# Patient Record
Sex: Female | Born: 1935 | Race: Black or African American | Hispanic: No | Marital: Single | Smoking: Never smoker
Health system: Southern US, Community
[De-identification: ages and names within clinical notes are randomized; demographics above are authoritative.]

## PROBLEM LIST (undated history)

## (undated) DIAGNOSIS — M109 Gout, unspecified: Secondary | ICD-10-CM

## (undated) DIAGNOSIS — M1990 Unspecified osteoarthritis, unspecified site: Secondary | ICD-10-CM

## (undated) DIAGNOSIS — K219 Gastro-esophageal reflux disease without esophagitis: Secondary | ICD-10-CM

## (undated) DIAGNOSIS — N289 Disorder of kidney and ureter, unspecified: Secondary | ICD-10-CM

## (undated) DIAGNOSIS — I10 Essential (primary) hypertension: Secondary | ICD-10-CM

## (undated) DIAGNOSIS — N189 Chronic kidney disease, unspecified: Secondary | ICD-10-CM

## (undated) HISTORY — PX: NO PAST SURGERIES: SHX2092

---

## 2004-05-02 ENCOUNTER — Ambulatory Visit: Payer: Self-pay | Admitting: Internal Medicine

## 2004-05-06 ENCOUNTER — Ambulatory Visit: Payer: Self-pay | Admitting: Internal Medicine

## 2006-04-10 ENCOUNTER — Ambulatory Visit: Payer: Self-pay | Admitting: Ophthalmology

## 2006-04-16 ENCOUNTER — Ambulatory Visit: Payer: Self-pay | Admitting: Ophthalmology

## 2009-01-05 ENCOUNTER — Emergency Department: Payer: Self-pay | Admitting: Emergency Medicine

## 2010-03-10 ENCOUNTER — Emergency Department: Payer: Self-pay | Admitting: Emergency Medicine

## 2015-11-04 ENCOUNTER — Emergency Department
Admission: EM | Admit: 2015-11-04 | Discharge: 2015-11-04 | Disposition: A | Discharge status: Home / Self Care | Payer: Medicare Other | Attending: Emergency Medicine | Admitting: Emergency Medicine

## 2015-11-04 DIAGNOSIS — M1990 Unspecified osteoarthritis, unspecified site: Secondary | ICD-10-CM | POA: Diagnosis not present

## 2015-11-04 DIAGNOSIS — T783XXA Angioneurotic edema, initial encounter: Secondary | ICD-10-CM | POA: Diagnosis not present

## 2015-11-04 DIAGNOSIS — Z79899 Other long term (current) drug therapy: Secondary | ICD-10-CM | POA: Insufficient documentation

## 2015-11-04 DIAGNOSIS — Z7982 Long term (current) use of aspirin: Secondary | ICD-10-CM | POA: Diagnosis not present

## 2015-11-04 DIAGNOSIS — I10 Essential (primary) hypertension: Secondary | ICD-10-CM | POA: Diagnosis not present

## 2015-11-04 DIAGNOSIS — R220 Localized swelling, mass and lump, head: Secondary | ICD-10-CM | POA: Diagnosis present

## 2015-11-04 HISTORY — DX: Essential (primary) hypertension: I10

## 2015-11-04 HISTORY — DX: Unspecified osteoarthritis, unspecified site: M19.90

## 2015-11-04 HISTORY — DX: Disorder of kidney and ureter, unspecified: N28.9

## 2015-11-04 HISTORY — DX: Gastro-esophageal reflux disease without esophagitis: K21.9

## 2015-11-04 HISTORY — DX: Gout, unspecified: M10.9

## 2015-11-04 LAB — CBC WITH DIFFERENTIAL/PLATELET
Basophils Absolute: 0 10*3/uL (ref 0–0.1)
Basophils Relative: 0 %
EOS ABS: 0.1 10*3/uL (ref 0–0.7)
Eosinophils Relative: 1 %
HEMATOCRIT: 44.9 % (ref 35.0–47.0)
HEMOGLOBIN: 14.4 g/dL (ref 12.0–16.0)
LYMPHS ABS: 2 10*3/uL (ref 1.0–3.6)
LYMPHS PCT: 16 %
MCH: 25.3 pg — AB (ref 26.0–34.0)
MCHC: 32.1 g/dL (ref 32.0–36.0)
MCV: 78.8 fL — AB (ref 80.0–100.0)
MONOS PCT: 6 %
Monocytes Absolute: 0.8 10*3/uL (ref 0.2–0.9)
NEUTROS ABS: 9.6 10*3/uL — AB (ref 1.4–6.5)
NEUTROS PCT: 77 %
Platelets: 272 10*3/uL (ref 150–440)
RBC: 5.7 MIL/uL — AB (ref 3.80–5.20)
RDW: 16.1 % — ABNORMAL HIGH (ref 11.5–14.5)
WBC: 12.6 10*3/uL — AB (ref 3.6–11.0)

## 2015-11-04 LAB — BASIC METABOLIC PANEL
Anion gap: 7 (ref 5–15)
BUN: 22 mg/dL — ABNORMAL HIGH (ref 6–20)
CHLORIDE: 106 mmol/L (ref 101–111)
CO2: 28 mmol/L (ref 22–32)
CREATININE: 1.29 mg/dL — AB (ref 0.44–1.00)
Calcium: 9.7 mg/dL (ref 8.9–10.3)
GFR calc non Af Amer: 35 mL/min — ABNORMAL LOW (ref >60)
GFR, EST AFRICAN AMERICAN: 40 mL/min — AB (ref >60)
Glucose, Bld: 106 mg/dL — ABNORMAL HIGH (ref 65–99)
POTASSIUM: 4.7 mmol/L (ref 3.5–5.1)
Sodium: 141 mmol/L (ref 135–145)

## 2015-11-04 MED ORDER — FAMOTIDINE IN NACL 20-0.9 MG/50ML-% IV SOLN
20.0000 mg | Freq: Once | INTRAVENOUS | Status: AC
  Administered 2015-11-04: 20 mg via INTRAVENOUS
  Filled 2015-11-04: qty 50

## 2015-11-04 MED ORDER — CLONIDINE HCL 0.1 MG PO TABS
0.2000 mg | ORAL_TABLET | Freq: Once | ORAL | Status: AC
  Administered 2015-11-04: 0.2 mg via ORAL
  Filled 2015-11-04: qty 2

## 2015-11-04 MED ORDER — DIPHENHYDRAMINE HCL 50 MG/ML IJ SOLN
25.0000 mg | Freq: Once | INTRAMUSCULAR | Status: AC
  Administered 2015-11-04: 25 mg via INTRAVENOUS
  Filled 2015-11-04: qty 1

## 2015-11-04 MED ORDER — AMLODIPINE BESYLATE 2.5 MG PO TABS: 2.5000 mg | ORAL_TABLET | Freq: Every day | ORAL | Status: DC

## 2015-11-04 MED ORDER — METHYLPREDNISOLONE SODIUM SUCC 125 MG IJ SOLR
60.0000 mg | Freq: Once | INTRAMUSCULAR | Status: AC
  Administered 2015-11-04: 60 mg via INTRAVENOUS
  Filled 2015-11-04: qty 2

## 2015-11-04 MED ORDER — AMLODIPINE BESYLATE 5 MG PO TABS
5.0000 mg | ORAL_TABLET | Freq: Once | ORAL | Status: AC
  Administered 2015-11-04: 5 mg via ORAL
  Filled 2015-11-04: qty 1

## 2015-11-04 NOTE — ED Notes (Signed)
After PEPCID infusion the swelling the the chin and cheeks has decreased slightly.  Patient still denies any pain. No airway changes noted

## 2015-11-04 NOTE — ED Notes (Signed)
Pt c/o swelling from below the eyes down into the chin since this morning.. Denies any tongue and throat swelling. Pt is on lisinopril..Marland Kitchen

## 2015-11-04 NOTE — Discharge Instructions (Signed)
You were seen in the ER today for swelling of the lower jaw. This improved with observation in the emergency room. This may be related to your lisinopril medication. Stop taking lisinopril. Start taking amlodipine 2.5 mg once a day to help control your blood pressure. Follow up closely with your primary care doctor for continued management of your blood pressure during this transition and further evaluation of your angioedema swelling.  Angioedema Angioedema is a sudden swelling of tissues, often of the skin. It can occur on the face or genitals or in the abdomen or other body parts. The swelling usually develops over a short period and gets better in 24 to 48 hours. It often begins during the night and is found when the person wakes up. The person may also get red, itchy patches of skin (hives). Angioedema can be dangerous if it involves swelling of the air passages.  Depending on the cause, episodes of angioedema may only happen once, come back in unpredictable patterns, or repeat for several years and then gradually fade away.  CAUSES  Angioedema can be caused by an allergic reaction to various triggers. It can also result from nonallergic causes, including reactions to drugs, immune system disorders, viral infections, or an abnormal gene that is passed to you from your parents (hereditary). For some people with angioedema, the cause is unknown.  Some things that can trigger angioedema include:   Foods.   Medicines, such as ACE inhibitors, ARBs, nonsteroidal anti-inflammatory agents, or estrogen.   Latex.   Animal saliva.   Insect stings.   Dyes used in X-rays.   Mild injury.   Dental work.  Surgery.  Stress.   Sudden changes in temperature.   Exercise. SIGNS AND SYMPTOMS   Swelling of the skin.  Hives. If these are present, there is also intense itching.  Redness in the affected area.   Pain in the affected area.  Swollen lips or tongue.  Breathing problems.  This may happen if the air passages swell.  Wheezing. If internal organs are involved, there may be:   Nausea.   Abdominal pain.   Vomiting.   Difficulty swallowing.   Difficulty passing urine. DIAGNOSIS   Your health care provider will examine the affected area and take a medical and family history.  Various tests may be done to help determine the cause. Tests may include:  Allergy skin tests to see if the problem is an allergic reaction.   Blood tests to check for hereditary angioedema.   Tests to check for underlying diseases that could cause the condition.   A review of your medicines, including over-the-counter medicines, may be done. TREATMENT  Treatment will depend on the cause of the angioedema. Possible treatments include:   Removal of anything that triggered the condition (such as stopping certain medicines).   Medicines to treat symptoms or prevent attacks. Medicines given may include:   Antihistamines.   Epinephrine injection.   Steroids.   Hospitalization may be required for severe attacks. If the air passages are affected, it can be an emergency. Tubes may need to be placed to keep the airway open. HOME CARE INSTRUCTIONS   Take all medicines as directed by your health care provider.  If you were given medicines for emergency allergy treatment, always carry them with you.  Wear a medical bracelet as directed by your health care provider.   Avoid known triggers. SEEK MEDICAL CARE IF:   You have repeat attacks of angioedema.   Your attacks are  more frequent or more severe despite preventive measures.   You have hereditary angioedema and are considering having children. It is important to discuss with your health care provider the risks of passing the condition on to your children. SEEK IMMEDIATE MEDICAL CARE IF:   You have severe swelling of the mouth, tongue, or lips.  You have difficulty breathing.   You have difficulty  swallowing.   You faint. MAKE SURE YOU:  Understand these instructions.  Will watch your condition.  Will get help right away if you are not doing well or get worse.   This information is not intended to replace advice given to you by your health care provider. Make sure you discuss any questions you have with your health care provider.   Document Released: 09/11/2001 Document Revised: 07/24/2014 Document Reviewed: 02/24/2013 Elsevier Interactive Patient Education [employer]

## 2015-11-04 NOTE — ED Provider Notes (Signed)
[facility] Emergency Department Provider Note  ____________________________________________  Time seen: 1:30 PM  I have reviewed the triage vital signs and the nursing notes.   HISTORY  Chief Complaint Facial Swelling    HPI [name] is a 82 y.o. female who comes to the emergency department due to swelling of her cheeks and jaw bilaterally. Patient reports that the swelling was present when she woke up this morning but not there when she went to sleep last night. He is had multiple episodes like this before of swelling in the bilateral jaw. Denies any swelling anywhere else in the body. She does light take lisinopril for her blood pressure. Denies knowing of any similar episodes in family members. Unable to pinpoint any precipitating event that may have caused this. Denies trouble swallowing, denies shortness of breath. No tongue swelling.     Past Medical History  Diagnosis Date  . Hypertension   . Gout   . GERD (gastroesophageal reflux disease)   . Osteoarthritis   . Renal disorder      There are no active problems to display for this patient.    History reviewed. No pertinent past surgical history.   Current Outpatient Rx  Name  Route  Sig  Dispense  Refill  . allopurinol (ZYLOPRIM) 100 MG tablet   Oral   Take 100 mg by mouth daily.         Marland Kitchen. aspirin 81 MG tablet   Oral   Take 81 mg by mouth daily.         . cloNIDine (CATAPRES) 0.2 MG tablet   Oral   Take 0.2 mg by mouth 3 (three) times daily.         . felodipine (PLENDIL) 10 MG 24 hr tablet   Oral   Take 10 mg by mouth daily.         . metoprolol (LOPRESSOR) 100 MG tablet   Oral   Take 100 mg by mouth 2 (two) times daily.         . QUEtiapine (SEROQUEL) 25 MG tablet   Oral   Take 25 mg by mouth 2 (two) times daily.         Marland Kitchen. amLODipine (NORVASC) 2.5 MG tablet   Oral   Take 1 tablet (2.5 mg total) by mouth daily.   30 tablet   0       Allergies Review of patient's allergies indicates no known allergies.   No family history on file.  Social History Social History  Substance Use Topics  . Smoking status: Never Smoker   . Smokeless tobacco: None  . Alcohol Use: No    Review of Systems  Constitutional:   No fever or chills.  Eyes:   No vision changes.  ENT:   No sore throat. No rhinorrhea.Positive facial swelling as above Cardiovascular:   No chest pain. Respiratory:   No dyspnea or cough. Gastrointestinal:   Negative for abdominal pain, vomiting and diarrhea.  No bloody stool. Genitourinary:   Negative for dysuria or difficulty urinating. Musculoskeletal:   Negative for focal pain or swelling Neurological:   Negative for headaches 10-point ROS otherwise negative.  ____________________________________________   PHYSICAL EXAM:  VITAL SIGNS: ED Triage Vitals  Enc Vitals Group     BP 11/04/15 1258 101/74 mmHg     Pulse Rate 11/04/15 1258 58     Resp 11/04/15 1258 14     Temp 11/04/15 1258 98.1 F (36.7 C)  Temp Source 11/04/15 1258 Oral     SpO2 11/04/15 1258 97 %     Weight 11/04/15 1258 104 lb (47.174 kg)     Height 11/04/15 1258  (1.499 m)     Head Cir --      Peak Flow --      Pain Score --      Pain Loc --      Pain Edu? --      Excl. in GC? --     Vital signs reviewed, nursing assessments reviewed.   Constitutional:   Alert and oriented. Well appearing and in no distress. Eyes:   No scleral icterus. No conjunctival pallor. PERRL. EOMI ENT   Head:   Normocephalic and atraumatic.There is firm edema of the skin overlying the mandible bilaterally extending to the chin. It does not extend inferior to the chin. Does not involve the maxillary or superiorly bilaterally.   Nose:   No congestion/rhinnorhea. No septal hematoma   Mouth/Throat:   MMM, no pharyngeal erythema. No peritonsillar mass. No tongue swelling. Oropharynx normal. No uvula swelling. Floor of mouth is  soft and not elevated or edematous. No palatal edema.   Neck:   No stridor. No SubQ emphysema. No meningismus. Hematological/Lymphatic/Immunilogical:   No cervical lymphadenopathy. Cardiovascular:   RRR. Symmetric bilateral radial and DP pulses.  No murmurs.  Respiratory:   Normal respiratory effort without tachypnea nor retractions. Breath sounds are clear and equal bilaterally. No wheezes/rales/rhonchi. Gastrointestinal:   Soft and nontender. Non distended. There is no CVA tenderness.  No rebound, rigidity, or guarding. Genitourinary:   deferred Musculoskeletal:   Nontender with normal range of motion in all extremities. No joint effusions.  No lower extremity tenderness.  No edema. Neurologic:   Normal speech and language.  CN 2-10 normal. Motor grossly intact. No gross focal neurologic deficits are appreciated.  Skin:    Skin is warm, dry and intact. No rash noted.  No petechiae, purpura, or bullae.  ____________________________________________    LABS (pertinent positives/negatives) (all labs ordered are listed, but only abnormal results are displayed) Labs Reviewed  BASIC METABOLIC PANEL - Abnormal; Notable for the following:    Glucose, Bld 106 (*)    BUN 22 (*)    Creatinine, Ser 1.29 (*)    GFR calc non Af Amer 35 (*)    GFR calc Af Amer 40 (*)    All other components within normal limits  CBC WITH DIFFERENTIAL/PLATELET - Abnormal; Notable for the following:    WBC 12.6 (*)    RBC 5.70 (*)    MCV 78.8 (*)    MCH 25.3 (*)    RDW 16.1 (*)    Neutro Abs 9.6 (*)    All other components within normal limits   ____________________________________________   EKG    ____________________________________________    RADIOLOGY    ____________________________________________   PROCEDURES   ____________________________________________   INITIAL IMPRESSION / ASSESSMENT AND PLAN / ED COURSE  Pertinent labs & imaging results that were available during my care  of the patient were reviewed by me and considered in my medical decision making (see chart for details).  Patient presents with angioedema so lower jaw. No involvement of the oral cavity or throat by symptoms or exam. Patient is overall well appearing and calm and comfortable. We'll observe in the emergency department, give steroids and antihistamines. Possibly related to lisinopril versus C1 esterase deficiency or other problem and deficiency.  On reassessment at 4:30 PM, the  swelling has diminished significantly and is now soft. Patient feels much better as well. Counseled her to discontinue her lisinopril. I will replace this with a low-dose of amlodipine and have her follow up closely with primary care for continued management of blood pressure and further evaluation of her angioedema. She was markedly hypertensive with systolic blood pressure of about 210 in the emergency department. However, she takes clonidine 3 times a day at 8 AM 5 PM and 8 PM. With this schedule, it is not surprising that she would become very hypertensive in the middle of the day. We gave her amlodipine and clonidine in the ER. No evidence of acute adverse sequelae of the blood pressure.   ____________________________________________   FINAL CLINICAL IMPRESSION(S) / ED DIAGNOSES  Final diagnoses:  Angioedema, initial encounter       Portions of this note were generated with dragon dictation software. Dictation errors may occur despite best attempts at proofreading.   Sharman Cheek, MD 11/04/15 [phone]

## 2015-11-04 NOTE — ED Notes (Signed)
MD at bedside. 

## 2017-04-06 ENCOUNTER — Ambulatory Visit
Admission: RE | Admit: 2017-04-06 | Discharge: 2017-04-06 | Disposition: A | Discharge status: Home / Self Care | Payer: Medicare Other | Source: Ambulatory Visit | Attending: Surgery | Admitting: Surgery

## 2017-04-06 ENCOUNTER — Other Ambulatory Visit: Payer: Self-pay | Admitting: Surgery

## 2017-04-06 ENCOUNTER — Encounter: Payer: Medicare Other | Attending: Surgery | Admitting: Surgery

## 2017-04-06 DIAGNOSIS — X58XXXA Exposure to other specified factors, initial encounter: Secondary | ICD-10-CM | POA: Insufficient documentation

## 2017-04-06 DIAGNOSIS — B999 Unspecified infectious disease: Secondary | ICD-10-CM

## 2017-04-06 DIAGNOSIS — I739 Peripheral vascular disease, unspecified: Secondary | ICD-10-CM | POA: Diagnosis not present

## 2017-04-06 DIAGNOSIS — S91301A Unspecified open wound, right foot, initial encounter: Secondary | ICD-10-CM | POA: Diagnosis not present

## 2017-04-06 DIAGNOSIS — I10 Essential (primary) hypertension: Secondary | ICD-10-CM | POA: Diagnosis not present

## 2017-04-06 DIAGNOSIS — L97512 Non-pressure chronic ulcer of other part of right foot with fat layer exposed: Secondary | ICD-10-CM | POA: Diagnosis not present

## 2017-04-08 NOTE — Progress Notes (Signed)
[name], [name] ([id number]) Visit Report for 04/06/2017 Chief Complaint Document Details Patient Name: [name], [name] Date of Service: 04/06/2017 8:00 AM Medical Record Number: [id number] Patient Account Number: [id number] Date of Birth/Sex: [birth year] (83 y.o. Female) Treating RN: Curtis Sites Primary Care Provider: Dewaine Oats Other Clinician: Referring Provider: CLINE, TODD Treating Provider/Extender: Rudene Re in Treatment: 0 Information Obtained from: Patient Chief Complaint Patient seen for complaints of Non-Healing Wound to the right second toe which she's had for about 3 months Electronic Signature(s) Signed: 04/06/2017 4:09:30 PM By: Evlyn Kanner MD, FACS Entered By: Evlyn Kanner on 04/06/2017 09:10:43 Loren, Erlean L. ([id number]) -------------------------------------------------------------------------------- HPI Details Patient Name: Moraes, Elonda L. Date of Service: 04/06/2017 8:00 AM Medical Record Number: [id number] Patient Account Number: [id number] Date of Birth/Sex: [birth year] (83 y.o. Female) Treating RN: Curtis Sites Primary Care Provider: Dewaine Oats Other Clinician: Referring Provider: CLINE, TODD Treating Provider/Extender: Rudene Re in Treatment: 0 History of Present Illness Location: right second toe Quality: Patient reports No Pain. Severity: Patient states wound are getting worse. Duration: Patient has had the wound for > 3 months prior to seeking treatment at the wound center Context: The wound would happen gradually Modifying Factors: Other treatment(s) tried include:asked her to wash the foot, soak it in Epsom salt and apply some Bactroban and offload it. HPI Description: very pleasant 83 year old patient, no history of diabetes or smoking has had a ulceration on the right second toe dorsum for about 3 months. Besides essential hypertension and gout she does not have any other significant problems and has never been a  smoker. Electronic Signature(s) Signed: 04/06/2017 4:09:30 PM By: Evlyn Kanner MD, FACS Entered By: Evlyn Kanner on 04/06/2017 09:12:32 Eagleson, [name] ([id number]) -------------------------------------------------------------------------------- Physical Exam Details Patient Name: Futrell, Ketsia L. Date of Service: 04/06/2017 8:00 AM Medical Record Number: [id number] Patient Account Number: [id number] Date of Birth/Sex: Jan 27, 1921 (83 y.o. Female) Treating RN: Curtis Sites Primary Care Provider: Dewaine Oats Other Clinician: Referring Provider: CLINE, TODD Treating Provider/Extender: Rudene Re in Treatment: 0 Constitutional . Pulse regular. Respirations normal and unlabored. Afebrile. . Eyes Nonicteric. Reactive to light. Ears, Nose, Mouth, and Throat Lips, teeth, and gums WNL.Marland Kitchen Moist mucosa without lesions. Neck supple and nontender. No palpable supraclavicular or cervical adenopathy. Normal sized without goiter. Respiratory WNL. No retractions.. Cardiovascular Pedal Pulses week and no ABIs were measurable.. No clubbing, cyanosis or edema. Gastrointestinal (GI) Abdomen without masses or tenderness.. No liver or spleen enlargement or tenderness.. Lymphatic No adneopathy. No adenopathy. No adenopathy. Musculoskeletal Adexa without tenderness or enlargement.. Digits and nails w/o clubbing, cyanosis, infection, petechiae, ischemia, or inflammatory conditions.. Integumentary (Hair, Skin) No suspicious lesions. No crepitus or fluctuance. No peri-wound warmth or erythema. No masses.Marland Kitchen Psychiatric Judgement and insight Intact.. No evidence of depression, anxiety, or agitation.. Notes the patient has no evidence of cellulitis of her foot or toes and has a small open wound in the region of the dorsum of the second toe which is a bit disfigured. No sharp debridement was required and it does not probe down to bone Electronic Signature(s) Signed: 04/06/2017 4:09:30 PM By: Evlyn Kanner MD, FACS Entered By: Evlyn Kanner on 04/06/2017 09:13:36 Posey, [name] ([id number]) -------------------------------------------------------------------------------- Physician Orders Details Patient Name: Ayer, Layne L. Date of Service: 04/06/2017 8:00 AM Medical Record Number: [id number] Patient Account Number: [id number] Date of Birth/Sex: 07-Apr-1921 (83 y.o. Female) Treating RN: Curtis Sites Primary Care Provider: Dewaine Oats Other Clinician: Referring Provider: CLINE, TODD Treating Provider/Extender: Rudene Re in Treatment: 0  Verbal / Phone Orders: No Diagnosis Coding ICD-10 Coding Code Description L97.512 Non-pressure chronic ulcer of other part of right foot with fat layer exposed I73.9 Peripheral vascular disease, unspecified I10 Essential (primary) hypertension Wound Cleansing Wound #1 Right Toe Second o Clean wound with Normal Saline. Anesthetic Wound #1 Right Toe Second o Topical Lidocaine 4% cream applied to wound bed prior to debridement Primary Wound Dressing Wound #1 Right Toe Second o Prisma Ag Secondary Dressing Wound #1 Right Toe Second o Gauze and Kerlix/Conform - secure lightly with tape or coban Dressing Change Frequency Wound #1 Right Toe Second o Other: - change the bandage on shower days or if soiled Follow-up Appointments Wound #1 Right Toe Second o Return Appointment in 1 week. Off-Loading Wound #1 Right Toe Second o Other: - continue wearing shoes that do not rub the top of the toe Yeater, Veronika L. ([id number]) Additional Orders / Instructions Wound #1 Right Toe Second o Increase protein intake. o Other: - Please add vitamin A, vitamin C and zinc supplements to your diet Radiology o X-ray, foot - right foot Electronic Signature(s) Signed: 04/06/2017 4:09:30 PM By: Evlyn Kanner MD, FACS Signed: 04/06/2017 5:20:28 PM By: Curtis Sites Entered By: Curtis Sites on 04/06/2017 09:12:45 Aoun, Florice L.  ([id number]) -------------------------------------------------------------------------------- Problem List Details Patient Name: Bebo, Raivyn L. Date of Service: 04/06/2017 8:00 AM Medical Record Number: [id number] Patient Account Number: [id number] Date of Birth/Sex: 1921/01/07 (83 y.o. Female) Treating RN: Curtis Sites Primary Care Provider: Dewaine Oats Other Clinician: Referring Provider: CLINE, TODD Treating Provider/Extender: Rudene Re in Treatment: 0 Active Problems ICD-10 Encounter Code Description Active Date Diagnosis L97.512 Non-pressure chronic ulcer of other part of right foot with 04/06/2017 Yes fat layer exposed I73.9 Peripheral vascular disease, unspecified 04/06/2017 Yes I10 Essential (primary) hypertension 04/06/2017 Yes Inactive Problems Resolved Problems Electronic Signature(s) Signed: 04/06/2017 4:09:30 PM By: Evlyn Kanner MD, FACS Entered By: Evlyn Kanner on 04/06/2017 09:10:06 Duke, Missie L. ([id number]) -------------------------------------------------------------------------------- Progress Note Details Patient Name: Bommarito, Elianys L. Date of Service: 04/06/2017 8:00 AM Medical Record Number: [id number] Patient Account Number: [id number] Date of Birth/Sex: [birth year] (83 y.o. Female) Treating RN: Curtis Sites Primary Care Provider: Dewaine Oats Other Clinician: Referring Provider: CLINE, TODD Treating Provider/Extender: Rudene Re in Treatment: 0 Subjective Chief Complaint Information obtained from Patient Patient seen for complaints of Non-Healing Wound to the right second toe which she's had for about 3 months History of Present Illness (HPI) The following HPI elements were documented for the patient's wound: Location: right second toe Quality: Patient reports No Pain. Severity: Patient states wound are getting worse. Duration: Patient has had the wound for > 3 months prior to seeking treatment at the wound center Context: The  wound would happen gradually Modifying Factors: Other treatment(s) tried include:asked her to wash the foot, soak it in Epsom salt and apply some Bactroban and offload it. very pleasant 83 year old patient, no history of diabetes or smoking has had a ulceration on the right second toe dorsum for about 3 months. Besides essential hypertension and gout she does not have any other significant problems and has never been a smoker. Wound History Patient presents with 1 open wound that has been present for approximately 3 months. Patient has been treating wound in the following manner: neosporin. Laboratory tests have not been performed in the last month. Patient reportedly has not tested positive for an antibiotic resistant organism. Patient reportedly has not tested positive for osteomyelitis. Patient reportedly has not had testing performed to evaluate circulation  in the legs. Patient History Information obtained from Caregiver. Allergies No Known Allergies Social History Never smoker, Marital Status - Widowed, Alcohol Use - Never, Drug Use - No History, Caffeine Use - Daily. LAKITA, SAHLIN ([id number]) Medical History Cardiovascular Patient has history of Hypertension Musculoskeletal Patient has history of Gout, Osteoarthritis Review of Systems (ROS) Constitutional Symptoms (General Health) The patient has no complaints or symptoms. Eyes Complains or has symptoms of Glasses / Contacts - glasses. Ear/Nose/Mouth/Throat The patient has no complaints or symptoms. Hematologic/Lymphatic The patient has no complaints or symptoms. Respiratory The patient has no complaints or symptoms. Gastrointestinal The patient has no complaints or symptoms. Endocrine The patient has no complaints or symptoms. Genitourinary Complains or has symptoms of Kidney failure/ Dialysis - renal disorder. Immunological The patient has no complaints or symptoms. Integumentary (Skin) The patient has no  complaints or symptoms. Neurologic The patient has no complaints or symptoms. Oncologic The patient has no complaints or symptoms. Psychiatric The patient has no complaints or symptoms. General Notes: no family history r/t patient not being able to answer questions - no diagnosis of dementia or psych diagnoses but patient takes seroquel Medications:patient takes felodipine ER 10 mg, allopurinol 100 mg, aspirin 81 mg, metoprolol 100 mg, clonidine 0.2 mg daily. Objective Constitutional Egerton, Zayanna L. ([id number]) Pulse regular. Respirations normal and unlabored. Afebrile. Vitals Time Taken: 8:28 AM, Temperature: 98.4 F, Pulse: 59 bpm, Respiratory Rate: 18 breaths/min, Blood Pressure: 191/49 mmHg. Eyes Nonicteric. Reactive to light. Ears, Nose, Mouth, and Throat Lips, teeth, and gums WNL.Marland Kitchen Moist mucosa without lesions. Neck supple and nontender. No palpable supraclavicular or cervical adenopathy. Normal sized without goiter. Respiratory WNL. No retractions.. Cardiovascular Pedal Pulses week and no ABIs were measurable.. No clubbing, cyanosis or edema. Gastrointestinal (GI) Abdomen without masses or tenderness.. No liver or spleen enlargement or tenderness.. Lymphatic No adneopathy. No adenopathy. No adenopathy. Musculoskeletal Adexa without tenderness or enlargement.. Digits and nails w/o clubbing, cyanosis, infection, petechiae, ischemia, or inflammatory conditions.Marland Kitchen Psychiatric Judgement and insight Intact.. No evidence of depression, anxiety, or agitation.. General Notes: the patient has no evidence of cellulitis of her foot or toes and has a small open wound in the region of the dorsum of the second toe which is a bit disfigured. No sharp debridement was required and it does not probe down to bone Integumentary (Hair, Skin) No suspicious lesions. No crepitus or fluctuance. No peri-wound warmth or erythema. No masses.. Wound #1 status is Open. Original cause of wound was  Gradually Appeared. The wound is located on the Right Toe Second. The wound measures 0.4cm length x 0.3cm width x 0.1cm depth; 0.094cm^2 area and 0.009cm^3 volume. There is Fat Layer (Subcutaneous Tissue) Exposed exposed. There is no tunneling or undermining noted. There is a medium amount of purulent drainage noted. The wound margin is flat and intact. There is small (1-33%) pink granulation within the wound bed. There is a large (67-100%) amount of necrotic tissue within the wound bed including Adherent Slough. The periwound skin appearance did not exhibit: Callus, Crepitus, Excoriation, Induration, Rash, Scarring, Dry/Scaly, Maceration, Atrophie Blanche, Cyanosis, Ecchymosis, Hemosiderin Staining, Mottled, Pallor, Rubor, Erythema. Periwound temperature Entsminger, Georgetta L. ([id number]) was noted as No Abnormality. The periwound has tenderness on palpation. Assessment Active Problems ICD-10 L97.512 - Non-pressure chronic ulcer of other part of right foot with fat layer exposed I73.9 - Peripheral vascular disease, unspecified I10 - Essential (primary) hypertension pleasant 83 year old with no medical problems except hypertension and gout has had a nonhealing wound on her  right second toe dorsum for about 3 months. After review today I have recommended: 1. Prisma AG with a bordered foam to be applied every other day 2. Offloading is been discussed in great detail 3. X-ray of the right foot with emphasis on the right toe 4. Adequate protein, vitamin A, vitamin C and zinc 5. We have discussed with her caregiver that at this stage I'm not asking for vascular studies but if the wound persisted in spite of good wound care remain need to do this. Plan Wound Cleansing: Wound #1 Right Toe Second: Clean wound with Normal Saline. Anesthetic: Wound #1 Right Toe Second: Topical Lidocaine 4% cream applied to wound bed prior to debridement Primary Wound Dressing: Wound #1 Right Toe Second: Prisma  Ag Secondary Dressing: Wound #1 Right Toe Second: Gauze and Kerlix/Conform - secure lightly with tape or coban Dressing Change Frequency: Wound #1 Right Toe Second: Other: - change the bandage on shower days or if soiled Follow-up Appointments: Wound #1 Right Toe Second: Return Appointment in 1 week. Off-Loading: Dejonge, Bethan L. ([id number]) Wound #1 Right Toe Second: Other: - continue wearing shoes that do not rub the top of the toe Additional Orders / Instructions: Wound #1 Right Toe Second: Increase protein intake. Other: - Please add vitamin A, vitamin C and zinc supplements to your diet Radiology ordered were: X-ray, foot - right foot pleasant 83 year old with no medical problems except hypertension and gout has had a nonhealing wound on her right second toe dorsum for about 3 months. After review today I have recommended: 1. Prisma AG with a bordered foam to be applied every other day 2. Offloading is been discussed in great detail 3. X-ray of the right foot with emphasis on the right toe 4. Adequate protein, vitamin A, vitamin C and zinc 5. We have discussed with her caregiver that at this stage I'm not asking for vascular studies but if the wound persisted in spite of good wound care remain need to do this. Electronic Signature(s) Signed: 04/06/2017 4:09:30 PM By: Evlyn Kanner MD, FACS Entered By: Evlyn Kanner on 04/06/2017 09:16:14 Arns, [name] ([id number]) -------------------------------------------------------------------------------- ROS/PFSH Details Patient Name: Clingan, Errika L. Date of Service: 04/06/2017 8:00 AM Medical Record Number: [id number] Patient Account Number: [id number] Date of Birth/Sex: 1921/07/15 (83 y.o. Female) Treating RN: Curtis Sites Primary Care Provider: TATE, Katherina Right Other Clinician: Referring Provider: CLINE, TODD Treating Provider/Extender: Rudene Re in Treatment: 0 Information Obtained From Caregiver Wound History Do you  currently have one or more open woundso Yes How many open wounds do you currently haveo 1 Approximately how long have you had your woundso 3 months How have you been treating your wound(s) until nowo neosporin Has your wound(s) ever healed and then re-openedo No Have you had any lab work done in the past montho No Have you tested positive for an antibiotic resistant organism (MRSA, VRE)o No Have you tested positive for osteomyelitis (bone infection)o No Have you had any tests for circulation on your legso No Eyes Complaints and Symptoms: Positive for: Glasses / Contacts - glasses Genitourinary Complaints and Symptoms: Positive for: Kidney failure/ Dialysis - renal disorder Constitutional Symptoms (General Health) Complaints and Symptoms: No Complaints or Symptoms Ear/Nose/Mouth/Throat Complaints and Symptoms: No Complaints or Symptoms Hematologic/Lymphatic Complaints and Symptoms: No Complaints or Symptoms Respiratory Complaints and Symptoms: No Complaints or Symptoms Luck, Kriss L. ([id number]) Cardiovascular Medical History: Positive for: Hypertension Gastrointestinal Complaints and Symptoms: No Complaints or Symptoms Endocrine Complaints and Symptoms: No Complaints or Symptoms Immunological  Complaints and Symptoms: No Complaints or Symptoms Integumentary (Skin) Complaints and Symptoms: No Complaints or Symptoms Musculoskeletal Medical History: Positive for: Gout; Osteoarthritis Neurologic Complaints and Symptoms: No Complaints or Symptoms Oncologic Complaints and Symptoms: No Complaints or Symptoms Psychiatric Complaints and Symptoms: No Complaints or Symptoms Immunizations Pneumococcal Vaccine: Received Pneumococcal Vaccination: Yes Immunization Notes: up to date Family and Social History CAMBELL, RICKENBACH ([id number]) Never smoker; Marital Status - Widowed; Alcohol Use: Never; Drug Use: No History; Caffeine Use: Daily; Financial Concerns: No; Food,  Clothing or Shelter Needs: No; Support System Lacking: No; Transportation Concerns: No; Advanced Directives: Yes (Not Provided); Patient does not want information on Advanced Directives; Medical Power of Attorney: Yes - Ardeen Jourdain - niece (Not Provided) Physician Affirmation I have reviewed and agree with the above information. Notes no family history r/t patient not being able to answer questions - no diagnosis of dementia or psych diagnoses but patient takes seroquel Electronic Signature(s) Signed: 04/06/2017 4:09:30 PM By: Evlyn Kanner MD, FACS Signed: 04/06/2017 5:20:28 PM By: Curtis Sites Entered By: Evlyn Kanner on 04/06/2017 08:50:17 Revolorio, Verdie L. ([id number]) -------------------------------------------------------------------------------- SuperBill Details Patient Name: Koopmann, Mesa L. Date of Service: 04/06/2017 Medical Record Number: [id number] Patient Account Number: [id number] Date of Birth/Sex: 1921/05/15 (83 y.o. Female) Treating RN: Curtis Sites Primary Care Provider: Dewaine Oats Other Clinician: Referring Provider: CLINE, TODD Treating Provider/Extender: Rudene Re in Treatment: 0 Diagnosis Coding ICD-10 Codes Code Description [phone] Non-pressure chronic ulcer of other part of right foot with fat layer exposed I73.9 Peripheral vascular disease, unspecified I10 Essential (primary) hypertension Facility Procedures CPT4 Code: [id number] Description: 99214 - WOUND CARE VISIT-LEV 4 EST PT Modifier: Quantity: 1 Physician Procedures CPT4 Code Description: [id number] - WC PHYS LEVEL 4 - NEW PT ICD-10 Description Diagnosis L97.512 Non-pressure chronic ulcer of other part of right fo I10 Essential (primary) hypertension I73.9 Peripheral vascular disease, unspecified Modifier: ot with fat lay Quantity: 1 er exposed Electronic Signature(s) Signed: 04/06/2017 5:06:49 PM By: Evlyn Kanner MD, FACS Signed: 04/06/2017 5:20:28 PM By: Curtis Sites Previous  Signature: 04/06/2017 4:09:30 PM Version By: Evlyn Kanner MD, FACS Entered By: Curtis Sites on 04/06/2017 16:37:25

## 2017-04-08 NOTE — Progress Notes (Signed)
[name], [name] ([id number]) Visit Report for 04/06/2017 Abuse/Suicide Risk Screen Details Patient Name: [name], [name] Date of Service: 04/06/2017 8:00 AM Medical Record Number: [id number] Patient Account Number: [id number] Date of Birth/Sex: 01/21/1921 (83 y.o. Female) Treating RN: Curtis Sites Primary Care Audre Cenci: Dewaine Oats Other Clinician: Referring Aidden Markovic: CLINE, TODD Treating Barnett Elzey/Extender: Rudene Re in Treatment: 0 Abuse/Suicide Risk Screen Items Answer ABUSE/SUICIDE RISK SCREEN: Has anyone close to you tried to hurt or harm you recentlyo No Do you feel uncomfortable with anyone in your familyo No Has anyone forced you do things that you didnot want to doo No Do you have any thoughts of harming yourselfo No Patient displays signs or symptoms of abuse and/or neglect. No Electronic Signature(s) Signed: 04/06/2017 5:20:28 PM By: Curtis Sites Entered By: Curtis Sites on 04/06/2017 08:24:47 Lepp, Kjirsten Elbert Ewings ([id number]) -------------------------------------------------------------------------------- Activities of Daily Living Details Patient Name: [name], [name] Date of Service: 04/06/2017 8:00 AM Medical Record Number: [id number] Patient Account Number: [id number] Date of Birth/Sex: [birth year] (83 y.o. Female) Treating RN: Curtis Sites Primary Care Demiana Crumbley: Dewaine Oats Other Clinician: Referring Scarlette Hogston: CLINE, TODD Treating Telsa Dillavou/Extender: Rudene Re in Treatment: 0 Activities of Daily Living Items Answer Activities of Daily Living (Please select one for each item) Drive Automobile Not Able Take Medications Need Assistance Use Telephone Need Assistance Care for Appearance Need Assistance Use Toilet Need Assistance Bath / Shower Need Assistance Dress Self Need Assistance Feed Self Need Assistance Walk Need Assistance Get In / Out Bed Need Assistance Housework Need Assistance Prepare Meals Need Assistance Handle Money Need  Assistance Shop for Self Need Assistance Electronic Signature(s) Signed: 04/06/2017 5:20:28 PM By: Curtis Sites Entered By: Curtis Sites on 04/06/2017 08:25:16 Vaccaro, [name] ([id number]) -------------------------------------------------------------------------------- Education Assessment Details Patient Name: [name] Fleet. Date of Service: 04/06/2017 8:00 AM Medical Record Number: [id number] Patient Account Number: [id number] Date of Birth/Sex: [birth year] (83 y.o. Female) Treating RN: Curtis Sites Primary Care Katrinna Travieso: Dewaine Oats Other Clinician: Referring Rudolph Daoust: CLINE, TODD Treating Olena Willy/Extender: Rudene Re in Treatment: 0 Primary Learner Assessed: Caregiver Reason Patient is not Primary Learner: wound location Learning Preferences/Education Level/Primary Language Learning Preference: Explanation, Demonstration Highest Education Level: College or Above Preferred Language: English Cognitive Barrier Assessment/Beliefs Language Barrier: No Translator Needed: No Memory Deficit: No Emotional Barrier: No Cultural/Religious Beliefs Affecting Medical No Care: Physical Barrier Assessment Impaired Vision: No Impaired Hearing: No Decreased Hand dexterity: No Knowledge/Comprehension Assessment Knowledge Level: Medium Comprehension Level: Medium Ability to understand written Medium instructions: Ability to understand verbal Medium instructions: Motivation Assessment Anxiety Level: Calm Cooperation: Cooperative Education Importance: Acknowledges Need Interest in Health Problems: Asks Questions Perception: Coherent Willingness to Engage in Self- Medium Management Activities: Readiness to Engage in Self- Medium Management Activities: TAMBRA, MULLER ([id number]) Electronic Signature(s) Signed: 04/06/2017 5:20:28 PM By: Curtis Sites Entered By: Curtis Sites on 04/06/2017 08:25:38 Trabucco, [name]  ([id number]) -------------------------------------------------------------------------------- Fall Risk Assessment Details Patient Name: [name], [name] Date of Service: 04/06/2017 8:00 AM Medical Record Number: [id number] Patient Account Number: [id number] Date of Birth/Sex: [birth year] (83 y.o. Female) Treating RN: Curtis Sites Primary Care Auriah Hollings: Dewaine Oats Other Clinician: Referring Uchechi Denison: CLINE, TODD Treating Niaomi Cartaya/Extender: Rudene Re in Treatment: 0 Fall Risk Assessment Items Have you had 2 or more falls in the last 12 monthso 0 No Have you had any fall that resulted in injury in the last 12 monthso 0 No FALL RISK ASSESSMENT: History of falling - immediate or within 3 months 0 No Secondary diagnosis 0 No Ambulatory aid  None/bed rest/wheelchair/nurse 0 No Crutches/cane/walker 15 Yes Furniture 0 No IV Access/Saline Lock 0 No Gait/Training Normal/bed rest/immobile 0 No Weak 10 Yes Impaired 0 No Mental Status Oriented to own ability 0 Yes Electronic Signature(s) Signed: 04/06/2017 5:20:28 PM By: Curtis Sites Entered By: Curtis Sites on 04/06/2017 08:25:55 Wieting, Lailani Elbert Ewings ([id number]) -------------------------------------------------------------------------------- Nutrition Risk Assessment Details Patient Name: [name], [name] Date of Service: 04/06/2017 8:00 AM Medical Record Number: [id number] Patient Account Number: [id number] Date of Birth/Sex: [birth year] (83 y.o. Female) Treating RN: Curtis Sites Primary Care Wylan Gentzler: Dewaine Oats Other Clinician: Referring Rufus Cypert: CLINE, TODD Treating Chevon Laufer/Extender: Rudene Re in Treatment: 0 Height (in): Weight (lbs): Body Mass Index (BMI): Nutrition Risk Assessment Items NUTRITION RISK SCREEN: I have an illness or condition that made me change the kind and/or 0 No amount of food I eat I eat fewer than two meals per day 0 No I eat few fruits and vegetables, or milk products 0 No I have  three or more drinks of beer, liquor or wine almost every day 0 No I have tooth or mouth problems that make it hard for me to eat 0 No I don't always have enough money to buy the food I need 0 No I eat alone most of the time 0 No I take three or more different prescribed or over-the-counter drugs a 1 Yes day Without wanting to, I have lost or gained 10 pounds in the last six 0 No months I am not always physically able to shop, cook and/or feed myself 0 No Nutrition Protocols Good Risk Protocol 0 No interventions needed Moderate Risk Protocol Electronic Signature(s) Signed: 04/06/2017 5:20:28 PM By: Curtis Sites Entered By: Curtis Sites on 04/06/2017 08:26:00

## 2017-04-09 NOTE — Progress Notes (Signed)
CJ, BEECHER ([id number]) Visit Report for 04/06/2017 Allergy List Details Patient Name: Geers, BREIONA COUVILLON. Date of Service: 04/06/2017 8:00 AM Medical Record Number: [id number] Patient Account Number: [id number] Date of Birth/Sex: March 30, 1921 (83 y.o. Female) Treating RN: Curtis Sites Primary Care Wrigley Plasencia: Dewaine Oats Other Clinician: Referring Matthew Cina: CLINE, TODD Treating Jef Futch/Extender: Rudene Re in Treatment: 0 Allergies Active Allergies No Known Allergies Allergy Notes Electronic Signature(s) Signed: 04/06/2017 5:20:28 PM By: Curtis Sites Entered By: Curtis Sites on 04/06/2017 08:24:39 No, Nathania Elbert Ewings ([id number]) -------------------------------------------------------------------------------- Arrival Information Details Patient Name: Yam, Salinda L. Date of Service: 04/06/2017 8:00 AM Medical Record Number: [id number] Patient Account Number: [id number] Date of Birth/Sex: 1921/05/27 (83 y.o. Female) Treating RN: Curtis Sites Primary Care Rafiel Mecca: Dewaine Oats Other Clinician: Referring Sinai Mahany: CLINE, TODD Treating Jaime Grizzell/Extender: Rudene Re in Treatment: 0 Visit Information Patient Arrived: Ambulatory Arrival Time: 08:22 Accompanied By: staff Transfer Assistance: None Patient Identification Verified: Yes Secondary Verification Process Yes Completed: Patient Has Alerts: Yes Patient Alerts: unable to obtain ABI R/T inaudible pulses Electronic Signature(s) Signed: 04/06/2017 5:20:28 PM By: Curtis Sites Entered By: Curtis Sites on 04/06/2017 08:49:26 Apodaca, Lonette LMarland Kitchen ([id number]) -------------------------------------------------------------------------------- Clinic Level of Care Assessment Details Patient Name: [name]. Date of Service: 04/06/2017 8:00 AM Medical Record Number: [id number] Patient Account Number: [id number] Date of Birth/Sex: Sep 21, 1920 (83 y.o. Female) Treating RN: Curtis Sites Primary Care Kalynne Womac: Dewaine Oats Other Clinician: Referring Aleksander Edmiston: CLINE, TODD Treating Scheryl Sanborn/Extender: Rudene Re in Treatment: 0 Clinic Level of Care Assessment Items TOOL 2 Quantity Score  - Use when only an EandM is performed on the INITIAL visit 0 ASSESSMENTS - Nursing Assessment / Reassessment X - General Physical Exam (combine w/ comprehensive assessment (listed just 1 20 below) when performed on new pt. evals) X - Comprehensive Assessment (HX, ROS, Risk Assessments, Wounds Hx, etc.) 1 25 ASSESSMENTS - Wound and Skin Assessment / Reassessment X - Simple Wound Assessment / Reassessment - one wound 1 5  - Complex Wound Assessment / Reassessment - multiple wounds 0  - Dermatologic / Skin Assessment (not related to wound area) 0 ASSESSMENTS - Ostomy and/or Continence Assessment and Care  - Incontinence Assessment and Management 0  - Ostomy Care Assessment and Management (repouching, etc.) 0 PROCESS - Coordination of Care X - Simple Patient / Family Education for ongoing care 1 15  - Complex (extensive) Patient / Family Education for ongoing care 0 X - Staff obtains Chiropractor, Records, Test Results / Process Orders 1 10  - Staff telephones HHA, Nursing Homes / Clarify orders / etc 0  - Routine Transfer to another Facility (non-emergent condition) 0  - Routine Hospital Admission (non-emergent condition) 0 X - New Admissions / Manufacturing engineer / Ordering NPWT, Apligraf, etc. 1 15  - Emergency Hospital Admission (emergent condition) 0 X - Simple Discharge Coordination 1 10 Loewe, Shevon L. ([id number])  - Complex (extensive) Discharge Coordination 0 PROCESS - Special Needs  - Pediatric / Minor Patient Management 0  - Isolation Patient Management 0  - Hearing / Language / Visual special needs 0  - Assessment of Community assistance (transportation, D/C planning, etc.) 0  - Additional assistance / Altered mentation 0  - Support Surface(s) Assessment (bed,  cushion, seat, etc.) 0 INTERVENTIONS - Wound Cleansing / Measurement X - Wound Imaging (photographs - any number of wounds) 1 5  - Wound Tracing (instead of photographs) 0 X - Simple Wound Measurement - one wound 1 5  - Complex Wound Measurement - multiple wounds  0 X - Simple Wound Cleansing - one wound 1 5  - Complex Wound Cleansing - multiple wounds 0 INTERVENTIONS - Wound Dressings X - Small Wound Dressing one or multiple wounds 1 10  - Medium Wound Dressing one or multiple wounds 0  - Large Wound Dressing one or multiple wounds 0  - Application of Medications - injection 0 INTERVENTIONS - Miscellaneous  - External ear exam 0  - Specimen Collection (cultures, biopsies, blood, body fluids, etc.) 0  - Specimen(s) / Culture(s) sent or taken to Lab for analysis 0  - Patient Transfer (multiple staff / Michiel Sites Lift / Similar devices) 0  - Simple Staple / Suture removal (25 or less) 0  - Complex Staple / Suture removal (26 or more) 0 Quinto, Elenor L. ([id number])  - Hypo / Hyperglycemic Management (close monitor of Blood Glucose) 0 X - Ankle / Brachial Index (ABI) - do not check if billed separately 1 15 Has the patient been seen at the hospital within the last three years: Yes Total Score: 140 Level Of Care: New/Established - Level 4 Electronic Signature(s) Signed: 04/06/2017 5:20:28 PM By: Curtis Sites Entered By: Curtis Sites on 04/06/2017 16:37:17 Sopher, Sharen Elbert Ewings ([id number]) -------------------------------------------------------------------------------- Encounter Discharge Information Details Patient Name: Steinhoff, Mickayla L. Date of Service: 04/06/2017 8:00 AM Medical Record Number: [id number] Patient Account Number: [id number] Date of Birth/Sex: 1921/04/10 (83 y.o. Female) Treating RN: Curtis Sites Primary Care Adelma Bowdoin: Dewaine Oats Other Clinician: Referring Chellsie Gomer: CLINE, TODD Treating Korri Ask/Extender: Rudene Re in Treatment:  0 Encounter Discharge Information Items Discharge Pain Level: 0 Discharge Condition: Stable Ambulatory Status: Ambulatory Discharge Destination: Home Transportation: Private Auto Accompanied By: staff Schedule Follow-up Appointment: Yes Medication Reconciliation completed and provided to Patient/Care No Ziaire Bieser: Provided on Clinical Summary of Care: 04/06/2017 Form Type Recipient Paper Patient [facility] Electronic Signature(s) Signed: 04/09/2017 9:26:52 AM By: Gwenlyn Perking Entered By: Gwenlyn Perking on 04/06/2017 09:23:41 Pehl, Shandell L. ([id number]) -------------------------------------------------------------------------------- Lower Extremity Assessment Details Patient Name: Depner, Sawyer L. Date of Service: 04/06/2017 8:00 AM Medical Record Number: [id number] Patient Account Number: [id number] Date of Birth/Sex: 09-27-1920 (83 y.o. Female) Treating RN: Curtis Sites Primary Care Deidra Spease: TATE, Katherina Right Other Clinician: Referring Alnita Aybar: CLINE, TODD Treating Renzo Vincelette/Extender: Rudene Re in Treatment: 0 Edema Assessment Assessed: [Left: No] [Right: No] Edema: [Left: No] [Right: No] Vascular Assessment Pulses: Dorsalis Pedis Palpable: [Left:No] [Right:No] Doppler Audible: [Left:Inaudible] [Right:Inaudible] Posterior Tibial Palpable: [Left:No] [Right:No] Doppler Audible: [Left:Inaudible] [Right:Inaudible] Extremity colors, hair growth, and conditions: Extremity Color: [Left:Hyperpigmented] [Right:Hyperpigmented] Hair Growth on Extremity: [Left:No] [Right:No] Temperature of Extremity: [Left:Cool] [Right:Cool] Capillary Refill: [Left:> 3 seconds] [Right:> 3 seconds] Toe Nail Assessment Left: Right: Thick: Yes Yes Discolored: Yes Yes Deformed: Yes Yes Improper Length and Hygiene: No No Notes unable to obtain ABI R/T inaudible pulses Electronic Signature(s) Signed: 04/06/2017 5:20:28 PM By: Curtis Sites Entered By: Curtis Sites on 04/06/2017 08:48:50 Jeanbaptiste,  Nikala L. ([id number]) -------------------------------------------------------------------------------- Multi Wound Chart Details Patient Name: Reiber, Jayln L. Date of Service: 04/06/2017 8:00 AM Medical Record Number: [id number] Patient Account Number: [id number] Date of Birth/Sex: 29-Jul-1920 (83 y.o. Female) Treating RN: Curtis Sites Primary Care Leanndra Pember: Dewaine Oats Other Clinician: Referring Meziah Blasingame: CLINE, TODD Treating Valor Quaintance/Extender: Rudene Re in Treatment: 0 Vital Signs Height(in): Pulse(bpm): 59 Weight(lbs): Blood Pressure 191/49 (mmHg): Body Mass Index(BMI): Temperature(F): 98.4 Respiratory Rate 18 (breaths/min): Photos: [1:No Photos] [N/A:N/A] Wound Location: [1:Right Toe Second] [N/A:N/A] Wounding Event: [1:Gradually Appeared] [N/A:N/A] Primary Etiology: [1:Arterial Insufficiency Ulcer N/A] Comorbid History: [1:Hypertension, Gout, Osteoarthritis] [N/A:N/A] Date Acquired: [1:12/26/2016] [N/A:N/A]  Weeks of Treatment: [1:0] [N/A:N/A] Wound Status: [1:Open] [N/A:N/A] Pending Amputation on Yes [N/A:N/A] Presentation: Measurements L x W x D 0.4x0.3x0.1 [N/A:N/A] (cm) Area (cm) : [1:0.094] [N/A:N/A] Volume (cm) : [1:0.009] [N/A:N/A] Classification: [1:Full Thickness With Exposed Support Structures] [N/A:N/A] Exudate Amount: [1:Medium] [N/A:N/A] Exudate Type: [1:Purulent] [N/A:N/A] Exudate Color: [1:yellow, brown, green] [N/A:N/A] Wound Margin: [1:Flat and Intact] [N/A:N/A] Granulation Amount: [1:Small (1-33%)] [N/A:N/A] Granulation Quality: [1:Pink] [N/A:N/A] Necrotic Amount: [1:Large (67-100%)] [N/A:N/A] Exposed Structures: [1:Fat Layer (Subcutaneous N/A Tissue) Exposed: Yes Fascia: No Tendon: No Muscle: No] Joint: No Bone: No Epithelialization: None N/A N/A Periwound Skin Texture: Excoriation: No N/A N/A Induration: No Callus: No Crepitus: No Rash: No Scarring: No Periwound Skin Maceration: No N/A N/A Moisture: Dry/Scaly:  No Periwound Skin Color: Atrophie Blanche: No N/A N/A Cyanosis: No Ecchymosis: No Erythema: No Hemosiderin Staining: No Mottled: No Pallor: No Rubor: No Temperature: No Abnormality N/A N/A Tenderness on Yes N/A N/A Palpation: Wound Preparation: Ulcer Cleansing: N/A N/A Rinsed/Irrigated with Saline Topical Anesthetic Applied: Other: lidocaine 4% Treatment Notes Electronic Signature(s) Signed: 04/06/2017 4:09:30 PM By: Evlyn Kanner MD, FACS Entered By: Evlyn Kanner on 04/06/2017 09:10:13 Fano, [name] ([id number]) -------------------------------------------------------------------------------- Multi-Disciplinary Care Plan Details Patient Name: Heikes, Timya L. Date of Service: 04/06/2017 8:00 AM Medical Record Number: [id number] Patient Account Number: [id number] Date of Birth/Sex: September 06, 1920 (83 y.o. Female) Treating RN: Curtis Sites Primary Care Farooq Petrovich: Dewaine Oats Other Clinician: Referring Shyanna Klingel: CLINE, TODD Treating Sherlyn Ebbert/Extender: Rudene Re in Treatment: 0 Active Inactive ` Abuse / Safety / Falls / Self Care Management Nursing Diagnoses: Impaired physical mobility Goals: Patient will remain injury free related to falls Date Initiated: 04/06/2017 Target Resolution Date: 06/22/2017 Goal Status: Active Interventions: Assess fall risk on admission and as needed Notes: ` Orientation to the Wound Care Program Nursing Diagnoses: Knowledge deficit related to the wound healing center program Goals: Patient/caregiver will verbalize understanding of the Wound Healing Center Program Date Initiated: 04/06/2017 Target Resolution Date: 06/22/2017 Goal Status: Active Interventions: Provide education on orientation to the wound center Notes: ` Wound/Skin Impairment Nursing Diagnoses: Impaired tissue integrity Barnhard, Duru L. ([id number]) Goals: Ulcer/skin breakdown will heal within 14 weeks Date Initiated: 04/06/2017 Target Resolution Date:  06/22/2017 Goal Status: Active Interventions: Assess patient/caregiver ability to obtain necessary supplies Assess patient/caregiver ability to perform ulcer/skin care regimen upon admission and as needed Assess ulceration(s) every visit Notes: Electronic Signature(s) Signed: 04/06/2017 5:20:28 PM By: Curtis Sites Entered By: Curtis Sites on 04/06/2017 09:00:35 Maland, [name] ([id number]) -------------------------------------------------------------------------------- Pain Assessment Details Patient Name: Eastham, Tyna L. Date of Service: 04/06/2017 8:00 AM Medical Record Number: [id number] Patient Account Number: [id number] Date of Birth/Sex: July 06, 1921 (83 y.o. Female) Treating RN: Curtis Sites Primary Care Fabrizzio Marcella: Dewaine Oats Other Clinician: Referring Lavayah [name]: CLINE, TODD Treating Haskel Dewalt/Extender: Rudene Re in Treatment: 0 Active Problems Location of Pain Severity and Description of Pain Patient Has Paino No Site Locations Pain Management and Medication Current Pain Management: Notes Topical or injectable lidocaine is offered to patient for acute pain when surgical debridement is performed. If needed, Patient is instructed to use over the counter pain medication for the following 24-48 hours after debridement. Wound care MDs do not prescribed pain medications. Patient has chronic pain or uncontrolled pain. Patient has been instructed to make an appointment with their Primary Care Physician for pain management. Electronic Signature(s) Signed: 04/06/2017 5:20:28 PM By: Curtis Sites Entered By: Curtis Sites on 04/06/2017 08:23:50 Zeiner, [name] ([id number]) -------------------------------------------------------------------------------- Patient/Caregiver Education Details Patient Name: [name]. Date of Service:  04/06/2017 8:00 AM Medical Record Number: [id number] Patient Account Number: [id number] Date of Birth/Gender: August 10, 1920 (83 y.o.  Female) Treating RN: Curtis Sites Primary Care Physician: Dewaine Oats Other Clinician: Referring Physician: CLINE, TODD Treating Physician/Extender: Rudene Re in Treatment: 0 Education Assessment Education Provided To: Patient and Caregiver Education Topics Provided Wound/Skin Impairment: Handouts: Other: wound care as ordered Methods: Demonstration, Explain/Verbal Responses: State content correctly Electronic Signature(s) Signed: 04/06/2017 5:20:28 PM By: Curtis Sites Entered By: Curtis Sites on 04/06/2017 09:02:54 Santa, Munirah L. ([id number]) -------------------------------------------------------------------------------- Wound Assessment Details Patient Name: Valliant, Rin L. Date of Service: 04/06/2017 8:00 AM Medical Record Number: [id number] Patient Account Number: [id number] Date of Birth/Sex: [birth year] (83 y.o. Female) Treating RN: Curtis Sites Primary Care Tona Qualley: Dewaine Oats Other Clinician: Referring Shante Maysonet: CLINE, TODD Treating Waleed Dettman/Extender: Rudene Re in Treatment: 0 Wound Status Wound Number: 1 Primary Etiology: Arterial Insufficiency Ulcer Wound Location: Right Toe Second Wound Status: Open Wounding Event: Gradually Appeared Comorbid History: Hypertension, Gout, Osteoarthritis Date Acquired: 12/26/2016 Weeks Of Treatment: 0 Clustered Wound: No Pending Amputation On Presentation Photos Photo Uploaded By: Curtis Sites on 04/09/2017 08:09:43 Wound Measurements Length: (cm) 0.4 Width: (cm) 0.3 Depth: (cm) 0.1 Area: (cm) 0.094 Volume: (cm) 0.009 % Reduction in Area: % Reduction in Volume: Epithelialization: None Tunneling: No Undermining: No Wound Description Full Thickness With Exposed Classification: Support Structures Wound Margin: Flat and Intact Exudate Medium Amount: Exudate Type: Purulent Exudate Color: yellow, brown, green Foul Odor After Cleansing: No Slough/Fibrino Yes Wound Bed Granulation  Amount: Small (1-33%) Exposed Structure Granulation Quality: Pink Fascia Exposed: No Ringel, Alika L. ([id number]) Necrotic Amount: Large (67-100%) Fat Layer (Subcutaneous Tissue) Exposed: Yes Necrotic Quality: Adherent Slough Tendon Exposed: No Muscle Exposed: No Joint Exposed: No Bone Exposed: No Periwound Skin Texture Texture Color No Abnormalities Noted: No No Abnormalities Noted: No Callus: No Atrophie Blanche: No Crepitus: No Cyanosis: No Excoriation: No Ecchymosis: No Induration: No Erythema: No Rash: No Hemosiderin Staining: No Scarring: No Mottled: No Pallor: No Moisture Rubor: No No Abnormalities Noted: No Dry / Scaly: No Temperature / Pain Maceration: No Temperature: No Abnormality Tenderness on Palpation: Yes Wound Preparation Ulcer Cleansing: Rinsed/Irrigated with Saline Topical Anesthetic Applied: Other: lidocaine 4%, Treatment Notes Wound #1 (Right Toe Second) 1. Cleansed with: Clean wound with Normal Saline 2. Anesthetic Topical Lidocaine 4% cream to wound bed prior to debridement 4. Dressing Applied: Prisma Ag 5. Secondary Dressing Applied Dry Gauze Kerlix/Conform Electronic Signature(s) Signed: 04/06/2017 5:20:28 PM By: Curtis Sites Entered By: Curtis Sites on 04/06/2017 08:42:24 Krabill, [name] ([id number]) -------------------------------------------------------------------------------- Vitals Details Patient Name: Swearengin, Wiletta L. Date of Service: 04/06/2017 8:00 AM Medical Record Number: [id number] Patient Account Number: [id number] Date of Birth/Sex: [birth year] (83 y.o. Female) Treating RN: Curtis Sites Primary Care Kallon Caylor: Dewaine Oats Other Clinician: Referring Edmon Magid: CLINE, TODD Treating Johanan Skorupski/Extender: Rudene Re in Treatment: 0 Vital Signs Time Taken: 08:28 Temperature (F): 98.4 Pulse (bpm): 59 Respiratory Rate (breaths/min): 18 Blood Pressure (mmHg): 191/49 Reference Range: 80 - 120 mg / dl Electronic  Signature(s) Signed: 04/06/2017 5:20:28 PM By: Curtis Sites Entered By: Curtis Sites on 04/06/2017 08:28:20

## 2017-04-13 ENCOUNTER — Encounter: Payer: Medicare Other | Admitting: Physician Assistant

## 2017-04-13 DIAGNOSIS — L97512 Non-pressure chronic ulcer of other part of right foot with fat layer exposed: Secondary | ICD-10-CM | POA: Diagnosis not present

## 2017-04-15 NOTE — Progress Notes (Signed)
[name] ([id number]) Visit Report for 04/13/2017 Chief Complaint Document Details Patient Name: [name], [name] Date of Service: 04/13/2017 11:00 AM Medical Record Number: [id number] Patient Account Number: [id number] Date of Birth/Sex: 1921-06-05 (83 y.o. Female) Treating RN: Curtis Sites Primary Care Provider: Dewaine Oats Other Clinician: Referring Provider: Dewaine Oats Treating Provider/Extender: Linwood Dibbles, HOYT Weeks in Treatment: 1 Information Obtained from: Patient Chief Complaint Patient seen for complaints of Non-Healing Wound to the right second toe Electronic Signature(s) Signed: 04/13/2017 5:13:37 PM By: Lenda Kelp PA-C Entered By: Lenda Kelp on 04/13/2017 11:24:38 [place], Renda L. ([id number]) -------------------------------------------------------------------------------- HPI Details Patient Name: [name] Bayley L. Date of Service: 04/13/2017 11:00 AM Medical Record Number: [id number] Patient Account Number: [id number] Date of Birth/Sex: 1920-08-19 (83 y.o. Female) Treating RN: Curtis Sites Primary Care Provider: Dewaine Oats Other Clinician: Referring Provider: Dewaine Oats Treating Provider/Extender: STONE III, HOYT Weeks in Treatment: 1 History of Present Illness Location: right second toe Quality: Patient reports No Pain. Severity: Patient states wound are getting worse. Duration: Patient has had the wound for > 3 months prior to seeking treatment at the wound center Context: The wound would happen gradually Modifying Factors: Other treatment(s) tried include:asked her to wash the foot, soak it in Epsom salt and apply some Bactroban and offload it. HPI Description: very pleasant 83 year old patient, no history of diabetes or smoking has had a ulceration on the right second toe dorsum for about 3 months. Besides essential hypertension and gout she does not have any other significant problems and has never been a smoker. 04/13/17 on evaluation today  patient appears to be doing about the same in regard to her right second toe ulcer. We did get the results for x-ray which showed osteopenia without any specific bone abnormality osteomyelitis. She does continue to have discomfort with palpation of the wound although secondary to mental status is unable to rate or describe this pain. No fevers, chills, nausea, or vomiting noted at this time. Electronic Signature(s) Signed: 04/13/2017 5:13:37 PM By: Lenda Kelp PA-C Entered By: Lenda Kelp on 04/13/2017 12:01:35 Keegan, [name] ([id number]) -------------------------------------------------------------------------------- Physical Exam Details Patient Name: Wheat, [name] Date of Service: 04/13/2017 11:00 AM Medical Record Number: [id number] Patient Account Number: [id number] Date of Birth/Sex: 12-29-1920 (83 y.o. Female) Treating RN: Curtis Sites Primary Care Provider: Dewaine Oats Other Clinician: Referring Provider: TATE, Katherina Right Treating Provider/Extender: STONE III, HOYT Weeks in Treatment: 1 Constitutional Thin and well-hydrated in no acute distress. Respiratory normal breathing without difficulty. clear to auscultation bilaterally. Cardiovascular regular rate and rhythm with normal S1, S2. Psychiatric Patient is not able to cooperate in decision making regarding care. Patient has dementia. pleasant and cooperative. Notes No debridement was performed today due to this patient's ABIs. I did however order arterial studies and on evaluation of her wound it was noted that there is bone exposure. This is right at the distal joint line. Electronic Signature(s) Signed: 04/13/2017 5:13:37 PM By: Lenda Kelp PA-C Entered By: Lenda Kelp on 04/13/2017 12:02:30 [place], [name] ([id number]) -------------------------------------------------------------------------------- Physician Orders Details Patient Name: [name] Date of Service: 04/13/2017 11:00 AM Medical Record  Number: [id number] Patient Account Number: [id number] Date of Birth/Sex: 09-Aug-1920 (83 y.o. Female) Treating RN: Curtis Sites Primary Care Provider: Dewaine Oats Other Clinician: Referring Provider: Dewaine Oats Treating Provider/Extender: Linwood Dibbles, HOYT Weeks in Treatment: 1 Verbal / Phone Orders: No Diagnosis Coding ICD-10 Coding Code Description L97.512 Non-pressure chronic ulcer of other part of right foot with fat  layer exposed I73.9 Peripheral vascular disease, unspecified I10 Essential (primary) hypertension Wound Cleansing Wound #1 Right Toe Second o Clean wound with Normal Saline. Anesthetic Wound #1 Right Toe Second o Topical Lidocaine 4% cream applied to wound bed prior to debridement Primary Wound Dressing Wound #1 Right Toe Second o Prisma Ag Secondary Dressing Wound #1 Right Toe Second o Gauze and Kerlix/Conform - secure lightly with tape or coban Dressing Change Frequency Wound #1 Right Toe Second o Other: - change the bandage on shower days or if soiled Follow-up Appointments Wound #1 Right Toe Second o Return Appointment in 1 week. Off-Loading Wound #1 Right Toe Second o Other: - continue wearing shoes that do not rub the top of the toe Cornfield, Erykah L. ([id number]) Additional Orders / Instructions Wound #1 Right Toe Second o Increase protein intake. o Other: - Please add vitamin A, vitamin C and zinc supplements to your diet Services and Therapies o Arterial Studies- Bilateral Electronic Signature(s) Signed: 04/13/2017 4:34:39 PM By: Curtis Sites Signed: 04/13/2017 5:13:37 PM By: Lenda Kelp PA-C Entered By: Curtis Sites on 04/13/2017 11:32:11 Oglesby, Monike L. ([id number]) -------------------------------------------------------------------------------- Problem List Details Patient Name: [name], [name] Date of Service: 04/13/2017 11:00 AM Medical Record Number: [id number] Patient Account Number: [id number] Date of Birth/Sex:  [birth year] (83 y.o. Female) Treating RN: Curtis Sites Primary Care Provider: Dewaine Oats Other Clinician: Referring Provider: Dewaine Oats Treating Provider/Extender: Linwood Dibbles, HOYT Weeks in Treatment: 1 Active Problems ICD-10 Encounter Code Description Active Date Diagnosis L97.512 Non-pressure chronic ulcer of other part of right foot with 04/06/2017 Yes fat layer exposed I73.9 Peripheral vascular disease, unspecified 04/06/2017 Yes I10 Essential (primary) hypertension 04/06/2017 Yes Inactive Problems Resolved Problems Electronic Signature(s) Signed: 04/13/2017 5:13:37 PM By: Lenda Kelp PA-C Entered By: Lenda Kelp on 04/13/2017 11:24:16 Denz, Eurydice L. ([id number]) -------------------------------------------------------------------------------- Progress Note Details Patient Name: Soulier, Meshawn L. Date of Service: 04/13/2017 11:00 AM Medical Record Number: [id number] Patient Account Number: [id number] Date of Birth/Sex: 10/26/1920 (83 y.o. Female) Treating RN: Curtis Sites Primary Care Provider: Dewaine Oats Other Clinician: Referring Provider: TATE, Katherina Right Treating Provider/Extender: Linwood Dibbles, HOYT Weeks in Treatment: 1 Subjective Chief Complaint Information obtained from Patient Patient seen for complaints of Non-Healing Wound to the right second toe History of Present Illness (HPI) The following HPI elements were documented for the patient's wound: Location: right second toe Quality: Patient reports No Pain. Severity: Patient states wound are getting worse. Duration: Patient has had the wound for > 3 months prior to seeking treatment at the wound center Context: The wound would happen gradually Modifying Factors: Other treatment(s) tried include:asked her to wash the foot, soak it in Epsom salt and apply some Bactroban and offload it. very pleasant 83 year old patient, no history of diabetes or smoking has had a ulceration on the right second toe dorsum for  about 3 months. Besides essential hypertension and gout she does not have any other significant problems and has never been a smoker. 04/13/17 on evaluation today patient appears to be doing about the same in regard to her right second toe ulcer. We did get the results for x-ray which showed osteopenia without any specific bone abnormality osteomyelitis. She does continue to have discomfort with palpation of the wound although secondary to mental status is unable to rate or describe this pain. No fevers, chills, nausea, or vomiting noted at this time. Objective Constitutional Thin and well-hydrated in no acute distress. Vitals Time Taken: 11:07 AM, Temperature: 97.8 F, Pulse: 51 bpm, Respiratory Rate:  18 breaths/min, [place], Darcy L. ([id number]) Blood Pressure: 137/52 mmHg. Respiratory normal breathing without difficulty. clear to auscultation bilaterally. Cardiovascular regular rate and rhythm with normal S1, S2. Psychiatric Patient is not able to cooperate in decision making regarding care. Patient has dementia. pleasant and cooperative. General Notes: No debridement was performed today due to this patient's ABIs. I did however order arterial studies and on evaluation of her wound it was noted that there is bone exposure. This is right at the distal joint line. Integumentary (Hair, Skin) Wound #1 status is Open. Original cause of wound was Gradually Appeared. The wound is located on the Right Toe Second. The wound measures 0.3cm length x 0.5cm width x 0.1cm depth; 0.118cm^2 area and 0.012cm^3 volume. There is bone and Fat Layer (Subcutaneous Tissue) Exposed exposed. There is no tunneling or undermining noted. There is a medium amount of purulent drainage noted. The wound margin is flat and intact. There is large (67-100%) pink granulation within the wound bed. There is a small (1-33%) amount of necrotic tissue within the wound bed including Adherent Slough. The periwound skin  appearance did not exhibit: Callus, Crepitus, Excoriation, Induration, Rash, Scarring, Dry/Scaly, Maceration, Atrophie Blanche, Cyanosis, Ecchymosis, Hemosiderin Staining, Mottled, Pallor, Rubor, Erythema. Periwound temperature was noted as No Abnormality. The periwound has tenderness on palpation. Assessment Active Problems ICD-10 L97.512 - Non-pressure chronic ulcer of other part of right foot with fat layer exposed I73.9 - Peripheral vascular disease, unspecified I10 - Essential (primary) hypertension Plan Wound Cleansing: Wound #1 Right Toe Second: Bauch, Rosary L. ([id number]) Clean wound with Normal Saline. Anesthetic: Wound #1 Right Toe Second: Topical Lidocaine 4% cream applied to wound bed prior to debridement Primary Wound Dressing: Wound #1 Right Toe Second: Prisma Ag Secondary Dressing: Wound #1 Right Toe Second: Gauze and Kerlix/Conform - secure lightly with tape or coban Dressing Change Frequency: Wound #1 Right Toe Second: Other: - change the bandage on shower days or if soiled Follow-up Appointments: Wound #1 Right Toe Second: Return Appointment in 1 week. Off-Loading: Wound #1 Right Toe Second: Other: - continue wearing shoes that do not rub the top of the toe Additional Orders / Instructions: Wound #1 Right Toe Second: Increase protein intake. Other: - Please add vitamin A, vitamin C and zinc supplements to your diet Services and Therapies ordered were: Arterial Studies- Bilateral We will continue with the collagen dressing per above for the next week. We will reevaluate and see were things stand actually in two weeks time. If anything worsened significantly in the interim patient will contact our office for additional recommendations. Otherwise we will see what her arterial studies have to say hopefully I follow-up. Electronic Signature(s) Signed: 04/13/2017 5:13:37 PM By: Lenda Kelp PA-C Entered By: Lenda Kelp on 04/13/2017 12:03:15 [place],  Quinette Elbert Ewings ([id number]) -------------------------------------------------------------------------------- SuperBill Details Patient Name: [name] Bayley L. Date of Service: 04/13/2017 Medical Record Number: [id number] Patient Account Number: [id number] Date of Birth/Sex: 1920/12/10 (83 y.o. Female) Treating RN: Curtis Sites Primary Care Provider: Dewaine Oats Other Clinician: Referring Provider: Dewaine Oats Treating Provider/Extender: STONE III, HOYT Weeks in Treatment: 1 Diagnosis Coding ICD-10 Codes Code Description L97.512 Non-pressure chronic ulcer of other part of right foot with fat layer exposed I73.9 Peripheral vascular disease, unspecified I10 Essential (primary) hypertension Facility Procedures CPT4 Code: [id number] Description: 99213 - WOUND CARE VISIT-LEV 3 EST PT Modifier: Quantity: 1 Physician Procedures CPT4 Code Description: [id number] 99214 - WC PHYS LEVEL 4 - EST PT ICD-10 Description Diagnosis L97.512 Non-pressure chronic ulcer of other  part of right fo I73.9 Peripheral vascular disease, unspecified I10 Essential (primary) hypertension Modifier: ot with fat lay Quantity: 1 er exposed Electronic Signature(s) Signed: 04/13/2017 5:13:37 PM By: Lenda Kelp PA-C Entered By: Lenda Kelp on 04/13/2017 12:03:37

## 2017-04-16 NOTE — Progress Notes (Signed)
MERSADIES, PETREE ([id number]) Visit Report for 04/13/2017 Arrival Information Details Patient Name: [name], [name] Date of Service: 04/13/2017 11:00 AM Medical Record Number: [id number] Patient Account Number: [id number] Date of Birth/Sex: 1921-03-23 (83 y.o. Female) Treating RN: Curtis Sites Primary Care Kenady Doxtater: Dewaine Oats Other Clinician: Referring Lyra Alaimo: Dewaine Oats Treating Emelia Sandoval/Extender: Linwood Dibbles, HOYT Weeks in Treatment: 1 Visit Information History Since Last Visit Added or deleted any medications: No Patient Arrived: Ambulatory Any new allergies or adverse reactions: No Arrival Time: 11:04 Had a fall or experienced change in No Accompanied By: staff activities of daily living that may affect Transfer Assistance: None risk of falls: Patient Identification Verified: Yes Signs or symptoms of abuse/neglect since last No Secondary Verification Process Yes visito Completed: Hospitalized since last visit: No Patient Has Alerts: Yes Has Dressing in Place as Prescribed: Yes Patient Alerts: unable to obtain Pain Present Now: No ABI R/T inaudible pulses Electronic Signature(s) Signed: 04/13/2017 4:34:39 PM By: Curtis Sites Entered By: Curtis Sites on 04/13/2017 11:07:20 Santiago, Atiana L. ([id number]) -------------------------------------------------------------------------------- Clinic Level of Care Assessment Details Patient Name: [name] Date of Service: 04/13/2017 11:00 AM Medical Record Number: [id number] Patient Account Number: [id number] Date of Birth/Sex: 23-Nov-1920 (83 y.o. Female) Treating RN: Curtis Sites Primary Care Kylen Schliep: TATE, Katherina Right Other Clinician: Referring Kiela Shisler: TATE, Katherina Right Treating Jaree Trinka/Extender: Linwood Dibbles, HOYT Weeks in Treatment: 1 Clinic Level of Care Assessment Items TOOL 4 Quantity Score  - Use when only an EandM is performed on FOLLOW-UP visit 0 ASSESSMENTS - Nursing Assessment / Reassessment X - Reassessment of  Co-morbidities (includes updates in patient status) 1 10 X - Reassessment of Adherence to Treatment Plan 1 5 ASSESSMENTS - Wound and Skin Assessment / Reassessment X - Simple Wound Assessment / Reassessment - one wound 1 5  - Complex Wound Assessment / Reassessment - multiple wounds 0  - Dermatologic / Skin Assessment (not related to wound area) 0 ASSESSMENTS - Focused Assessment  - Circumferential Edema Measurements - multi extremities 0  - Nutritional Assessment / Counseling / Intervention 0 X - Lower Extremity Assessment (monofilament, tuning fork, pulses) 1 5  - Peripheral Arterial Disease Assessment (using hand held doppler) 0 ASSESSMENTS - Ostomy and/or Continence Assessment and Care  - Incontinence Assessment and Management 0  - Ostomy Care Assessment and Management (repouching, etc.) 0 PROCESS - Coordination of Care X - Simple Patient / Family Education for ongoing care 1 15  - Complex (extensive) Patient / Family Education for ongoing care 0  - Staff obtains Chiropractor, Records, Test Results / Process Orders 0  - Staff telephones HHA, Nursing Homes / Clarify orders / etc 0  - Routine Transfer to another Facility (non-emergent condition) 0 Wild, Sharlena L. ([id number])  - Routine Hospital Admission (non-emergent condition) 0  - New Admissions / Manufacturing engineer / Ordering NPWT, Apligraf, etc. 0  - Emergency Hospital Admission (emergent condition) 0 X - Simple Discharge Coordination 1 10  - Complex (extensive) Discharge Coordination 0 PROCESS - Special Needs  - Pediatric / Minor Patient Management 0  - Isolation Patient Management 0  - Hearing / Language / Visual special needs 0  - Assessment of Community assistance (transportation, D/C planning, etc.) 0  - Additional assistance / Altered mentation 0  - Support Surface(s) Assessment (bed, cushion, seat, etc.) 0 INTERVENTIONS - Wound Cleansing / Measurement X - Simple Wound  Cleansing - one wound 1 5  - Complex Wound Cleansing - multiple wounds 0 X - Wound Imaging (photographs - any number  of wounds) 1 5  - Wound Tracing (instead of photographs) 0 X - Simple Wound Measurement - one wound 1 5  - Complex Wound Measurement - multiple wounds 0 INTERVENTIONS - Wound Dressings X - Small Wound Dressing one or multiple wounds 1 10  - Medium Wound Dressing one or multiple wounds 0  - Large Wound Dressing one or multiple wounds 0  - Application of Medications - topical 0  - Application of Medications - injection 0 INTERVENTIONS - Miscellaneous  - External ear exam 0 Twyford, Mickenzie L. ([id number])  - Specimen Collection (cultures, biopsies, blood, body fluids, etc.) 0  - Specimen(s) / Culture(s) sent or taken to Lab for analysis 0  - Patient Transfer (multiple staff / Michiel Sites Lift / Similar devices) 0  - Simple Staple / Suture removal (25 or less) 0  - Complex Staple / Suture removal (26 or more) 0  - Hypo / Hyperglycemic Management (close monitor of Blood Glucose) 0  - Ankle / Brachial Index (ABI) - do not check if billed separately 0 X - Vital Signs 1 5 Has the patient been seen at the hospital within the last three years: Yes Total Score: 80 Level Of Care: New/Established - Level 3 Electronic Signature(s) Signed: 04/13/2017 4:34:39 PM By: Curtis Sites Entered By: Curtis Sites on 04/13/2017 11:35:43 San, Kellsie L. ([id number]) -------------------------------------------------------------------------------- Encounter Discharge Information Details Patient Name: [name] Bayley L. Date of Service: 04/13/2017 11:00 AM Medical Record Number: [id number] Patient Account Number: [id number] Date of Birth/Sex: Mar 18, 1921 (83 y.o. Female) Treating RN: Curtis Sites Primary Care Shunda Rabadi: Dewaine Oats Other Clinician: Referring Wyland Rastetter: Dewaine Oats Treating Hamad Whyte/Extender: Linwood Dibbles, HOYT Weeks in Treatment: 1 Encounter Discharge  Information Items Discharge Pain Level: 0 Discharge Condition: Stable Ambulatory Status: Ambulatory Discharge Destination: Home Transportation: Private Auto Accompanied By: staff Schedule Follow-up Appointment: Yes Medication Reconciliation completed and provided to Patient/Care No Jasline Buskirk: Provided on Clinical Summary of Care: 04/13/2017 Form Type Recipient Paper Patient [facility] Electronic Signature(s) Signed: 04/16/2017 9:00:05 AM By: Gwenlyn Perking Entered By: Gwenlyn Perking on 04/13/2017 11:44:24 Winker, Paulena L. ([id number]) -------------------------------------------------------------------------------- Lower Extremity Assessment Details Patient Name: Delamar, Alyda L. Date of Service: 04/13/2017 11:00 AM Medical Record Number: [id number] Patient Account Number: [id number] Date of Birth/Sex: [birth year] (83 y.o. Female) Treating RN: Curtis Sites Primary Care Nezzie Manera: Dewaine Oats Other Clinician: Referring Jaelle Campanile: TATE, Katherina Right Treating Zollie Clemence/Extender: STONE III, HOYT Weeks in Treatment: 1 Vascular Assessment Pulses: Dorsalis Pedis Palpable: [Right:No] Doppler Audible: [Right:Inaudible] Posterior Tibial Extremity colors, hair growth, and conditions: Extremity Color: [Right:Hyperpigmented] Hair Growth on Extremity: [Right:No] Temperature of Extremity: [Right:Warm] Capillary Refill: [Right:> 3 seconds] Electronic Signature(s) Signed: 04/13/2017 4:34:39 PM By: Curtis Sites Entered By: Curtis Sites on 04/13/2017 11:13:32 Foot, Finesse L. ([id number]) -------------------------------------------------------------------------------- Multi Wound Chart Details Patient Name: Kromer, Saundra L. Date of Service: 04/13/2017 11:00 AM Medical Record Number: [id number] Patient Account Number: [id number] Date of Birth/Sex: 12/23/1920 (83 y.o. Female) Treating RN: Curtis Sites Primary Care Melanie Pellot: Dewaine Oats Other Clinician: Referring Gwendolyn Nishi: TATE, Katherina Right Treating  Talaya Lamprecht/Extender: STONE III, HOYT Weeks in Treatment: 1 Vital Signs Height(in): Pulse(bpm): 51 Weight(lbs): Blood Pressure 137/52 (mmHg): Body Mass Index(BMI): Temperature(F): 97.8 Respiratory Rate 18 (breaths/min): Photos: [1:No Photos] [N/A:N/A] Wound Location: [1:Right Toe Second] [N/A:N/A] Wounding Event: [1:Gradually Appeared] [N/A:N/A] Primary Etiology: [1:Arterial Insufficiency Ulcer N/A] Comorbid History: [1:Hypertension, Gout, Osteoarthritis] [N/A:N/A] Date Acquired: [1:12/26/2016] [N/A:N/A] Weeks of Treatment: [1:1] [N/A:N/A] Wound Status: [1:Open] [N/A:N/A] Pending Amputation on Yes [N/A:N/A] Presentation: Measurements L x W x D 0.3x0.5x0.1 [N/A:N/A] (cm) Area (cm) : [1:0.118] [  N/A:N/A] Volume (cm) : [1:0.012] [N/A:N/A] % Reduction in Area: [1:-25.50%] [N/A:N/A] % Reduction in Volume: -33.30% [N/A:N/A] Classification: [1:Full Thickness With Exposed Support Structures] [N/A:N/A] Exudate Amount: [1:Medium] [N/A:N/A] Exudate Type: [1:Purulent] [N/A:N/A] Exudate Color: [1:yellow, brown, green] [N/A:N/A] Wound Margin: [1:Flat and Intact] [N/A:N/A] Granulation Amount: [1:Large (67-100%)] [N/A:N/A] Granulation Quality: [1:Pink] [N/A:N/A] Necrotic Amount: [1:Small (1-33%)] [N/A:N/A] Exposed Structures: [1:Fat Layer (Subcutaneous N/A Tissue) Exposed: Yes Fascia: No] Tendon: No Muscle: No Joint: No Bone: No Epithelialization: None N/A N/A Periwound Skin Texture: Excoriation: No N/A N/A Induration: No Callus: No Crepitus: No Rash: No Scarring: No Periwound Skin Maceration: No N/A N/A Moisture: Dry/Scaly: No Periwound Skin Color: Atrophie Blanche: No N/A N/A Cyanosis: No Ecchymosis: No Erythema: No Hemosiderin Staining: No Mottled: No Pallor: No Rubor: No Temperature: No Abnormality N/A N/A Tenderness on Yes N/A N/A Palpation: Wound Preparation: Ulcer Cleansing: N/A N/A Rinsed/Irrigated with Saline Topical Anesthetic Applied: Other:  lidocaine 4% Treatment Notes Electronic Signature(s) Signed: 04/13/2017 4:34:39 PM By: Curtis Sites Entered By: Curtis Sites on 04/13/2017 11:26:57 Bajaj, [name] ([id number]) -------------------------------------------------------------------------------- Multi-Disciplinary Care Plan Details Patient Name: Drennon, Ajna L. Date of Service: 04/13/2017 11:00 AM Medical Record Number: [id number] Patient Account Number: [id number] Date of Birth/Sex: 1921-01-31 (83 y.o. Female) Treating RN: Curtis Sites Primary Care Lisseth Brazeau: Dewaine Oats Other Clinician: Referring Diandra Cimini: Dewaine Oats Treating Shaina Gullatt/Extender: Linwood Dibbles, HOYT Weeks in Treatment: 1 Active Inactive ` Abuse / Safety / Falls / Self Care Management Nursing Diagnoses: Impaired physical mobility Goals: Patient will remain injury free related to falls Date Initiated: 04/06/2017 Target Resolution Date: 06/22/2017 Goal Status: Active Interventions: Assess fall risk on admission and as needed Notes: ` Orientation to the Wound Care Program Nursing Diagnoses: Knowledge deficit related to the wound healing center program Goals: Patient/caregiver will verbalize understanding of the Wound Healing Center Program Date Initiated: 04/06/2017 Target Resolution Date: 06/22/2017 Goal Status: Active Interventions: Provide education on orientation to the wound center Notes: ` Wound/Skin Impairment Nursing Diagnoses: Impaired tissue integrity Teem, Marilynne L. ([id number]) Goals: Ulcer/skin breakdown will heal within 14 weeks Date Initiated: 04/06/2017 Target Resolution Date: 06/22/2017 Goal Status: Active Interventions: Assess patient/caregiver ability to obtain necessary supplies Assess patient/caregiver ability to perform ulcer/skin care regimen upon admission and as needed Assess ulceration(s) every visit Notes: Electronic Signature(s) Signed: 04/13/2017 4:34:39 PM By: Curtis Sites Entered By: Curtis Sites on  04/13/2017 11:26:40 Graven, Brissa L. ([id number]) -------------------------------------------------------------------------------- Pain Assessment Details Patient Name: Keefe, Decie L. Date of Service: 04/13/2017 11:00 AM Medical Record Number: [id number] Patient Account Number: [id number] Date of Birth/Sex: 04-30-1921 (83 y.o. Female) Treating RN: Curtis Sites Primary Care Darlyn Repsher: Dewaine Oats Other Clinician: Referring Ayyan Sites: Dewaine Oats Treating Donnesha Karg/Extender: Linwood Dibbles, HOYT Weeks in Treatment: 1 Active Problems Location of Pain Severity and Description of Pain Patient Has Paino No Site Locations Pain Management and Medication Current Pain Management: Notes Topical or injectable lidocaine is offered to patient for acute pain when surgical debridement is performed. If needed, Patient is instructed to use over the counter pain medication for the following 24-48 hours after debridement. Wound care MDs do not prescribed pain medications. Patient has chronic pain or uncontrolled pain. Patient has been instructed to make an appointment with their Primary Care Physician for pain management. Electronic Signature(s) Signed: 04/13/2017 4:34:39 PM By: Curtis Sites Entered By: Curtis Sites on 04/13/2017 11:07:30 Buckalew, [name] ([id number]) -------------------------------------------------------------------------------- Patient/Caregiver Education Details Patient Name: [name] Date of Service: 04/13/2017 11:00 AM Medical Record Number: [id number] Patient Account Number: [id number] Date of Birth/Gender: October 18, 1920 (83  y.o. Female) Treating RN: Curtis Sites Primary Care Physician: Dewaine Oats Other Clinician: Referring Physician: Dewaine Oats Treating Physician/Extender: Skeet Simmer in Treatment: 1 Education Assessment Education Provided To: Patient and Caregiver Education Topics Provided Wound/Skin Impairment: Handouts: Other: wound care as  ordered Methods: Explain/Verbal Responses: State content correctly Electronic Signature(s) Signed: 04/13/2017 4:34:39 PM By: Curtis Sites Entered By: Curtis Sites on 04/13/2017 11:29:17 Idleman, Laurisa L. ([id number]) -------------------------------------------------------------------------------- Wound Assessment Details Patient Name: Hanway, Vern L. Date of Service: 04/13/2017 11:00 AM Medical Record Number: [id number] Patient Account Number: [id number] Date of Birth/Sex: 04-19-1921 (83 y.o. Female) Treating RN: Curtis Sites Primary Care Robinson Brinkley: TATE, Katherina Right Other Clinician: Referring Jeniel Slauson: Dewaine Oats Treating Charmika Macdonnell/Extender: STONE III, HOYT Weeks in Treatment: 1 Wound Status Wound Number: 1 Primary Etiology: Arterial Insufficiency Ulcer Wound Location: Right Toe Second Wound Status: Open Wounding Event: Gradually Appeared Comorbid History: Hypertension, Gout, Osteoarthritis Date Acquired: 12/26/2016 Weeks Of Treatment: 1 Clustered Wound: No Pending Amputation On Presentation Photos Photo Uploaded By: Curtis Sites on 04/13/2017 16:35:58 Wound Measurements Length: (cm) 0.3 Width: (cm) 0.5 Depth: (cm) 0.1 Area: (cm) 0.118 Volume: (cm) 0.012 % Reduction in Area: -25.5% % Reduction in Volume: -33.3% Epithelialization: None Tunneling: No Undermining: No Wound Description Full Thickness With Exposed Classification: Support Structures Wound Margin: Flat and Intact Exudate Medium Amount: Exudate Type: Purulent Exudate Color: yellow, brown, green Foul Odor After Cleansing: No Slough/Fibrino Yes Wound Bed Granulation Amount: Large (67-100%) Exposed Structure Granulation Quality: Pink Fascia Exposed: No Montalban, Breyanna L. ([id number]) Necrotic Amount: Small (1-33%) Fat Layer (Subcutaneous Tissue) Exposed: Yes Necrotic Quality: Adherent Slough Tendon Exposed: No Muscle Exposed: No Joint Exposed: No Bone Exposed: Yes Periwound Skin Texture Texture  Color No Abnormalities Noted: No No Abnormalities Noted: No Callus: No Atrophie Blanche: No Crepitus: No Cyanosis: No Excoriation: No Ecchymosis: No Induration: No Erythema: No Rash: No Hemosiderin Staining: No Scarring: No Mottled: No Pallor: No Moisture Rubor: No No Abnormalities Noted: No Dry / Scaly: No Temperature / Pain Maceration: No Temperature: No Abnormality Tenderness on Palpation: Yes Wound Preparation Ulcer Cleansing: Rinsed/Irrigated with Saline Topical Anesthetic Applied: Other: lidocaine 4%, Treatment Notes Wound #1 (Right Toe Second) 1. Cleansed with: Clean wound with Normal Saline 2. Anesthetic Topical Lidocaine 4% cream to wound bed prior to debridement 4. Dressing Applied: Prisma Ag 5. Secondary Dressing Applied Gauze and Kerlix/Conform 7. Secured with Secretary/administrator) Signed: 04/13/2017 4:34:39 PM By: Curtis Sites Entered By: Curtis Sites on 04/13/2017 12:00:28 Stiverson, [name] ([id number]) -------------------------------------------------------------------------------- Vitals Details Patient Name: Findling, Evangelene L. Date of Service: 04/13/2017 11:00 AM Medical Record Number: [id number] Patient Account Number: [id number] Date of Birth/Sex: 1920-12-16 (83 y.o. Female) Treating RN: Curtis Sites Primary Care Michae Grimley: TATE, Katherina Right Other Clinician: Referring Rivaldo Hineman: TATE, Katherina Right Treating Eithan Beagle/Extender: STONE III, HOYT Weeks in Treatment: 1 Vital Signs Time Taken: 11:07 Temperature (F): 97.8 Pulse (bpm): 51 Respiratory Rate (breaths/min): 18 Blood Pressure (mmHg): 137/52 Reference Range: 80 - 120 mg / dl Electronic Signature(s) Signed: 04/13/2017 4:34:39 PM By: Curtis Sites Entered By: Curtis Sites on 04/13/2017 11:07:56

## 2017-04-27 ENCOUNTER — Encounter: Payer: Medicare Other | Attending: Surgery | Admitting: Surgery

## 2017-04-27 DIAGNOSIS — I739 Peripheral vascular disease, unspecified: Secondary | ICD-10-CM | POA: Insufficient documentation

## 2017-04-27 DIAGNOSIS — I10 Essential (primary) hypertension: Secondary | ICD-10-CM | POA: Insufficient documentation

## 2017-04-27 DIAGNOSIS — L97512 Non-pressure chronic ulcer of other part of right foot with fat layer exposed: Secondary | ICD-10-CM | POA: Diagnosis not present

## 2017-04-29 NOTE — Progress Notes (Signed)
[name], [name] ([id number]) Visit Report for 04/27/2017 Chief Complaint Document Details Patient Name: [name], [name] 04/27/2017 10:15 Date of Service: AM Medical Record [id number] Number: Patient Account Number: [id number] Nov 18, 1920 (83 y.o. Treating RN: [name] Date of Birth/Sex: Female) Other Clinician: Primary Care Provider: TATE, Katherina [name] Treating [name] Referring Provider: Dewaine [name] Provider/Extender: [name] in Treatment: 3 Information Obtained from: Patient Chief Complaint Patient seen for complaints of Non-Healing Wound to the [name] second toe Electronic Signature(s) Signed: 04/27/2017 11:16:34 AM By: [name] Kanner MD, FACS Entered By: [name] on 04/27/2017 11:16:34 [name] ([id number]) -------------------------------------------------------------------------------- HPI Details Patient Name: [name], [name] 04/27/2017 10:15 Date of Service: AM Medical Record [id number] Number: Patient Account Number: [id number] [birth year] (83 y.o. Treating RN: [name] Date of Birth/Sex: Female) Other Clinician: Primary Care Provider: TATE, [name] Treating [name] Referring Provider: Dewaine [name] Provider/Extender: Weeks in Treatment: 3 History of Present Illness Location: [name] second toe Quality: Patient reports No Pain. Severity: Patient states wound are getting worse. Duration: Patient has had the wound for > 3 months prior to seeking treatment at the wound center Context: The wound would happen gradually Modifying Factors: Other treatment(s) tried include:asked her to wash the foot, soak it in Epsom salt and apply some Bactroban and offload it. HPI Description: very pleasant 83 year old patient, no history of diabetes or smoking has had a ulceration on the [name] second toe dorsum for about 3 months. Besides essential hypertension and gout she does not have any other significant problems and has never been a smoker. 04/13/17 on evaluation  today patient appears to be doing about the same in regard to her [name] second toe ulcer. We did get the results for x-ray which showed osteopenia without any specific bone abnormality osteomyelitis. She does continue to have discomfort with palpation of the wound although secondary to mental status is unable to rate or describe this pain. No fevers, chills, nausea, or vomiting noted at this time. 04/27/2017 -- over the last couple of weeks the patient has had some issues with a urinary tract infection and some mental status changes and after review today, I have recommended we do not do the arterial studies recommended by my colleague last week, in view of the fact that the patient's wound has improved considerably and I do not want to put her through further investigations, which may not change the treatment plan. Electronic Signature(s) Signed: 04/27/2017 11:18:13 AM By: [name] Kanner MD, FACS Entered By: [name] on 04/27/2017 11:18:12 [name], [name] ([id number]) -------------------------------------------------------------------------------- Physical Exam Details Patient Name: [name] 04/27/2017 10:15 Date of Service: AM Medical Record [id number] Number: Patient Account Number: [id number] 20-Jan-1921 (83 y.o. Treating RN: [name] Date of Birth/Sex: Female) Other Clinician: Primary Care Provider: TATE, [name] Treating [name] Referring Provider: TATE, Katherina [name] Provider/Extender: Weeks in Treatment: 3 Constitutional . Pulse regular. Respirations normal and unlabored. Afebrile. . Eyes Nonicteric. Reactive to light. Ears, Nose, Mouth, and Throat Lips, teeth, and gums WNL.Marland Kitchen Moist mucosa without lesions. Neck supple and nontender. No palpable supraclavicular or cervical adenopathy. Normal sized without goiter. Respiratory WNL. No retractions.. Cardiovascular Pedal Pulses WNL. No clubbing, cyanosis or edema. Lymphatic No adneopathy. No adenopathy. No  adenopathy. Musculoskeletal Adexa without tenderness or enlargement.. Digits and nails w/o clubbing, cyanosis, infection, petechiae, ischemia, or inflammatory conditions.. Integumentary (Hair, Skin) No suspicious lesions. No crepitus or fluctuance. No peri-wound warmth or erythema. No masses.Marland Kitchen Psychiatric Judgement and insight Intact.. No evidence of depression, anxiety, or agitation.. Notes the wound looks  excellent and there is healthy granulation tissue and no evidence of any cellulitis. On flexion of the toe at the joint there is mobility but I do not palpate any bone. Electronic Signature(s) Signed: 04/27/2017 11:19:46 AM By: [name] Kanner MD, FACS Entered By: [name] on 04/27/2017 11:19:46 [name] ([id number]) -------------------------------------------------------------------------------- Physician Orders Details Patient Name: [name] 04/27/2017 10:15 Date of Service: AM Medical Record [id number] Number: Patient Account Number: [id number] 1921-06-14 (83 y.o. Treating RN: [name] Date of Birth/Sex: Female) Other Clinician: Primary Care Provider: TATE, [name] Treating [name] Referring Provider: Dewaine [name] Provider/Extender: Weeks in Treatment: 3 Verbal / Phone Orders: No Diagnosis Coding Wound Cleansing Wound #1 [name] Toe Second o Clean wound with Normal Saline. Anesthetic Wound #1 [name] Toe Second o Topical Lidocaine 4% cream applied to wound bed prior to debridement Primary Wound Dressing Wound #1 [name] Toe Second o Prisma Ag Secondary Dressing Wound #1 [name] Toe Second o Gauze and Kerlix/Conform - or bandaid or coverlet Dressing Change Frequency Wound #1 [name] Toe Second o Other: - change the bandage on shower days or if soiled Follow-up Appointments Wound #1 [name] Toe Second o Return Appointment in 1 week. Off-Loading Wound #1 [name] Toe Second o Other: - continue wearing shoes that do not rub the top of the  toe Additional Orders / Instructions Wound #1 [name] Toe Second o Increase protein intake. o Other: - Please add vitamin A, vitamin C and zinc supplements to your diet [name], [name] ([id number]) Electronic Signature(s) Signed: 04/27/2017 4:18:43 PM By: [name] Kanner MD, FACS Signed: 04/27/2017 4:21:56 PM By: [name] Entered By: [name] on 04/27/2017 10:45:47 [name], [name] ([id number]) -------------------------------------------------------------------------------- Problem List Details Patient Name: YAZLEEN, MOLOCK 04/27/2017 10:15 Date of Service: AM Medical Record [id number] Number: Patient Account Number: [id number] 05/06/1921 (83 y.o. 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 Treating RN: [name] Date of Birth/Sex: Female) Other Clinician: Primary Care Provider: TATE, [name] Treating Sabrina Arriaga Referring Provider: Dewaine [name] Provider/Extender: [name] in Treatment: 3 Subjective Chief Complaint Information obtained from Patient Patient seen for  complaints of Non-Healing Wound to the [name] second toe History of Present Illness (HPI) The following HPI elements were documented for the patient's wound: Location: [name] second toe Quality: Patient reports No Pain. Severity: Patient states wound are getting worse. Duration: Patient has had the wound for > 3 months prior to seeking treatment at the wound center Context: The wound would happen gradually Modifying Factors: Other treatment(s) tried include:asked her to wash the foot, soak it in Epsom salt and apply some Bactroban and offload it. very pleasant 83 year old patient, no history of diabetes or smoking has had a ulceration on the [name] second toe dorsum for about 3 months. Besides essential hypertension and gout she does not have any other significant problems and has never been a smoker. 04/13/17 on  evaluation today patient appears to be doing about the same in regard to her [name] second toe ulcer. We did get the results for x-ray which showed osteopenia without any specific bone abnormality osteomyelitis. She does continue to have discomfort with palpation of the wound although secondary to mental status is unable to rate or describe this pain. No fevers, chills, nausea, or vomiting noted at this time. 04/27/2017 -- over the last couple of weeks the patient has had some issues with a urinary tract infection and some mental status changes and after review today, I have recommended we do not do the arterial studies recommended by my colleague last week, in view of the fact that the patient's wound has improved considerably and I do not want to put her through further investigations, which may not change the treatment plan. [name], [name] ([id number]) Objective Constitutional Pulse regular. Respirations normal and unlabored. Afebrile. Vitals Time Taken: 10:36 AM, Temperature: 98.3 F, Pulse: 84 bpm, Respiratory Rate: 16 breaths/min, Blood Pressure: 126/102  mmHg. Eyes Nonicteric. Reactive to light. Ears, Nose, Mouth, and Throat Lips, teeth, and gums WNL.Marland Kitchen Moist mucosa without lesions. Neck supple and nontender. No palpable supraclavicular or cervical adenopathy. Normal sized without goiter. Respiratory WNL. No retractions.. Cardiovascular Pedal Pulses WNL. No clubbing, cyanosis or edema. Lymphatic No adneopathy. No adenopathy. No adenopathy. Musculoskeletal Adexa without tenderness or enlargement.. Digits and nails w/o clubbing, cyanosis, infection, petechiae, ischemia, or inflammatory conditions.Marland Kitchen Psychiatric Judgement and insight Intact.. No evidence of depression, anxiety, or agitation.. General Notes: the wound looks excellent and there is healthy granulation tissue and no evidence of any cellulitis. On flexion of the toe at the joint there is mobility but I do not palpate any bone. Integumentary (Hair, Skin) No suspicious lesions. No crepitus or fluctuance. No peri-wound warmth or erythema. No masses.. Wound #1 status is Open. Original cause of wound was Gradually Appeared. The wound is located on the [name] Toe Second. The wound measures 0.3cm length x 0.4cm width x 0.1cm depth; 0.094cm^2 area and 0.009cm^3 volume. There is bone and Fat Layer (Subcutaneous Tissue) Exposed exposed. There is no tunneling or undermining noted. There is a medium amount of purulent drainage noted. The wound margin is flat and intact. There is large (67-100%) pink granulation within the wound bed. There is a small (1-33%) amount of necrotic tissue within the wound bed including Adherent Slough. The periwound skin appearance [name], [name] ([id number]) did not exhibit: Callus, Crepitus, Excoriation, Induration, Rash, Scarring, Dry/Scaly, Maceration, Atrophie Blanche, Cyanosis, Ecchymosis, Hemosiderin Staining, Mottled, Pallor, Rubor, Erythema. Periwound temperature was noted as No Abnormality. The periwound has tenderness on palpation. 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 Anesthetic: Wound #1 [name] Toe Second: Topical Lidocaine 4% cream applied to wound bed prior to debridement Primary Wound Dressing: Wound #1 [name] Toe Second: Prisma Ag Secondary Dressing: Wound #1 [name] Toe Second: Gauze and Kerlix/Conform - or bandaid or coverlet Dressing Change Frequency: Wound #1 [name] Toe Second: Other: - change the bandage on shower days or if soiled Follow-up Appointments: Wound #1 [name] Toe Second: Return Appointment in 1 week. Off-Loading: Wound #1 [name] Toe Second: Other: - continue wearing shoes that do not rub the top of the toe Additional Orders / Instructions: Wound #1 [name] Toe Second: Increase protein intake. Other: -  Please add vitamin A, vitamin C and zinc supplements to your diet [name], [name] ([id number]) After review today I have recommended: 1. Prisma AG with a bordered foam to be applied every other day 2. Offloading is been discussed in great detail 3. X-ray of the [name] foot did not show any bony involvement 4. Adequate protein, vitamin A, vitamin C and zinc 5. We have discussed with her caregiver that at this stage I'm not asking for vascular studies. Electronic Signature(s) Signed: 04/27/2017 11:20:49 AM By: [name] Kanner MD, FACS Entered By: [name] on 04/27/2017 11:20:49 [name], [name] ([id number]) -------------------------------------------------------------------------------- SuperBill Details Patient Name: [name], [name] Date of Service: 04/27/2017 Medical Record Number: [id number] Patient Account Number: [id number] Date of Birth/Sex: Jul 27, 1920 (83 y.o. 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 I73.9 Peripheral vascular disease, unspecified I10 Essential (primary) hypertension Facility Procedures CPT4 Code: [id number] Description: 99213 - WOUND CARE VISIT-LEV 3 EST PT Modifier: Quantity: 1 Physician Procedures CPT4 Code Description: [id number] - WC PHYS LEVEL 3 - EST PT ICD-10 Description Diagnosis L97.512 Non-pressure chronic ulcer of other part of [name] fo I73.9 Peripheral vascular disease, unspecified I10 Essential (primary) hypertension Modifier: ot with fat lay Quantity: 1 er exposed Electronic Signature(s) Signed: 04/27/2017 4:18:43 PM By: [name] Kanner MD, FACS Signed: 04/27/2017 4:21:56 PM By: [name] Previous Signature: 04/27/2017 11:21:01 AM Version By: [name] Kanner MD, FACS Entered By: [name] on 04/27/2017 12:55:40

## 2017-04-29 NOTE — Progress Notes (Signed)
[name], [name] ([id number]) Visit Report for 04/27/2017 Arrival Information Details Patient Name: [name], [name] Date of Service: 04/27/2017 10:15 AM Medical Record Number: [id number] Patient Account Number: [id number] Date of Birth/Sex: [birth year] (83 y.o. Female) Treating RN: Curtis Sites Primary Care Ashantia Amaral: Dewaine Oats Other Clinician: Referring Genoa Freyre: Dewaine Oats Treating Aser Nylund/Extender: Rudene Re in Treatment: 3 Visit Information History Since Last Visit Added or deleted any medications: No Patient Arrived: Ambulatory Any new allergies or adverse reactions: No Arrival Time: 10:28 Had a fall or experienced change in No Accompanied By: staff activities of daily living that may affect Transfer Assistance: None risk of falls: Patient Identification Verified: Yes Signs or symptoms of abuse/neglect since last No Secondary Verification Process Yes visito Completed: Hospitalized since last visit: No Patient Has Alerts: Yes Has Dressing in Place as Prescribed: Yes Patient Alerts: unable to obtain Pain Present Now: No ABI R/T inaudible pulses Electronic Signature(s) Signed: 04/27/2017 4:21:56 PM By: Curtis Sites Entered By: Curtis Sites on 04/27/2017 10:29:00 Wessler, [name] ([id number]) -------------------------------------------------------------------------------- Clinic Level of Care Assessment Details Patient Name: [name] Date of Service: 04/27/2017 10:15 AM Medical Record Number: [id number] Patient Account Number: [id number] Date of Birth/Sex: 23-Apr-1921 (83 y.o. Female) Treating RN: Curtis Sites Primary Care Jeric Slagel: TATE, Katherina Right Other Clinician: Referring Khaiden Segreto: Dewaine Oats Treating Kailer Heindel/Extender: Rudene Re in Treatment: 3 Clinic Level of Care Assessment Items TOOL 4 Quantity Score  - Use when only an EandM is performed on FOLLOW-UP visit 0 ASSESSMENTS - Nursing Assessment / Reassessment X - Reassessment of  Co-morbidities (includes updates in patient status) 1 10 X - Reassessment of Adherence to Treatment Plan 1 5 ASSESSMENTS - Wound and Skin Assessment / Reassessment X - Simple Wound Assessment / Reassessment - one wound 1 5  - Complex Wound Assessment / Reassessment - multiple wounds 0  - Dermatologic / Skin Assessment (not related to wound area) 0 ASSESSMENTS - Focused Assessment  - Circumferential Edema Measurements - multi extremities 0  - Nutritional Assessment / Counseling / Intervention 0 X - Lower Extremity Assessment (monofilament, tuning fork, pulses) 1 5  - Peripheral Arterial Disease Assessment (using hand held doppler) 0 ASSESSMENTS - Ostomy and/or Continence Assessment and Care  - Incontinence Assessment and Management 0  - Ostomy Care Assessment and Management (repouching, etc.) 0 PROCESS - Coordination of Care X - Simple Patient / Family Education for ongoing care 1 15  - Complex (extensive) Patient / Family Education for ongoing care 0  - Staff obtains Chiropractor, Records, Test Results / Process Orders 0  - Staff telephones HHA, Nursing Homes / Clarify orders / etc 0  - Routine Transfer to another Facility (non-emergent condition) 0 [name], [name] ([id number])  - Routine Hospital Admission (non-emergent condition) 0  - New Admissions / Manufacturing engineer / Ordering NPWT, Apligraf, etc. 0  - Emergency Hospital Admission (emergent condition) 0 X - Simple Discharge Coordination 1 10  - Complex (extensive) Discharge Coordination 0 PROCESS - Special Needs  - Pediatric / Minor Patient Management 0  - Isolation Patient Management 0  - Hearing / Language / Visual special needs 0  - Assessment of Community assistance (transportation, D/C planning, etc.) 0  - Additional assistance / Altered mentation 0  - Support Surface(s) Assessment (bed, cushion, seat, etc.) 0 INTERVENTIONS - Wound Cleansing / Measurement X - Simple Wound  Cleansing - one wound 1 5  - Complex Wound Cleansing - multiple wounds 0 X - Wound Imaging (photographs - any number of wounds)  1 5  - Wound Tracing (instead of photographs) 0 X - Simple Wound Measurement - one wound 1 5  - Complex Wound Measurement - multiple wounds 0 INTERVENTIONS - Wound Dressings X - Small Wound Dressing one or multiple wounds 1 10  - Medium Wound Dressing one or multiple wounds 0  - Large Wound Dressing one or multiple wounds 0  - Application of Medications - topical 0  - Application of Medications - injection 0 INTERVENTIONS - Miscellaneous  - External ear exam 0 [name], [name] ([id number])  - Specimen Collection (cultures, biopsies, blood, body fluids, etc.) 0  - Specimen(s) / Culture(s) sent or taken to Lab for analysis 0  - Patient Transfer (multiple staff / Michiel Sites Lift / Similar devices) 0  - Simple Staple / Suture removal (25 or less) 0  - Complex Staple / Suture removal (26 or more) 0  - Hypo / Hyperglycemic Management (close monitor of Blood Glucose) 0  - Ankle / Brachial Index (ABI) - do not check if billed separately 0 X - Vital Signs 1 5 Has the patient been seen at the hospital within the last three years: Yes Total Score: 80 Level Of Care: New/Established - Level 3 Electronic Signature(s) Signed: 04/27/2017 4:21:56 PM By: Curtis Sites Entered By: Curtis Sites on 04/27/2017 12:55:29 [name], [name] ([id number]) -------------------------------------------------------------------------------- Encounter Discharge Information Details Patient Name: [name] Bayley [name]. Date of Service: 04/27/2017 10:15 AM Medical Record Number: [id number] Patient Account Number: [id number] Date of Birth/Sex: 10/10/1920 (83 y.o. Female) Treating RN: Curtis Sites Primary Care Irem Stoneham: Dewaine Oats Other Clinician: Referring Braian Tijerina: Dewaine Oats Treating Angelly Spearing/Extender: Rudene Re in Treatment: 3 Encounter Discharge  Information Items Discharge Pain Level: 0 Discharge Condition: Stable Ambulatory Status: Ambulatory Discharge Destination: Home Transportation: Private Auto Accompanied By: staff Schedule Follow-up Appointment: Yes Medication Reconciliation completed and provided to Patient/Care No Kohana Amble: Provided on Clinical Summary of Care: 04/27/2017 Form Type Recipient Paper Patient [facility] Electronic Signature(s) Signed: 04/27/2017 4:20:56 PM By: Gwenlyn Perking Entered By: Gwenlyn Perking on 04/27/2017 10:59:05 Fatima, Hasna [name]. ([id number]) -------------------------------------------------------------------------------- Lower Extremity Assessment Details Patient Name: [name], [name] Date of Service: 04/27/2017 10:15 AM Medical Record Number: [id number] Patient Account Number: [id number] Date of Birth/Sex: 1921-04-10 (83 y.o. Female) Treating RN: Curtis Sites Primary Care Lucrecia Mcphearson: Dewaine Oats Other Clinician: Referring Rayhaan Huster: Dewaine Oats Treating Maebel Marasco/Extender: Rudene Re in Treatment: 3 Vascular Assessment Pulses: Dorsalis Pedis Palpable: [Right:No] Doppler Audible: [Right:Inaudible] Posterior Tibial Extremity colors, hair growth, and conditions: Extremity Color: [Right:Hyperpigmented] Hair Growth on Extremity: [Right:No] Temperature of Extremity: [Right:Cool] Capillary Refill: [Right:> 3 seconds] Electronic Signature(s) Signed: 04/27/2017 4:21:56 PM By: Curtis Sites Entered By: Curtis Sites on 04/27/2017 10:39:11 Mikkelsen, Verdella [name]. ([id number]) -------------------------------------------------------------------------------- Multi Wound Chart Details Patient Name: Livers, Falynn [name]. Date of Service: 04/27/2017 10:15 AM Medical Record Number: [id number] Patient Account Number: [id number] Date of Birth/Sex: 17-Nov-1920 (83 y.o. Female) Treating RN: Curtis Sites Primary Care Jhace Fennell: Dewaine Oats Other Clinician: Referring Walsie Smeltz: Dewaine Oats Treating  Ignatz Deis/Extender: Rudene Re in Treatment: 3 Vital Signs Height(in): Pulse(bpm): 84 Weight(lbs): Blood Pressure 126/102 (mmHg): Body Mass Index(BMI): Temperature(F): 98.3 Respiratory Rate 16 (breaths/min): Photos: [1:No Photos] [N/A:N/A] Wound Location: [1:Right Toe Second] [N/A:N/A] Wounding Event: [1:Gradually Appeared] [N/A:N/A] Primary Etiology: [1:Arterial Insufficiency Ulcer N/A] Comorbid History: [1:Hypertension, Gout, Osteoarthritis] [N/A:N/A] Date Acquired: [1:12/26/2016] [N/A:N/A] Weeks of Treatment: [1:3] [N/A:N/A] Wound Status: [1:Open] [N/A:N/A] Pending Amputation on Yes [N/A:N/A] Presentation: Measurements [name] x W x D 0.3x0.4x0.1 [N/A:N/A] (cm) Area (cm) : [1:0.094] [N/A:N/A] Volume (cm) : [1:0.009] [  N/A:N/A] % Reduction in Area: [1:0.00%] [N/A:N/A] % Reduction in Volume: 0.00% [N/A:N/A] Classification: [1:Full Thickness With Exposed Support Structures] [N/A:N/A] Exudate Amount: [1:Medium] [N/A:N/A] Exudate Type: [1:Purulent] [N/A:N/A] Exudate Color: [1:yellow, brown, green] [N/A:N/A] Wound Margin: [1:Flat and Intact] [N/A:N/A] Granulation Amount: [1:Large (67-100%)] [N/A:N/A] Granulation Quality: [1:Pink] [N/A:N/A] Necrotic Amount: [1:Small (1-33%)] [N/A:N/A] Exposed Structures: [1:Fat Layer (Subcutaneous N/A Tissue) Exposed: Yes Bone: Yes] Fascia: No Tendon: No Muscle: No Joint: No Epithelialization: None N/A N/A Periwound Skin Texture: Excoriation: No N/A N/A Induration: No Callus: No Crepitus: No Rash: No Scarring: No Periwound Skin Maceration: No N/A N/A Moisture: Dry/Scaly: No Periwound Skin Color: Atrophie Blanche: No N/A N/A Cyanosis: No Ecchymosis: No Erythema: No Hemosiderin Staining: No Mottled: No Pallor: No Rubor: No Temperature: No Abnormality N/A N/A Tenderness on Yes N/A N/A Palpation: Wound Preparation: Ulcer Cleansing: N/A N/A Rinsed/Irrigated with Saline Topical Anesthetic Applied: Other:  lidocaine 4% Treatment Notes Electronic Signature(s) Signed: 04/27/2017 11:16:25 AM By: Evlyn Kanner MD, FACS Entered By: Evlyn Kanner on 04/27/2017 11:16:25 Alejos, [name] ([id number]) -------------------------------------------------------------------------------- Multi-Disciplinary Care Plan Details Patient Name: [name], [name] Date of Service: 04/27/2017 10:15 AM Medical Record Number: [id number] Patient Account Number: [id number] Date of Birth/Sex: 07-16-1921 (83 y.o. Female) Treating RN: Curtis Sites Primary Care Miron Marxen: Dewaine Oats Other Clinician: Referring Nana Hoselton: Dewaine Oats Treating Anessa Charley/Extender: Rudene Re in Treatment: 3 Active Inactive ` Abuse / Safety / Falls / Self Care Management Nursing Diagnoses: Impaired physical mobility Goals: Patient will remain injury free related to falls Date Initiated: 04/06/2017 Target Resolution Date: 06/22/2017 Goal Status: Active Interventions: Assess fall risk on admission and as needed Notes: ` Orientation to the Wound Care Program Nursing Diagnoses: Knowledge deficit related to the wound healing center program Goals: Patient/caregiver will verbalize understanding of the Wound Healing Center Program Date Initiated: 04/06/2017 Target Resolution Date: 06/22/2017 Goal Status: Active Interventions: Provide education on orientation to the wound center Notes: ` Wound/Skin Impairment Nursing Diagnoses: Impaired tissue integrity [name], [name] ([id number]) Goals: Ulcer/skin breakdown will heal within 14 weeks Date Initiated: 04/06/2017 Target Resolution Date: 06/22/2017 Goal Status: Active Interventions: Assess patient/caregiver ability to obtain necessary supplies Assess patient/caregiver ability to perform ulcer/skin care regimen upon admission and as needed Assess ulceration(s) every visit Notes: Electronic Signature(s) Signed: 04/27/2017 4:21:56 PM By: Curtis Sites Entered By: Curtis Sites on  04/27/2017 10:43:38 [name], [name] ([id number]) -------------------------------------------------------------------------------- Pain Assessment Details Patient Name: [name], [name] Date of Service: 04/27/2017 10:15 AM Medical Record Number: [id number] Patient Account Number: [id number] Date of Birth/Sex: August 10, 1920 (83 y.o. Female) Treating RN: Curtis Sites Primary Care Angus Amini: Dewaine Oats Other Clinician: Referring Maryum Batterson: Dewaine Oats Treating Renette Hsu/Extender: Rudene Re in Treatment: 3 Active Problems Location of Pain Severity and Description of Pain Patient Has Paino No Site Locations Pain Management and Medication Current Pain Management: Notes Topical or injectable lidocaine is offered to patient for acute pain when surgical debridement is performed. If needed, Patient is instructed to use over the counter pain medication for the following 24-48 hours after debridement. Wound care MDs do not prescribed pain medications. Patient has chronic pain or uncontrolled pain. Patient has been instructed to make an appointment with their Primary Care Physician for pain management. Electronic Signature(s) Signed: 04/27/2017 4:21:56 PM By: Curtis Sites Entered By: Curtis Sites on 04/27/2017 10:29:09 Loberg, [name] ([id number]) -------------------------------------------------------------------------------- Patient/Caregiver Education Details Patient Name: [name] Date of Service: 04/27/2017 10:15 AM Medical Record Number: [id number] Patient Account Number: [id number] Date of Birth/Gender: May 09, 1921 (83 y.o. Female) Treating RN: Dorthy,  Mardene Celeste Primary Care Physician: Dewaine Oats Other Clinician: Referring Physician: Dewaine Oats Treating Physician/Extender: Rudene Re in Treatment: 3 Education Assessment Education Provided To: Caregiver Education Topics Provided Wound/Skin Impairment: Handouts: Other: wound care as ordered Methods:  Demonstration, Explain/Verbal Responses: State content correctly Electronic Signature(s) Signed: 04/27/2017 4:21:56 PM By: Curtis Sites Entered By: Curtis Sites on 04/27/2017 10:44:37 Levan, Rayvn [name]. ([id number]) -------------------------------------------------------------------------------- Wound Assessment Details Patient Name: Caruthers, Kensington [name]. Date of Service: 04/27/2017 10:15 AM Medical Record Number: [id number] Patient Account Number: [id number] Date of Birth/Sex: [birth year] (83 y.o. Female) Treating RN: Curtis Sites Primary Care Anastasya Jewell: Dewaine Oats Other Clinician: Referring Lyan Moyano: Dewaine Oats Treating Tericka Devincenzi/Extender: Rudene Re in Treatment: 3 Wound Status Wound Number: 1 Primary Etiology: Arterial Insufficiency Ulcer Wound Location: Right Toe Second Wound Status: Open Wounding Event: Gradually Appeared Comorbid History: Hypertension, Gout, Osteoarthritis Date Acquired: 12/26/2016 Weeks Of Treatment: 3 Clustered Wound: No Pending Amputation On Presentation Photos Photo Uploaded By: Curtis Sites on 04/27/2017 14:45:39 Wound Measurements Length: (cm) 0.3 Width: (cm) 0.4 Depth: (cm) 0.1 Area: (cm) 0.094 Volume: (cm) 0.009 % Reduction in Area: 0% % Reduction in Volume: 0% Epithelialization: None Tunneling: No Undermining: No Wound Description Full Thickness With Exposed Classification: Support Structures Wound Margin: Flat and Intact Exudate Medium Amount: Exudate Type: Purulent Exudate Color: yellow, brown, green Foul Odor After Cleansing: No Slough/Fibrino Yes Wound Bed Granulation Amount: Large (67-100%) Exposed Structure Granulation Quality: Pink Fascia Exposed: No Coldwell, Shamyra [name]. ([id number]) Necrotic Amount: Small (1-33%) Fat Layer (Subcutaneous Tissue) Exposed: Yes Necrotic Quality: Adherent Slough Tendon Exposed: No Muscle Exposed: No Joint Exposed: No Bone Exposed: Yes Periwound Skin Texture Texture Color No  Abnormalities Noted: No No Abnormalities Noted: No Callus: No Atrophie Blanche: No Crepitus: No Cyanosis: No Excoriation: No Ecchymosis: No Induration: No Erythema: No Rash: No Hemosiderin Staining: No Scarring: No Mottled: No Pallor: No Moisture Rubor: No No Abnormalities Noted: No Dry / Scaly: No Temperature / Pain Maceration: No Temperature: No Abnormality Tenderness on Palpation: Yes Wound Preparation Ulcer Cleansing: Rinsed/Irrigated with Saline Topical Anesthetic Applied: Other: lidocaine 4%, Treatment Notes Wound #1 (Right Toe Second) 1. Cleansed with: Clean wound with Normal Saline 2. Anesthetic Topical Lidocaine 4% cream to wound bed prior to debridement 4. Dressing Applied: Prisma Ag Other dressing (specify in notes) Notes coverlet Electronic Signature(s) Signed: 04/27/2017 4:21:56 PM By: Curtis Sites Entered By: Curtis Sites on 04/27/2017 10:38:46 Mak, Anslee [name]. ([id number]) -------------------------------------------------------------------------------- Vitals Details Patient Name: Muchmore, Beverlyn [name]. Date of Service: 04/27/2017 10:15 AM Medical Record Number: [id number] Patient Account Number: [id number] Date of Birth/Sex: 1921-02-06 (83 y.o. Female) Treating RN: Curtis Sites Primary Care Marrisa Kimber: TATE, Katherina Right Other Clinician: Referring Namon Villarin: Dewaine Oats Treating Isobelle Tuckett/Extender: Rudene Re in Treatment: 3 Vital Signs Time Taken: 10:36 Temperature (F): 98.3 Pulse (bpm): 84 Respiratory Rate (breaths/min): 16 Blood Pressure (mmHg): 126/102 Reference Range: 80 - 120 mg / dl Electronic Signature(s) Signed: 04/27/2017 4:21:56 PM By: Curtis Sites Entered By: Curtis Sites on 04/27/2017 10:36:10

## 2017-04-30 ENCOUNTER — Other Ambulatory Visit: Payer: Self-pay | Admitting: Internal Medicine

## 2017-04-30 DIAGNOSIS — R4182 Altered mental status, unspecified: Secondary | ICD-10-CM

## 2017-05-04 ENCOUNTER — Encounter: Payer: Medicare Other | Admitting: Surgery

## 2017-05-04 ENCOUNTER — Ambulatory Visit
Admission: RE | Admit: 2017-05-04 | Discharge: 2017-05-04 | Disposition: A | Discharge status: Home / Self Care | Payer: Medicare Other | Source: Ambulatory Visit | Attending: Internal Medicine | Admitting: Internal Medicine

## 2017-05-04 DIAGNOSIS — L97512 Non-pressure chronic ulcer of other part of right foot with fat layer exposed: Secondary | ICD-10-CM | POA: Diagnosis not present

## 2017-05-04 DIAGNOSIS — G319 Degenerative disease of nervous system, unspecified: Secondary | ICD-10-CM | POA: Diagnosis not present

## 2017-05-04 DIAGNOSIS — R4182 Altered mental status, unspecified: Secondary | ICD-10-CM

## 2017-05-07 NOTE — Progress Notes (Signed)
[name] [name] ([id number]) Visit Report for 05/04/2017 Chief Complaint Document Details Patient Name: [name] [name] ALLERTON. Date of Service: 05/04/2017 9:15 AM Medical Record Number: [id number] Patient Account Number: [id number] Date of Birth/Sex: 04-16-1921 (83 y.o. Female) Treating RN: Curtis Sites Primary Care Provider: Dewaine Oats Other Clinician: Referring Provider: Dewaine Oats Treating Provider/Extender: Rudene Re in Treatment: 4 Information Obtained from: Patient Chief Complaint Patient seen for complaints of Non-Healing Wound to the right second toe Electronic Signature(s) Signed: 05/04/2017 10:20:49 AM By: Evlyn Kanner MD, FACS Entered By: Evlyn Kanner on 05/04/2017 10:20:49 [name] [name] L. ([id number]) -------------------------------------------------------------------------------- HPI Details Patient Name: [name] [name] L. Date of Service: 05/04/2017 9:15 AM Medical Record Number: [id number] Patient Account Number: [id number] Date of Birth/Sex: [birth year] (83 y.o. Female) Treating RN: Curtis Sites Primary Care Provider: Dewaine Oats Other Clinician: Referring Provider: Dewaine Oats Treating Provider/Extender: Rudene Re in Treatment: 4 History of Present Illness Location: right second toe Quality: Patient reports No Pain. Severity: Patient states wound are getting worse. Duration: Patient has had the wound for > 3 months prior to seeking treatment at the wound center Context: The wound would happen gradually Modifying Factors: Other treatment(s) tried include:asked her to wash the foot, soak it in Epsom salt and apply some Bactroban and offload it. HPI Description: very pleasant 83 year old patient, no history of diabetes or smoking has had a ulceration on the right second toe dorsum for about 3 months. Besides essential hypertension and gout she does not have any other significant problems and has never been a smoker. 04/13/17 on evaluation today  patient appears to be doing about the same in regard to her right second toe ulcer. We did get the results for x-ray which showed osteopenia without any specific bone abnormality osteomyelitis. She does continue to have discomfort with palpation of the wound although secondary to mental status is unable to rate or describe this pain. No fevers, chills, nausea, or vomiting noted at this time. 04/27/2017 -- over the last couple of weeks the patient has had some issues with a urinary tract infection and some mental status changes and after review today, I have recommended we do not do the arterial studies recommended by my colleague last week, in view of the fact that the patient's wound has improved considerably and I do not want to put her through further investigations, which may not change the treatment plan. 05/04/2017 -- the patient was seen by her podiatrist and also has a CT scan pending to workup her recent mental status changes. Electronic Signature(s) Signed: 05/04/2017 10:21:13 AM By: Evlyn Kanner MD, FACS Entered By: Evlyn Kanner on 05/04/2017 10:21:13 [name] [name] ([id number]) -------------------------------------------------------------------------------- Physical Exam Details Patient Name: [name] [name] L. Date of Service: 05/04/2017 9:15 AM Medical Record Number: [id number] Patient Account Number: [id number] Date of Birth/Sex: [birth year] (83 y.o. Female) Treating RN: Curtis Sites Primary Care Provider: Dewaine Oats Other Clinician: Referring Provider: Dewaine Oats Treating Provider/Extender: Rudene Re in Treatment: 4 Constitutional . Pulse regular. Respirations normal and unlabored. Afebrile. . Eyes Nonicteric. Reactive to light. Ears, Nose, Mouth, and Throat Lips, teeth, and gums WNL.Marland Kitchen Moist mucosa without lesions. Neck supple and nontender. No palpable supraclavicular or cervical adenopathy. Normal sized without goiter. Respiratory WNL. No  retractions.. Cardiovascular Pedal Pulses WNL. No clubbing, cyanosis or edema. Lymphatic No adneopathy. No adenopathy. No adenopathy. Musculoskeletal Adexa without tenderness or enlargement.. Digits and nails w/o clubbing, cyanosis, infection, petechiae, ischemia, or inflammatory conditions.. Integumentary (Hair, Skin) No suspicious lesions. No crepitus or fluctuance.  No peri-wound warmth or erythema. No masses.Marland Kitchen. Psychiatric Judgement and insight Intact.. No evidence of depression, anxiety, or agitation.. Notes the wound looks very clean and there is no evidence of any surrounding cellulitis or purulent drainage. There is no probing down to bone today. Electronic Signature(s) Signed: 05/04/2017 10:21:50 AM By: Evlyn KannerBritto, Kemaya Dorner MD, FACS Entered By: Evlyn KannerBritto, Makinzie Considine on 05/04/2017 10:21:49 [name], [name] ChurnKATIE L. ([id number]) -------------------------------------------------------------------------------- Physician Orders Details Patient Name: [name], [name] Date of Service: 05/04/2017 9:15 AM Medical Record Number: [id number] Patient Account Number: [id number] Date of Birth/Sex: 1920/09/22 (83 y.o. Female) Treating RN: Curtis Sitesorthy, Joanna Primary Care Provider: Dewaine OatsATE, DENNY Other Clinician: Referring Provider: Dewaine OatsATE, DENNY Treating Provider/Extender: Rudene ReBritto, Naeem Quillin Weeks in Treatment: 4 Verbal / Phone Orders: No Diagnosis Coding Wound Cleansing Wound #1 Right Toe Second o Clean wound with Normal Saline. Anesthetic Wound #1 Right Toe Second o Topical Lidocaine 4% cream applied to wound bed prior to debridement Primary Wound Dressing Wound #1 Right Toe Second o Prisma Ag Secondary Dressing Wound #1 Right Toe Second o Gauze and Kerlix/Conform - or bandaid or coverlet Dressing Change Frequency Wound #1 Right Toe Second o Other: - change the bandage on shower days or if soiled Follow-up Appointments Wound #1 Right Toe Second o Return Appointment in 1 week. Off-Loading Wound #1 Right  Toe Second o Other: - continue wearing shoes that do not rub the top of the toe Additional Orders / Instructions Wound #1 Right Toe Second o Increase protein intake. o Other: - Please add vitamin A, vitamin C and zinc supplements to your diet Electronic Signature(s) Signed: 05/04/2017 12:29:29 PM By: Evlyn KannerBritto, Jaceion Aday MD, FACS Signed: 05/04/2017 4:04:29 PM By: Curtis Sitesorthy, Joanna Entered By: Curtis Sitesorthy, Joanna on 05/04/2017 09:43:55 [name], Nasiyah L. ([id number]) -------------------------------------------------------------------------------- Problem List Details Patient Name: Meyering, Aina L. Date of Service: 05/04/2017 9:15 AM Medical Record Number: [id number] Patient Account Number: [id number] Date of Birth/Sex: 1920/09/22 (83 y.o. 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 ([id number]) -------------------------------------------------------------------------------- Progress Note Details Patient Name: [name] [name] L. Date of Service: 05/04/2017 9:15 AM Medical Record Number: [id number] Patient Account Number: [id number] Date of Birth/Sex: 1920/09/22 (83 y.o. Female) Treating RN: Curtis Sitesorthy, Joanna Primary Care Provider: Dewaine OatsATE, DENNY Other Clinician: Referring Provider: Dewaine OatsATE, DENNY Treating Provider/Extender: Rudene ReBritto, Rayane Gallardo Weeks in Treatment: 4 Subjective Chief Complaint Information  obtained from Patient Patient seen for complaints of Non-Healing Wound to the right second toe History of Present Illness (HPI) The following HPI elements were documented for the patient's wound: Location: right second toe Quality: Patient reports No Pain. Severity: Patient states wound are getting worse. Duration: Patient has had the wound for > 3 months prior to seeking treatment at the wound center Context: The wound would happen gradually Modifying Factors: Other treatment(s) tried include:asked her to wash the foot, soak it in Epsom salt and apply some Bactroban and offload it. very pleasant 83 year old patient, no history of diabetes or smoking has had a ulceration on the right second toe dorsum for about 3 months. Besides essential hypertension and gout she does not have any other  significant problems and has never been a smoker. 04/13/17 on evaluation today patient appears to be doing about the same in regard to her right second toe ulcer. We did get the results for x-ray which showed osteopenia without any specific bone abnormality osteomyelitis. She does continue to have discomfort with palpation of the wound although secondary to mental status is unable to rate or describe this pain. No fevers, chills, nausea, or vomiting noted at this time. 04/27/2017 -- over the last couple of weeks the patient has had some issues with a urinary tract infection and some mental status changes and after review today, I have recommended we do not do the arterial studies recommended by my colleague last week, in view of the fact that the patient's wound has improved considerably and I do not want to put her through further investigations, which may not change the treatment plan. 05/04/2017 -- the patient was seen by her podiatrist and also has a CT scan pending to workup her recent mental status changes. Objective Constitutional Pulse regular. Respirations normal and unlabored. Afebrile. Vitals  Time Taken: 9:32 AM, Temperature: 97.9 F, Pulse: 54 bpm, Respiratory Rate: 16 breaths/min, Blood Pressure: 159/67 mmHg. [name] [name] [name] L. ([id number]) Eyes Nonicteric. Reactive to light. Ears, Nose, Mouth, and Throat Lips, teeth, and gums WNL.Marland Kitchen Moist mucosa without lesions. Neck supple and nontender. No palpable supraclavicular or cervical adenopathy. Normal sized without goiter. Respiratory WNL. No retractions.. Cardiovascular Pedal Pulses WNL. No clubbing, cyanosis or edema. Lymphatic No adneopathy. No adenopathy. No adenopathy. Musculoskeletal Adexa without tenderness or enlargement.. Digits and nails w/o clubbing, cyanosis, infection, petechiae, ischemia, or inflammatory conditions.Marland Kitchen Psychiatric Judgement and insight Intact.. No evidence of depression, anxiety, or agitation.. General Notes: the wound looks very clean and there is no evidence of any surrounding cellulitis or purulent drainage. There is no probing down to bone today. Integumentary (Hair, Skin) No suspicious lesions. No crepitus or fluctuance. No peri-wound warmth or erythema. No masses.. Wound #1 status is Open. Original cause of wound was Gradually Appeared. The wound is located on the Right Toe Second. The wound measures 0.3cm length x 0.4cm width x 0.1cm depth; 0.094cm^2 area and 0.009cm^3 volume. There is bone and Fat Layer (Subcutaneous Tissue) Exposed exposed. There is no tunneling or undermining noted. There is a medium amount of purulent drainage noted. The wound margin is flat and intact. There is large (67-100%) pink granulation within the wound bed. There is a small (1-33%) amount of necrotic tissue within the wound bed including Adherent Slough. The periwound skin appearance did not exhibit: Callus, Crepitus, Excoriation, Induration, Rash, Scarring, Dry/Scaly, Maceration, Atrophie Blanche, Cyanosis, Ecchymosis, Hemosiderin Staining, Mottled, Pallor, Rubor, Erythema. Periwound temperature was noted as No  Abnormality. The periwound has tenderness on palpation. Assessment Active Problems ICD-10 L97.512 - Non-pressure chronic ulcer of other part of right foot with fat layer exposed I73.9 - Peripheral vascular disease, unspecified I10 - Essential (primary) hypertension Vicencio, [name] L. ([id number]) Plan Wound Cleansing: Wound #1 Right Toe Second: Clean wound with Normal Saline. Anesthetic: Wound #1 Right Toe Second: Topical Lidocaine 4% cream applied to wound bed prior to debridement Primary Wound Dressing: Wound #1 Right Toe Second: Prisma Ag Secondary Dressing: Wound #1 Right Toe Second: Gauze and Kerlix/Conform - or bandaid or coverlet Dressing Change Frequency: Wound #1 Right Toe Second: Other: - change the bandage on shower days or if soiled Follow-up Appointments: Wound #1 Right Toe Second: Return Appointment in 1 week. Off-Loading: Wound #1 Right Toe Second: Other: - continue wearing  shoes that do not rub the top of the toe Additional Orders / Instructions: Wound #1 Right Toe Second: Increase protein intake. Other: - Please add vitamin A, vitamin C and zinc supplements to your diet After review today I have recommended: 1. Prisma AG with a bordered foam to be applied every other [name] 2. Offloading is been discussed in great detail 3. Adequate protein, vitamin A, vitamin C and zinc 4. We have discussed with her caregiver that at this stage I'm not asking for vascular studies. Electronic Signature(s) Signed: 05/04/2017 10:22:26 AM By: Evlyn Kanner MD, FACS Entered By: Evlyn Kanner on 05/04/2017 10:22:26 [name] [name] [name] ([id number]) -------------------------------------------------------------------------------- SuperBill Details Patient Name: [name] [name] L. Date of Service: 05/04/2017 Medical Record Number: [id number] Patient Account Number: [id number] Date of Birth/Sex: [birth year] (83 y.o. Female) Treating RN: Curtis Sites Primary Care Provider: Dewaine Oats Other  Clinician: Referring Provider: Dewaine Oats Treating Provider/Extender: Rudene Re in Treatment: 4 Diagnosis Coding ICD-10 Codes Code Description L97.512 Non-pressure chronic ulcer of other part of right foot with fat layer exposed I73.9 Peripheral vascular disease, unspecified I10 Essential (primary) hypertension Facility Procedures CPT4 Code: [id number] Description: 99213 - WOUND CARE VISIT-LEV 3 EST PT Modifier: Quantity: 1 Physician Procedures CPT4 Code: [id number] Description: 99213 - WC PHYS LEVEL 3 - EST PT ICD-10 Diagnosis Description L97.512 Non-pressure chronic ulcer of other part of right foot with fat I73.9 Peripheral vascular disease, unspecified I10 Essential (primary) hypertension Modifier: layer exposed Quantity: 1 Electronic Signature(s) Signed: 05/04/2017 1:18:37 PM By: Curtis Sites Signed: 05/04/2017 4:07:58 PM By: Evlyn Kanner MD, FACS Previous Signature: 05/04/2017 10:22:37 AM Version By: Evlyn Kanner MD, FACS Entered By: Curtis Sites on 05/04/2017 13:18:37

## 2017-05-07 NOTE — Progress Notes (Signed)
[name] ([id number]) Visit Report for 05/04/2017 Arrival Information Details Patient Name: [name] Date of Service: 05/04/2017 9:15 AM Medical Record Number: [id number] Patient Account Number: [id number] Date of Birth/Sex: 12-05-1920 (83 y.o. Female) Treating RN: Curtis Sites Primary Care Nakai Yard: Dewaine Oats Other Clinician: Referring Babacar Haycraft: Dewaine Oats Treating Jakylah Bassinger/Extender: Rudene Re in Treatment: 4 Visit Information History Since Last Visit Added or deleted any medications: No Patient Arrived: Wheel Chair Any new allergies or adverse reactions: No Arrival Time: 09:29 Had a fall or experienced change in No Accompanied By: staff activities of daily living that may affect Transfer Assistance: None risk of falls: Patient Identification Verified: Yes Signs or symptoms of abuse/neglect since last visito No Secondary Verification Process Yes Hospitalized since last visit: No Completed: Has Dressing in Place as Prescribed: Yes Patient Has Alerts: Yes Pain Present Now: No Patient Alerts: unable to obtain ABI R/T inaudible pulses Electronic Signature(s) Signed: 05/04/2017 4:04:29 PM By: Curtis Sites Entered By: Curtis Sites on 05/04/2017 09:32:05 [name] Kitchen ([id number]) -------------------------------------------------------------------------------- Clinic Level of Care Assessment Details Patient Name: [name] Date of Service: 05/04/2017 9:15 AM Medical Record Number: [id number] Patient Account Number: [id number] Date of Birth/Sex: Aug 23, 1920 (83 y.o. Female) Treating RN: Curtis Sites Primary Care Niel Peretti: TATE, Katherina Right Other Clinician: Referring Shraddha Lebron: Dewaine Oats Treating Kyoko Elsea/Extender: Rudene Re in Treatment: 4 Clinic Level of Care Assessment Items TOOL 4 Quantity Score []  - Use when only an EandM is performed on FOLLOW-UP visit 0 ASSESSMENTS - Nursing Assessment / Reassessment X - Reassessment of  Co-morbidities (includes updates in patient status) 1 10 X- 1 5 Reassessment of Adherence to Treatment Plan ASSESSMENTS - Wound and Skin Assessment / Reassessment X - Simple Wound Assessment / Reassessment - one wound 1 5 []  - 0 Complex Wound Assessment / Reassessment - multiple wounds []  - 0 Dermatologic / Skin Assessment (not related to wound area) ASSESSMENTS - Focused Assessment []  - Circumferential Edema Measurements - multi extremities 0 []  - 0 Nutritional Assessment / Counseling / Intervention X- 1 5 Lower Extremity Assessment (monofilament, tuning fork, pulses) []  - 0 Peripheral Arterial Disease Assessment (using hand held doppler) ASSESSMENTS - Ostomy and/or Continence Assessment and Care []  - Incontinence Assessment and Management 0 []  - 0 Ostomy Care Assessment and Management (repouching, etc.) PROCESS - Coordination of Care X - Simple Patient / Family Education for ongoing care 1 15 []  - 0 Complex (extensive) Patient / Family Education for ongoing care []  - 0 Staff obtains Chiropractor, Records, Test Results / Process Orders []  - 0 Staff telephones HHA, Nursing Homes / Clarify orders / etc []  - 0 Routine Transfer to another Facility (non-emergent condition) []  - 0 Routine Hospital Admission (non-emergent condition) []  - 0 New Admissions / Manufacturing engineer / Ordering NPWT, Apligraf, etc. []  - 0 Emergency Hospital Admission (emergent condition) X- 1 10 Simple Discharge Coordination [name] ([id number]) []  - 0 Complex (extensive) Discharge Coordination PROCESS - Special Needs []  - Pediatric / Minor Patient Management 0 []  - 0 Isolation Patient Management []  - 0 Hearing / Language / Visual special needs []  - 0 Assessment of Community assistance (transportation, D/C planning, etc.) []  - 0 Additional assistance / Altered mentation []  - 0 Support Surface(s) Assessment (bed, cushion, seat, etc.) INTERVENTIONS - Wound Cleansing / Measurement X -  Simple Wound Cleansing - one wound 1 5 []  - 0 Complex Wound Cleansing - multiple wounds X- 1 5 Wound Imaging (photographs - any number of wounds) []  -  0 Wound Tracing (instead of photographs) X- 1 5 Simple Wound Measurement - one wound []  - 0 Complex Wound Measurement - multiple wounds INTERVENTIONS - Wound Dressings X - Small Wound Dressing one or multiple wounds 1 10 []  - 0 Medium Wound Dressing one or multiple wounds []  - 0 Large Wound Dressing one or multiple wounds []  - 0 Application of Medications - topical []  - 0 Application of Medications - injection INTERVENTIONS - Miscellaneous []  - External ear exam 0 []  - 0 Specimen Collection (cultures, biopsies, blood, body fluids, etc.) []  - 0 Specimen(s) / Culture(s) sent or taken to Lab for analysis []  - 0 Patient Transfer (multiple staff / Nurse, adult / Similar devices) []  - 0 Simple Staple / Suture removal (25 or less) []  - 0 Complex Staple / Suture removal (26 or more) []  - 0 Hypo / Hyperglycemic Management (close monitor of Blood Glucose) []  - 0 Ankle / Brachial Index (ABI) - do not check if billed separately X- 1 5 Vital Signs [name] ([id number]) Has the patient been seen at the hospital within the last three years: Yes Total Score: 80 Level Of Care: New/Established - Level 3 Electronic Signature(s) Signed: 05/04/2017 4:04:29 PM By: Curtis Sites Entered By: Curtis Sites on 05/04/2017 13:18:27 [name] ([id number]) -------------------------------------------------------------------------------- Encounter Discharge Information Details Patient Name: [name] Date of Service: 05/04/2017 9:15 AM Medical Record Number: [id number] Patient Account Number: [id number] Date of Birth/Sex: 20-Apr-1921 (83 y.o. Female) Treating RN: Curtis Sites Primary Care Javarious Elsayed: Dewaine Oats Other Clinician: Referring Providencia Hottenstein: Dewaine Oats Treating Jeremyah Jelley/Extender: Rudene Re in Treatment:  4 Encounter Discharge Information Items Discharge Pain Level: 0 Discharge Condition: Stable Ambulatory Status: Wheelchair Discharge Destination: Home Transportation: Private Auto Accompanied By: staff Schedule Follow-up Appointment: Yes Medication Reconciliation completed and No provided to Patient/Care Lannie Heaps: Provided on Clinical Summary of Care: 05/04/2017 Form Type Recipient Paper Patient [facility] Electronic Signature(s) Signed: 05/07/2017 9:18:03 AM By: Gwenlyn Perking Entered By: Gwenlyn Perking on 05/04/2017 09:58:20 [name] ([id number]) -------------------------------------------------------------------------------- Lower Extremity Assessment Details Patient Name: [name] Date of Service: 05/04/2017 9:15 AM Medical Record Number: [id number] Patient Account Number: [id number] Date of Birth/Sex: January 29, 1921 (83 y.o. Female) Treating RN: Curtis Sites Primary Care Felica Chargois: Dewaine Oats Other Clinician: Referring Szymon Foiles: Dewaine Oats Treating Neilan Rizzo/Extender: Rudene Re in Treatment: 4 Vascular Assessment Pulses: Dorsalis Pedis Palpable: [Right:Yes] Posterior Tibial Extremity colors, hair growth, and conditions: Extremity Color: [Right:Hyperpigmented] Hair Growth on Extremity: [Right:No] Temperature of Extremity: [Right:Warm] Capillary Refill: [Right:< 3 seconds] Electronic Signature(s) Signed: 05/04/2017 4:04:29 PM By: Curtis Sites Entered By: Curtis Sites on 05/04/2017 09:39:40 Gullickson, Chenel [name]. ([id number]) -------------------------------------------------------------------------------- Multi Wound Chart Details Patient Name: [name] Date of Service: 05/04/2017 9:15 AM Medical Record Number: [id number] Patient Account Number: [id number] Date of Birth/Sex: April 23, 1921 (83 y.o. Female) Treating RN: Curtis Sites Primary Care Dwanna Goshert: TATE, Katherina Right Other Clinician: Referring Ardith Lewman: Dewaine Oats Treating Dalan Cowger/Extender: Rudene Re in Treatment: 4 Vital Signs Height(in): Pulse(bpm): 54 Weight(lbs): Blood Pressure(mmHg): 159/67 Body Mass Index(BMI): Temperature(F): 97.9 Respiratory Rate 16 (breaths/[name]): Photos: [1:No Photos] [N/A:N/A] Wound Location: [1:Right Toe Second] [N/A:N/A] Wounding Event: [1:Gradually Appeared] [N/A:N/A] Primary Etiology: [1:Arterial Insufficiency Ulcer] [N/A:N/A] Comorbid History: [1:Hypertension, Gout, Osteoarthritis] [N/A:N/A] Date Acquired: [1:12/26/2016] [N/A:N/A] Weeks of Treatment: [1:4] [N/A:N/A] Wound Status: [1:Open] [N/A:N/A] Pending Amputation on [1:Yes] [N/A:N/A] Presentation: Measurements [name] x W x D [1:0.3x0.4x0.1] [N/A:N/A] (cm) Area (cm) : [1:0.094] [N/A:N/A] Volume (cm) : [1:0.009] [N/A:N/A] % Reduction in Area: [1:0.00%] [N/A:N/A] % Reduction in  Volume: [1:0.00%] [N/A:N/A] Classification: [1:Full Thickness With Exposed Support Structures] [N/A:N/A] Exudate Amount: [1:Medium] [N/A:N/A] Exudate Type: [1:Purulent] [N/A:N/A] Exudate Color: [1:yellow, brown, green] [N/A:N/A] Wound Margin: [1:Flat and Intact] [N/A:N/A] Granulation Amount: [1:Large (67-100%)] [N/A:N/A] Granulation Quality: [1:Pink] [N/A:N/A] Necrotic Amount: [1:Small (1-33%)] [N/A:N/A] Exposed Structures: [1:Fat Layer (Subcutaneous Tissue) Exposed: Yes Bone: Yes Fascia: No Tendon: No Muscle: No Joint: No] [N/A:N/A] Epithelialization: [1:None] [N/A:N/A] Periwound Skin Texture: [1:Excoriation: No Induration: No Callus: No] [N/A:N/A] Crepitus: No Rash: No Scarring: No Periwound Skin Moisture: Maceration: No N/A N/A Dry/Scaly: No Periwound Skin Color: Atrophie Blanche: No N/A N/A Cyanosis: No Ecchymosis: No Erythema: No Hemosiderin Staining: No Mottled: No Pallor: No Rubor: No Temperature: No Abnormality N/A N/A Tenderness on Palpation: Yes N/A N/A Wound Preparation: Ulcer Cleansing: N/A N/A Rinsed/Irrigated with Saline Topical Anesthetic Applied: Other: lidocaine  4% Treatment Notes Electronic Signature(s) Signed: 05/04/2017 10:20:43 AM By: Evlyn Kanner MD, FACS Entered By: Evlyn Kanner on 05/04/2017 10:20:43 Heinemann, [name] ([id number]) -------------------------------------------------------------------------------- Multi-Disciplinary Care Plan Details Patient Name: [name] Date of Service: 05/04/2017 9:15 AM Medical Record Number: [id number] Patient Account Number: [id number] Date of Birth/Sex: 05-Apr-1921 (83 y.o. Female) Treating RN: Curtis Sites Primary Care Lashawnta Burgert: Dewaine Oats Other Clinician: Referring Domani Bakos: Dewaine Oats Treating Austina Constantin/Extender: Rudene Re in Treatment: 4 Active Inactive ` Abuse / Safety / Falls / Self Care Management Nursing Diagnoses: Impaired physical mobility Goals: Patient will remain injury free related to falls Date Initiated: 04/06/2017 Target Resolution Date: 06/22/2017 Goal Status: Active Interventions: Assess fall risk on admission and as needed Notes: ` Orientation to the Wound Care Program Nursing Diagnoses: Knowledge deficit related to the wound healing center program Goals: Patient/caregiver will verbalize understanding of the Wound Healing Center Program Date Initiated: 04/06/2017 Target Resolution Date: 06/22/2017 Goal Status: Active Interventions: Provide education on orientation to the wound center Notes: ` Wound/Skin Impairment Nursing Diagnoses: Impaired tissue integrity Goals: Ulcer/skin breakdown will heal within 14 weeks Date Initiated: 04/06/2017 Target Resolution Date: 06/22/2017 Goal Status: Active Interventions: MAKENZI, BANNISTER ([id number]) Assess patient/caregiver ability to obtain necessary supplies Assess patient/caregiver ability to perform ulcer/skin care regimen upon admission and as needed Assess ulceration(s) every visit Notes: Electronic Signature(s) Signed: 05/04/2017 4:04:29 PM By: Curtis Sites Entered By: Curtis Sites on 05/04/2017  09:39:44 Brener, Ailah [name]. ([id number]) -------------------------------------------------------------------------------- Pain Assessment Details Patient Name: [name] Date of Service: 05/04/2017 9:15 AM Medical Record Number: [id number] Patient Account Number: [id number] Date of Birth/Sex: 1920-12-29 (83 y.o. Female) Treating RN: Curtis Sites Primary Care Shailey Butterbaugh: Dewaine Oats Other Clinician: Referring Esmond Hinch: Dewaine Oats Treating Evora Schechter/Extender: Rudene Re in Treatment: 4 Active Problems Location of Pain Severity and Description of Pain Patient Has Paino No Site Locations Pain Management and Medication Current Pain Management: Notes Topical or injectable lidocaine is offered to patient for acute pain when surgical debridement is performed. If needed, Patient is instructed to use over the counter pain medication for the following 24-48 hours after debridement. Wound care MDs do not prescribed pain medications. Patient has chronic pain or uncontrolled pain. Patient has been instructed to make an appointment with their Primary Care Physician for pain management. Electronic Signature(s) Signed: 05/04/2017 4:04:29 PM By: Curtis Sites Entered By: Curtis Sites on 05/04/2017 09:32:24 [name], [name] ([id number]) -------------------------------------------------------------------------------- Patient/Caregiver Education Details Patient Name: [name] Date of Service: 05/04/2017 9:15 AM Medical Record Number: [id number] Patient Account Number: [id number] Date of Birth/Gender: [birth year] (83 y.o. Female) Treating RN: Curtis Sites Primary Care Physician: Dewaine Oats Other Clinician: Referring Physician: Dewaine Oats  Treating Physician/Extender: Rudene ReBritto, Errol Weeks in Treatment: 4 Education Assessment Education Provided To: Caregiver Education Topics Provided Wound/Skin Impairment: Handouts: Other: wound care as ordered Methods: Demonstration,  Explain/Verbal Responses: State content correctly Electronic Signature(s) Signed: 05/04/2017 4:04:29 PM By: Curtis Sitesorthy, Joanna Entered By: Curtis Sitesorthy, Joanna on 05/04/2017 09:43:15 Minahan, [name] ([id number]) -------------------------------------------------------------------------------- Wound Assessment Details Patient Name: Edelen, Baelyn [name]. Date of Service: 05/04/2017 9:15 AM Medical Record Number: [id number] Patient Account Number: [id number] Date of Birth/Sex: [birth year] (83 y.o. Female) Treating RN: Curtis Sitesorthy, Joanna Primary Care Javon Snee: Dewaine OatsATE, DENNY Other Clinician: Referring Tierre Netto: Dewaine OatsATE, DENNY Treating Cynthia Cogle/Extender: Rudene ReBritto, Errol Weeks in Treatment: 4 Wound Status Wound Number: 1 Primary Etiology: Arterial Insufficiency Ulcer Wound Location: Right Toe Second Wound Status: Open Wounding Event: Gradually Appeared Comorbid History: Hypertension, Gout, Osteoarthritis Date Acquired: 12/26/2016 Weeks Of Treatment: 4 Clustered Wound: No Pending Amputation On Presentation Photos Photo Uploaded By: Curtis Sitesorthy, Joanna on 05/04/2017 13:25:02 Wound Measurements Length: (cm) 0.3 Width: (cm) 0.4 Depth: (cm) 0.1 Area: (cm) 0.094 Volume: (cm) 0.009 % Reduction in Area: 0% % Reduction in Volume: 0% Epithelialization: None Tunneling: No Undermining: No Wound Description Full Thickness With Exposed Support Classification: Structures Wound Margin: Flat and Intact Exudate Medium Amount: Exudate Type: Purulent Exudate Color: yellow, brown, green Foul Odor After Cleansing: No Slough/Fibrino Yes Wound Bed Granulation Amount: Large (67-100%) Exposed Structure Granulation Quality: Pink Fascia Exposed: No Necrotic Amount: Small (1-33%) Fat Layer (Subcutaneous Tissue) Exposed: Yes Necrotic Quality: Adherent Slough Tendon Exposed: No Muscle Exposed: No Joint Exposed: No Sloan, Valbona [name]. ([id number]) Bone Exposed: Yes Periwound Skin Texture Texture Color No Abnormalities Noted:  No No Abnormalities Noted: No Callus: No Atrophie Blanche: No Crepitus: No Cyanosis: No Excoriation: No Ecchymosis: No Induration: No Erythema: No Rash: No Hemosiderin Staining: No Scarring: No Mottled: No Pallor: No Moisture Rubor: No No Abnormalities Noted: No Dry / Scaly: No Temperature / Pain Maceration: No Temperature: No Abnormality Tenderness on Palpation: Yes Wound Preparation Ulcer Cleansing: Rinsed/Irrigated with Saline Topical Anesthetic Applied: Other: lidocaine 4%, Treatment Notes Wound #1 (Right Toe Second) 1. Cleansed with: Clean wound with Normal Saline 4. Dressing Applied: Prisma Ag Other dressing (specify in notes) Notes coverlet Electronic Signature(s) Signed: 05/04/2017 4:04:29 PM By: Curtis Sitesorthy, Joanna Entered By: Curtis Sitesorthy, Joanna on 05/04/2017 09:38:07 Capitano, Mercy [name]. ([id number]) -------------------------------------------------------------------------------- Vitals Details Patient Name: Frary, Marvine [name]. Date of Service: 05/04/2017 9:15 AM Medical Record Number: [id number] Patient Account Number: [id number] Date of Birth/Sex: [birth year] (83 y.o. Female) Treating RN: Curtis Sitesorthy, Joanna Primary Care Chaun Uemura: TATE, Katherina RightENNY Other Clinician: Referring Shyam Dawson: Dewaine OatsATE, DENNY Treating Linh Johannes/Extender: Rudene ReBritto, Errol Weeks in Treatment: 4 Vital Signs Time Taken: 09:32 Temperature (F): 97.9 Pulse (bpm): 54 Respiratory Rate (breaths/[name]): 16 Blood Pressure (mmHg): 159/67 Reference Range: 80 - 120 mg / dl Electronic Signature(s) Signed: 05/04/2017 4:04:29 PM By: Curtis Sitesorthy, Joanna Entered By: Curtis Sitesorthy, Joanna on 05/04/2017 09:32:56

## 2017-05-11 ENCOUNTER — Encounter: Payer: Medicare Other | Admitting: Physician Assistant

## 2017-05-11 DIAGNOSIS — L97512 Non-pressure chronic ulcer of other part of right foot with fat layer exposed: Secondary | ICD-10-CM | POA: Diagnosis not present

## 2017-05-14 NOTE — Progress Notes (Signed)
KYNSIE, FALKNER ([id number]) Visit Report for 05/11/2017 Chief Complaint Document Details Patient Name: [name], [name] Date of Service: 05/11/2017 11:00 AM Medical Record Number: [id number] Patient Account Number: [id number] Date of Birth/Sex: [birth year] (83 y.o. Female) Treating RN: Curtis Sites Primary Care Provider: Dewaine Oats Other Clinician: Referring Provider: Dewaine Oats Treating Provider/Extender: Linwood Dibbles, HOYT Weeks in Treatment: 5 Information Obtained from: Patient Chief Complaint Patient seen for complaints of Non-Healing Wound to the right second toe Electronic Signature(s) Signed: 05/11/2017 5:05:22 PM By: Lenda Kelp PA-C Entered By: Lenda Kelp on 05/11/2017 11:23:07 Quintanar, Ronia L. ([id number]) -------------------------------------------------------------------------------- HPI Details Patient Name: [name] Bayley L. Date of Service: 05/11/2017 11:00 AM Medical Record Number: [id number] Patient Account Number: [id number] Date of Birth/Sex: [birth year] (83 y.o. Female) Treating RN: Curtis Sites Primary Care Provider: Dewaine Oats Other Clinician: Referring Provider: Dewaine Oats Treating Provider/Extender: STONE III, HOYT Weeks in Treatment: 5 History of Present Illness Location: right second toe Quality: Patient reports No Pain. Severity: Patient states wound are getting worse. Duration: Patient has had the wound for > 3 months prior to seeking treatment at the wound center Context: The wound would happen gradually Modifying Factors: Other treatment(s) tried include:asked her to wash the foot, soak it in Epsom salt and apply some Bactroban and offload it. HPI Description: very pleasant 83 year old patient, no history of diabetes or smoking has had a ulceration on the right second toe dorsum for about 3 months. Besides essential hypertension and gout she does not have any other significant problems and has never been a smoker. 04/13/17 on evaluation today  patient appears to be doing about the same in regard to her right second toe ulcer. We did get the results for x-ray which showed osteopenia without any specific bone abnormality osteomyelitis. She does continue to have discomfort with palpation of the wound although secondary to mental status is unable to rate or describe this pain. No fevers, chills, nausea, or vomiting noted at this time. 04/27/2017 -- over the last couple of weeks the patient has had some issues with a urinary tract infection and some mental status changes and after review today, I have recommended we do not do the arterial studies recommended by my colleague last week, in view of the fact that the patient's wound has improved considerably and I do not want to put her through further investigations, which may not change the treatment plan. 05/04/2017 -- the patient was seen by her podiatrist and also has a CT scan pending to workup her recent mental status changes. 05/13/17 on evaluation today patient appears to be doing about the same. The wound is not significantly smaller although there is more granulation than when I last saw this. We are continuing to manage this more conservatively as patient's family does not want to delve deeper into a lot of testing they would prefer to see how the wound progresses. Fortunately there is no evidence of infection. No fevers, chills, nausea, or vomiting noted at this time. She has been tolerating the dressing changes without complication and has only minimal discomfort that she is unable to rate or describe her pain secondary to mental status. Electronic Signature(s) Signed: 05/11/2017 5:05:22 PM By: Lenda Kelp PA-C Entered By: Lenda Kelp on 05/11/2017 12:50:07 Berrong, [name] ([id number]) -------------------------------------------------------------------------------- Physical Exam Details Patient Name: [name], [name] Date of Service: 05/11/2017 11:00 AM Medical Record  Number: [id number] Patient Account Number: [id number] Date of Birth/Sex: 10/20/1920 (83 y.o. Female) Treating RN: Curtis Sites Primary  Care Provider: Dewaine Oats Other Clinician: Referring Provider: TATE, Katherina Right Treating Provider/Extender: STONE III, HOYT Weeks in Treatment: 5 Constitutional Thin and well-hydrated in no acute distress. Respiratory normal breathing without difficulty. clear to auscultation bilaterally. Cardiovascular regular rate and rhythm with normal S1, S2. Psychiatric Patient is not able to cooperate in decision making regarding care. Patient is oriented to person only. pleasant and cooperative. Notes Patient has granulation tissue covering the majority of the wound bed although distally there is still some bone exposure. No debridement was performed or necessary. Electronic Signature(s) Signed: 05/11/2017 5:05:22 PM By: Lenda Kelp PA-C Entered By: Lenda Kelp on 05/11/2017 12:51:14 Zellmer, [name] ([id number]) -------------------------------------------------------------------------------- Physician Orders Details Patient Name: [name] Bayley L. Date of Service: 05/11/2017 11:00 AM Medical Record Number: [id number] Patient Account Number: [id number] Date of Birth/Sex: Feb 24, 1921 (83 y.o. Female) Treating RN: Curtis Sites Primary Care Provider: Dewaine Oats Other Clinician: Referring Provider: Dewaine Oats Treating Provider/Extender: Linwood Dibbles, HOYT Weeks in Treatment: 5 Verbal / Phone Orders: No Diagnosis Coding ICD-10 Coding Code Description L97.512 Non-pressure chronic ulcer of other part of right foot with fat layer exposed I73.9 Peripheral vascular disease, unspecified I10 Essential (primary) hypertension Wound Cleansing Wound #1 Right Toe Second o Clean wound with Normal Saline. Anesthetic Wound #1 Right Toe Second o Topical Lidocaine 4% cream applied to wound bed prior to debridement Primary Wound Dressing Wound #1 Right Toe  Second o Prisma Ag Secondary Dressing Wound #1 Right Toe Second o Gauze and Kerlix/Conform - or bandaid or coverlet Dressing Change Frequency Wound #1 Right Toe Second o Other: - change the bandage on shower days or if soiled Follow-up Appointments Wound #1 Right Toe Second o Return Appointment in 1 week. Off-Loading Wound #1 Right Toe Second o Other: - continue wearing shoes that do not rub the top of the toe Additional Orders / Instructions Wound #1 Right Toe Second o Increase protein intake. o Other: - Please add vitamin A, vitamin C and zinc supplements to your diet JAYLEENE, GLAESER ([id number]) Electronic Signature(s) Signed: 05/11/2017 4:32:17 PM By: Curtis Sites Signed: 05/11/2017 5:05:22 PM By: Lenda Kelp PA-C Entered By: Curtis Sites on 05/11/2017 11:42:50 Horwitz, Shivonne L. ([id number]) -------------------------------------------------------------------------------- Problem List Details Patient Name: Noller, Delisia L. Date of Service: 05/11/2017 11:00 AM Medical Record Number: [id number] Patient Account Number: [id number] Date of Birth/Sex: 07-01-1921 (83 y.o. Female) Treating RN: Curtis Sites Primary Care Provider: Dewaine Oats Other Clinician: Referring Provider: Dewaine Oats Treating Provider/Extender: Linwood Dibbles, HOYT Weeks in Treatment: 5 Active Problems ICD-10 Encounter Code Description Active Date Diagnosis L97.512 Non-pressure chronic ulcer of other part of right foot with fat layer 04/06/2017 Yes exposed I73.9 Peripheral vascular disease, unspecified 04/06/2017 Yes I10 Essential (primary) hypertension 04/06/2017 Yes Inactive Problems Resolved Problems Electronic Signature(s) Signed: 05/11/2017 5:05:22 PM By: Lenda Kelp PA-C Entered By: Lenda Kelp on 05/11/2017 11:22:59 Barich, Kinslei L. ([id number]) -------------------------------------------------------------------------------- Progress Note Details Patient Name: Greenslade, Zakia  L. Date of Service: 05/11/2017 11:00 AM Medical Record Number: [id number] Patient Account Number: [id number] Date of Birth/Sex: 1921-03-14 (83 y.o. Female) Treating RN: Curtis Sites Primary Care Provider: Dewaine Oats Other Clinician: Referring Provider: TATE, Katherina Right Treating Provider/Extender: Linwood Dibbles, HOYT Weeks in Treatment: 5 Subjective Chief Complaint Information obtained from Patient Patient seen for complaints of Non-Healing Wound to the right second toe History of Present Illness (HPI) The following HPI elements were documented for the patient's wound: Location: right second toe Quality: Patient reports No Pain. Severity: Patient states wound are getting  worse. Duration: Patient has had the wound for > 3 months prior to seeking treatment at the wound center Context: The wound would happen gradually Modifying Factors: Other treatment(s) tried include:asked her to wash the foot, soak it in Epsom salt and apply some Bactroban and offload it. very pleasant 83 year old patient, no history of diabetes or smoking has had a ulceration on the right second toe dorsum for about 3 months. Besides essential hypertension and gout she does not have any other significant problems and has never been a smoker. 04/13/17 on evaluation today patient appears to be doing about the same in regard to her right second toe ulcer. We did get the results for x-ray which showed osteopenia without any specific bone abnormality osteomyelitis. She does continue to have discomfort with palpation of the wound although secondary to mental status is unable to rate or describe this pain. No fevers, chills, nausea, or vomiting noted at this time. 04/27/2017 -- over the last couple of weeks the patient has had some issues with a urinary tract infection and some mental status changes and after review today, I have recommended we do not do the arterial studies recommended by my colleague last week, in view of the fact  that the patient's wound has improved considerably and I do not want to put her through further investigations, which may not change the treatment plan. 05/04/2017 -- the patient was seen by her podiatrist and also has a CT scan pending to workup her recent mental status changes. 05/13/17 on evaluation today patient appears to be doing about the same. The wound is not significantly smaller although there is more granulation than when I last saw this. We are continuing to manage this more conservatively as patient's family does not want to delve deeper into a lot of testing they would prefer to see how the wound progresses. Fortunately there is no evidence of infection. No fevers, chills, nausea, or vomiting noted at this time. She has been tolerating the dressing changes without complication and has only minimal discomfort that she is unable to rate or describe her pain secondary to mental status. Patient History Information obtained from Patient. Social History Never smoker, Marital Status - Widowed, Alcohol Use - Never, Drug Use - No History, Caffeine Use - Daily. Review of Systems (ROS) Toda, Jameya L. ([id number]) Constitutional Symptoms (General Health) Denies complaints or symptoms of Fever, Chills. Respiratory The patient has no complaints or symptoms. Cardiovascular The patient has no complaints or symptoms. Psychiatric Denies complaints or symptoms of Anxiety. General Notes: [place] to mental status patient is unable to provide a complete review of systems, social history, family history, and past medical history. Objective Constitutional Thin and well-hydrated in no acute distress. Vitals Time Taken: 11:12 AM, Temperature: 98.3 F, Pulse: 47 bpm, Respiratory Rate: 16 breaths/min, Blood Pressure: 153/41 mmHg. Respiratory normal breathing without difficulty. clear to auscultation bilaterally. Cardiovascular regular rate and rhythm with normal S1, S2. Psychiatric Patient  is not able to cooperate in decision making regarding care. Patient is oriented to person only. pleasant and cooperative. General Notes: Patient has granulation tissue covering the majority of the wound bed although distally there is still some bone exposure. No debridement was performed or necessary. Integumentary (Hair, Skin) Wound #1 status is Open. Original cause of wound was Gradually Appeared. The wound is located on the Right Toe Second. The wound measures 0.4cm length x 0.5cm width x 0.1cm depth; 0.157cm^2 area and 0.016cm^3 volume. There is bone and Fat Layer (Subcutaneous Tissue)  Exposed exposed. There is no tunneling or undermining noted. There is a medium amount of purulent drainage noted. The wound margin is flat and intact. There is large (67-100%) pink granulation within the wound bed. There is a small (1-33%) amount of necrotic tissue within the wound bed including Adherent Slough. The periwound skin appearance did not exhibit: Callus, Crepitus, Excoriation, Induration, Rash, Scarring, Dry/Scaly, Maceration, Atrophie Blanche, Cyanosis, Ecchymosis, Hemosiderin Staining, Mottled, Pallor, Rubor, Erythema. Periwound temperature was noted as No Abnormality. The periwound has tenderness on palpation. Assessment Gorsline, Maegen L. ([id number]) Active Problems ICD-10 L97.512 - Non-pressure chronic ulcer of other part of right foot with fat layer exposed I73.9 - Peripheral vascular disease, unspecified I10 - Essential (primary) hypertension Plan Wound Cleansing: Wound #1 Right Toe Second: Clean wound with Normal Saline. Anesthetic: Wound #1 Right Toe Second: Topical Lidocaine 4% cream applied to wound bed prior to debridement Primary Wound Dressing: Wound #1 Right Toe Second: Prisma Ag Secondary Dressing: Wound #1 Right Toe Second: Gauze and Kerlix/Conform - or bandaid or coverlet Dressing Change Frequency: Wound #1 Right Toe Second: Other: - change the bandage on shower days or  if soiled Follow-up Appointments: Wound #1 Right Toe Second: Return Appointment in 1 week. Off-Loading: Wound #1 Right Toe Second: Other: - continue wearing shoes that do not rub the top of the toe Additional Orders / Instructions: Wound #1 Right Toe Second: Increase protein intake. Other: - Please add vitamin A, vitamin C and zinc supplements to your diet I am going to recommend that we continue with the Current wound care measures including the collagen dressing for the next week. Hopefully patient will continue to show signs of good granulation. Please see above for specific wound care orders. We will see her for reevaluation in one week. If anything worsens in the interim patient's caregivers can contact us for additional recommendations. Electronic Signature(s) Signed: 05/11/2017 5:05:22 PM By: Lenda KelpStone III, Hoyt PA-C Entered By: Lenda KelpStone III, Hoyt on 05/11/2017 12:51:54 Manske, Tatisha Elbert EwingsL. ([id number]) -------------------------------------------------------------------------------- ROS/PFSH Details Patient Name: [name] BayleyHARDY, Rozalia L. Date of Service: 05/11/2017 11:00 AM Medical Record Number: [id number] Patient Account Number: [id number] Date of Birth/Sex: [birth year] (83 y.o. Female) Treating RN: Curtis Sitesorthy, Joanna Primary Care Provider: TATE, Katherina RightENNY Other Clinician: Referring Provider: Dewaine OatsATE, DENNY Treating Provider/Extender: Linwood DibblesSTONE III, HOYT Weeks in Treatment: 5 Information Obtained From Patient Wound History Do you currently have one or more open woundso Yes How many open wounds do you currently haveo 1 Approximately how long have you had your woundso 3 months How have you been treating your wound(s) until nowo neosporin Has your wound(s) ever healed and then re-openedo No Have you had any lab work done in the past montho No Have you tested positive for an antibiotic resistant organism (MRSA, VRE)o No Have you tested positive for osteomyelitis (bone infection)o No Have you had any tests for  circulation on your legso No Constitutional Symptoms (General Health) Complaints and Symptoms: Negative for: Fever; Chills Psychiatric Complaints and Symptoms: Negative for: Anxiety Respiratory Complaints and Symptoms: No Complaints or Symptoms Cardiovascular Complaints and Symptoms: No Complaints or Symptoms Medical History: Positive for: Hypertension Musculoskeletal Medical History: Positive for: Gout; Osteoarthritis Immunizations Pneumococcal Vaccine: Received Pneumococcal Vaccination: Yes Immunization Notes: up to date Implantable Devices Gary FleetHARDY, Lakedra L. ([id number]) Family and Social History Never smoker; Marital Status - Widowed; Alcohol Use: Never; Drug Use: No History; Caffeine Use: Daily; Financial Concerns: No; Food, Clothing or Shelter Needs: No; Support System Lacking: No; Transportation Concerns: No; Advanced Directives: Yes (Not Provided); Patient  does not want information on Advanced Directives; Medical Power of Attorney: Yes - Ardeen Jourdain - niece (Not Provided) Physician Affirmation I have reviewed and agree with the above information. Notes [place] to mental status patient is unable to provide a complete review of systems, social history, family history, and past medical history. Electronic Signature(s) Signed: 05/11/2017 4:32:17 PM By: Curtis Sites Signed: 05/11/2017 5:05:22 PM By: Lenda Kelp PA-C Entered By: Lenda Kelp on 05/11/2017 12:50:46 Hollan, Catarina LMarland Kitchen ([id number]) -------------------------------------------------------------------------------- SuperBill Details Patient Name: Miller, Abbegail L. Date of Service: 05/11/2017 Medical Record Number: [id number] Patient Account Number: [id number] Date of Birth/Sex: Jun 26, 1921 (83 y.o. Female) Treating RN: Curtis Sites Primary Care Provider: Dewaine Oats Other Clinician: Referring Provider: Dewaine Oats Treating Provider/Extender: Linwood Dibbles, HOYT Weeks in Treatment: 5 Diagnosis  Coding ICD-10 Codes Code Description L97.512 Non-pressure chronic ulcer of other part of right foot with fat layer exposed I73.9 Peripheral vascular disease, unspecified I10 Essential (primary) hypertension Facility Procedures CPT4 Code: [id number] Description: 99213 - WOUND CARE VISIT-LEV 3 EST PT Modifier: Quantity: 1 Physician Procedures CPT4 Code: [id number] Description: 99213 - WC PHYS LEVEL 3 - EST PT ICD-10 Diagnosis Description L97.512 Non-pressure chronic ulcer of other part of right foot with fat I73.9 Peripheral vascular disease, unspecified I10 Essential (primary) hypertension Modifier: layer exposed Quantity: 1 Electronic Signature(s) Signed: 05/11/2017 1:07:05 PM By: Curtis Sites Signed: 05/11/2017 5:05:22 PM By: Lenda Kelp PA-C Entered By: Curtis Sites on 05/11/2017 13:07:05

## 2017-05-14 NOTE — Progress Notes (Signed)
[name] ([id number]) Visit Report for 05/11/2017 Arrival Information Details Patient Name: [name], [name] Date of Service: 05/11/2017 11:00 AM Medical Record Number: [id number] Patient Account Number: [id number] Date of Birth/Sex: [birth year] (83 y.o. Female) Treating RN: Curtis Sites Primary Care Jacksen Isip: Dewaine Oats Other Clinician: Referring Elden Brucato: Dewaine Oats Treating Lennon Richins/Extender: Linwood Dibbles, HOYT Weeks in Treatment: 5 Visit Information History Since Last Visit Added or deleted any medications: No Patient Arrived: Wheel Chair Any new allergies or adverse reactions: No Arrival Time: 11:11 Had a fall or experienced change in No Accompanied By: staff activities of daily living that may affect Transfer Assistance: None risk of falls: Patient Identification Verified: Yes Signs or symptoms of abuse/neglect since last visito No Secondary Verification Process Yes Hospitalized since last visit: No Completed: Has Dressing in Place as Prescribed: Yes Patient Has Alerts: Yes Pain Present Now: No Patient Alerts: unable to obtain ABI R/T inaudible pulses Electronic Signature(s) Signed: 05/11/2017 4:32:17 PM By: Curtis Sites Entered By: Curtis Sites on 05/11/2017 11:12:02 Swayne, Imanie L. ([id number]) -------------------------------------------------------------------------------- Clinic Level of Care Assessment Details Patient Name: [name] Date of Service: 05/11/2017 11:00 AM Medical Record Number: [id number] Patient Account Number: [id number] Date of Birth/Sex: Aug 23, 1920 (83 y.o. Female) Treating RN: Curtis Sites Primary Care Anthonee Gelin: TATE, Katherina Right Other Clinician: Referring Ghalia Reicks: TATE, Katherina Right Treating Nastasha Reising/Extender: Linwood Dibbles, HOYT Weeks in Treatment: 5 Clinic Level of Care Assessment Items TOOL 4 Quantity Score []  - Use when only an EandM is performed on FOLLOW-UP visit 0 ASSESSMENTS - Nursing Assessment / Reassessment X - Reassessment of  Co-morbidities (includes updates in patient status) 1 10 X- 1 5 Reassessment of Adherence to Treatment Plan ASSESSMENTS - Wound and Skin Assessment / Reassessment X - Simple Wound Assessment / Reassessment - one wound 1 5 []  - 0 Complex Wound Assessment / Reassessment - multiple wounds []  - 0 Dermatologic / Skin Assessment (not related to wound area) ASSESSMENTS - Focused Assessment []  - Circumferential Edema Measurements - multi extremities 0 []  - 0 Nutritional Assessment / Counseling / Intervention X- 1 5 Lower Extremity Assessment (monofilament, tuning fork, pulses) []  - 0 Peripheral Arterial Disease Assessment (using hand held doppler) ASSESSMENTS - Ostomy and/or Continence Assessment and Care []  - Incontinence Assessment and Management 0 []  - 0 Ostomy Care Assessment and Management (repouching, etc.) PROCESS - Coordination of Care X - Simple Patient / Family Education for ongoing care 1 15 []  - 0 Complex (extensive) Patient / Family Education for ongoing care []  - 0 Staff obtains Chiropractor, Records, Test Results / Process Orders []  - 0 Staff telephones HHA, Nursing Homes / Clarify orders / etc []  - 0 Routine Transfer to another Facility (non-emergent condition) []  - 0 Routine Hospital Admission (non-emergent condition) []  - 0 New Admissions / Manufacturing engineer / Ordering NPWT, Apligraf, etc. []  - 0 Emergency Hospital Admission (emergent condition) X- 1 10 Simple Discharge Coordination Mcneff, Daijanae L. ([id number]) []  - 0 Complex (extensive) Discharge Coordination PROCESS - Special Needs []  - Pediatric / Minor Patient Management 0 []  - 0 Isolation Patient Management []  - 0 Hearing / Language / Visual special needs []  - 0 Assessment of Community assistance (transportation, D/C planning, etc.) []  - 0 Additional assistance / Altered mentation []  - 0 Support Surface(s) Assessment (bed, cushion, seat, etc.) INTERVENTIONS - Wound Cleansing / Measurement X -  Simple Wound Cleansing - one wound 1 5 []  - 0 Complex Wound Cleansing - multiple wounds X- 1 5 Wound Imaging (photographs - any number of  wounds) []  - 0 Wound Tracing (instead of photographs) X- 1 5 Simple Wound Measurement - one wound []  - 0 Complex Wound Measurement - multiple wounds INTERVENTIONS - Wound Dressings X - Small Wound Dressing one or multiple wounds 1 10 []  - 0 Medium Wound Dressing one or multiple wounds []  - 0 Large Wound Dressing one or multiple wounds []  - 0 Application of Medications - topical []  - 0 Application of Medications - injection INTERVENTIONS - Miscellaneous []  - External ear exam 0 []  - 0 Specimen Collection (cultures, biopsies, blood, body fluids, etc.) []  - 0 Specimen(s) / Culture(s) sent or taken to Lab for analysis []  - 0 Patient Transfer (multiple staff / Nurse, adult / Similar devices) []  - 0 Simple Staple / Suture removal (25 or less) []  - 0 Complex Staple / Suture removal (26 or more) []  - 0 Hypo / Hyperglycemic Management (close monitor of Blood Glucose) []  - 0 Ankle / Brachial Index (ABI) - do not check if billed separately X- 1 5 Vital Signs Frappier, Ainslie L. ([id number]) Has the patient been seen at the hospital within the last three years: Yes Total Score: 80 Level Of Care: New/Established - Level 3 Electronic Signature(s) Signed: 05/11/2017 4:32:17 PM By: Curtis Sites Entered By: Curtis Sites on 05/11/2017 13:06:58 [place], Nakaila Elbert Ewings ([id number]) -------------------------------------------------------------------------------- Encounter Discharge Information Details Patient Name: Gleed, Anasofia L. Date of Service: 05/11/2017 11:00 AM Medical Record Number: [id number] Patient Account Number: [id number] Date of Birth/Sex: 29-Oct-1920 (83 y.o. Female) Treating RN: Curtis Sites Primary Care Cleotha Tsang: Dewaine Oats Other Clinician: Referring Leeanna Slaby: Dewaine Oats Treating Shalandria Elsbernd/Extender: Linwood Dibbles, HOYT Weeks in Treatment:  5 Encounter Discharge Information Items Discharge Pain Level: 0 Discharge Condition: Stable Ambulatory Status: Wheelchair Discharge Destination: Home Transportation: Private Auto Accompanied By: staff Schedule Follow-up Appointment: Yes Medication Reconciliation completed and No provided to Patient/Care Giovanni Bath: Provided on Clinical Summary of Care: 05/11/2017 Form Type Recipient Paper Patient [facility] Electronic Signature(s) Signed: 05/14/2017 11:56:58 AM By: Gwenlyn Perking Entered By: Gwenlyn Perking on 05/11/2017 11:50:56 Minion, Jaquesha L. ([id number]) -------------------------------------------------------------------------------- Lower Extremity Assessment Details Patient Name: Lawn, Beaulah L. Date of Service: 05/11/2017 11:00 AM Medical Record Number: [id number] Patient Account Number: [id number] Date of Birth/Sex: August 12, 1920 (83 y.o. Female) Treating RN: Curtis Sites Primary Care Jera Headings: Dewaine Oats Other Clinician: Referring Kalman Nylen: TATE, Katherina Right Treating Lundy Cozart/Extender: STONE III, HOYT Weeks in Treatment: 5 Vascular Assessment Pulses: Dorsalis Pedis Palpable: [Right:No] Doppler Audible: [Right:Inaudible] Posterior Tibial Extremity colors, hair growth, and conditions: Extremity Color: [Right:Hyperpigmented] Hair Growth on Extremity: [Right:No] Temperature of Extremity: [Right:Warm] Capillary Refill: [Right:< 3 seconds] Electronic Signature(s) Signed: 05/11/2017 4:32:17 PM By: Curtis Sites Entered By: Curtis Sites on 05/11/2017 11:20:27 Peine, Zahraa L. ([id number]) -------------------------------------------------------------------------------- Multi Wound Chart Details Patient Name: Halleck, Addeline L. Date of Service: 05/11/2017 11:00 AM Medical Record Number: [id number] Patient Account Number: [id number] Date of Birth/Sex: 11-16-1920 (83 y.o. Female) Treating RN: Curtis Sites Primary Care Katerine Morua: Dewaine Oats Other Clinician: Referring Okley Magnussen: Dewaine Oats Treating Kamrynn Melott/Extender: STONE III, HOYT Weeks in Treatment: 5 Vital Signs Height(in): Pulse(bpm): 47 Weight(lbs): Blood Pressure(mmHg): 153/41 Body Mass Index(BMI): Temperature(F): 98.3 Respiratory Rate 16 (breaths/min): Photos: [1:No Photos] [N/A:N/A] Wound Location: [1:Right Toe Second] [N/A:N/A] Wounding Event: [1:Gradually Appeared] [N/A:N/A] Primary Etiology: [1:Arterial Insufficiency Ulcer] [N/A:N/A] Comorbid History: [1:Hypertension, Gout, Osteoarthritis] [N/A:N/A] Date Acquired: [1:12/26/2016] [N/A:N/A] Weeks of Treatment: [1:5] [N/A:N/A] Wound Status: [1:Open] [N/A:N/A] Pending Amputation on [1:Yes] [N/A:N/A] Presentation: Measurements L x W x D [1:0.4x0.5x0.1] [N/A:N/A] (cm) Area (cm) : [1:0.157] [N/A:N/A] Volume (cm) : [1:0.016] [N/A:N/A] %  Reduction in Area: [1:-67.00%] [N/A:N/A] % Reduction in Volume: [1:-77.80%] [N/A:N/A] Classification: [1:Full Thickness With Exposed Support Structures] [N/A:N/A] Exudate Amount: [1:Medium] [N/A:N/A] Exudate Type: [1:Purulent] [N/A:N/A] Exudate Color: [1:yellow, brown, green] [N/A:N/A] Wound Margin: [1:Flat and Intact] [N/A:N/A] Granulation Amount: [1:Large (67-100%)] [N/A:N/A] Granulation Quality: [1:Pink] [N/A:N/A] Necrotic Amount: [1:Small (1-33%)] [N/A:N/A] Exposed Structures: [1:Fat Layer (Subcutaneous Tissue) Exposed: Yes Bone: Yes Fascia: No Tendon: No Muscle: No Joint: No] [N/A:N/A] Epithelialization: [1:None] [N/A:N/A] Periwound Skin Texture: [1:Excoriation: No Induration: No Callus: No] [N/A:N/A] Crepitus: No Rash: No Scarring: No Periwound Skin Moisture: Maceration: No N/A N/A Dry/Scaly: No Periwound Skin Color: Atrophie Blanche: No N/A N/A Cyanosis: No Ecchymosis: No Erythema: No Hemosiderin Staining: No Mottled: No Pallor: No Rubor: No Temperature: No Abnormality N/A N/A Tenderness on Palpation: Yes N/A N/A Wound Preparation: Ulcer Cleansing: N/A N/A Rinsed/Irrigated with  Saline Topical Anesthetic Applied: Other: lidocaine 4% Treatment Notes Electronic Signature(s) Signed: 05/11/2017 4:32:17 PM By: Curtis Sitesorthy, Joanna Entered By: Curtis Sitesorthy, Joanna on 05/11/2017 11:20:42 Rinck, Candyce ChurnKATIE L. ([id number]) -------------------------------------------------------------------------------- Multi-Disciplinary Care Plan Details Patient Name: Hubner, Breshae L. Date of Service: 05/11/2017 11:00 AM Medical Record Number: [id number] Patient Account Number: [id number] Date of Birth/Sex: Mar 02, 1921 (83 y.o. Female) Treating RN: Curtis Sitesorthy, Joanna Primary Care Torianna Junio: Dewaine OatsATE, DENNY Other Clinician: Referring Kaitlen Redford: Dewaine OatsATE, DENNY Treating Takyia Sindt/Extender: Linwood DibblesSTONE III, HOYT Weeks in Treatment: 5 Active Inactive ` Abuse / Safety / Falls / Self Care Management Nursing Diagnoses: Impaired physical mobility Goals: Patient will remain injury free related to falls Date Initiated: 04/06/2017 Target Resolution Date: 06/22/2017 Goal Status: Active Interventions: Assess fall risk on admission and as needed Notes: ` Orientation to the Wound Care Program Nursing Diagnoses: Knowledge deficit related to the wound healing center program Goals: Patient/caregiver will verbalize understanding of the Wound Healing Center Program Date Initiated: 04/06/2017 Target Resolution Date: 06/22/2017 Goal Status: Active Interventions: Provide education on orientation to the wound center Notes: ` Wound/Skin Impairment Nursing Diagnoses: Impaired tissue integrity Goals: Ulcer/skin breakdown will heal within 14 weeks Date Initiated: 04/06/2017 Target Resolution Date: 06/22/2017 Goal Status: Active Interventions: [name] FleetHARDY, Azalya L. ([id number]) Assess patient/caregiver ability to obtain necessary supplies Assess patient/caregiver ability to perform ulcer/skin care regimen upon admission and as needed Assess ulceration(s) every visit Notes: Electronic Signature(s) Signed: 05/11/2017 4:32:17 PM By: Curtis Sitesorthy,  Joanna Entered By: Curtis Sitesorthy, Joanna on 05/11/2017 11:20:31 Kozicki, Chailyn L. ([id number]) -------------------------------------------------------------------------------- Pain Assessment Details Patient Name: Coble, Vermelle L. Date of Service: 05/11/2017 11:00 AM Medical Record Number: [id number] Patient Account Number: [id number] Date of Birth/Sex: Mar 02, 1921 (83 y.o. Female) Treating RN: Curtis Sitesorthy, Joanna Primary Care Dezirae Service: Dewaine OatsATE, DENNY Other Clinician: Referring Nikie Cid: Dewaine OatsATE, DENNY Treating Slate Debroux/Extender: Linwood DibblesSTONE III, HOYT Weeks in Treatment: 5 Active Problems Location of Pain Severity and Description of Pain Patient Has Paino No Site Locations Pain Management and Medication Current Pain Management: Notes Topical or injectable lidocaine is offered to patient for acute pain when surgical debridement is performed. If needed, Patient is instructed to use over the counter pain medication for the following 24-48 hours after debridement. Wound care MDs do not prescribed pain medications. Patient has chronic pain or uncontrolled pain. Patient has been instructed to make an appointment with their Primary Care Physician for pain management. Electronic Signature(s) Signed: 05/11/2017 4:32:17 PM By: Curtis Sitesorthy, Joanna Entered By: Curtis Sitesorthy, Joanna on 05/11/2017 11:12:11 Bodner, Candyce ChurnKATIE L. ([id number]) -------------------------------------------------------------------------------- Patient/Caregiver Education Details Patient Name: [name] FleetHARDY, Alisyn L. Date of Service: 05/11/2017 11:00 AM Medical Record Number: [id number] Patient Account Number: [id number] Date of Birth/Gender: Mar 02, 1921 (83 y.o. Female) Treating RN: Curtis Sitesorthy, Joanna Primary Care Physician:  TATE, DENNY Other Clinician: Referring Physician: TATE, Katherina Right Treating Physician/Extender: Linwood Dibbles, HOYT Weeks in Treatment: 5 Education Assessment Education Provided To: Caregiver Education Topics Provided Wound/Skin Impairment: Handouts: Other:  wound care as ordered Methods: Demonstration, Explain/Verbal Responses: State content correctly Electronic Signature(s) Signed: 05/11/2017 4:32:17 PM By: Curtis Sites Entered By: Curtis Sites on 05/11/2017 11:40:02 Reichenbach, Asante L. ([id number]) -------------------------------------------------------------------------------- Wound Assessment Details Patient Name: Runco, Adalie L. Date of Service: 05/11/2017 11:00 AM Medical Record Number: [id number] Patient Account Number: [id number] Date of Birth/Sex: Jan 06, 1921 (83 y.o. Female) Treating RN: Curtis Sites Primary Care La Dibella: TATE, Katherina Right Other Clinician: Referring Taishaun Levels: Dewaine Oats Treating Dio Giller/Extender: STONE III, HOYT Weeks in Treatment: 5 Wound Status Wound Number: 1 Primary Etiology: Arterial Insufficiency Ulcer Wound Location: Right Toe Second Wound Status: Open Wounding Event: Gradually Appeared Comorbid History: Hypertension, Gout, Osteoarthritis Date Acquired: 12/26/2016 Weeks Of Treatment: 5 Clustered Wound: No Pending Amputation On Presentation Photos Photo Uploaded By: Curtis Sites on 05/11/2017 13:36:42 Wound Measurements Length: (cm) 0.4 Width: (cm) 0.5 Depth: (cm) 0.1 Area: (cm) 0.157 Volume: (cm) 0.016 % Reduction in Area: -67% % Reduction in Volume: -77.8% Epithelialization: None Tunneling: No Undermining: No Wound Description Full Thickness With Exposed Support Classification: Structures Wound Margin: Flat and Intact Exudate Medium Amount: Exudate Type: Purulent Exudate Color: yellow, brown, green Foul Odor After Cleansing: No Slough/Fibrino Yes Wound Bed Granulation Amount: Large (67-100%) Exposed Structure Granulation Quality: Pink Fascia Exposed: No Necrotic Amount: Small (1-33%) Fat Layer (Subcutaneous Tissue) Exposed: Yes Necrotic Quality: Adherent Slough Tendon Exposed: No Muscle Exposed: No Joint Exposed: No Molina, Jakeline L. ([id number]) Bone Exposed:  Yes Periwound Skin Texture Texture Color No Abnormalities Noted: No No Abnormalities Noted: No Callus: No Atrophie Blanche: No Crepitus: No Cyanosis: No Excoriation: No Ecchymosis: No Induration: No Erythema: No Rash: No Hemosiderin Staining: No Scarring: No Mottled: No Pallor: No Moisture Rubor: No No Abnormalities Noted: No Dry / Scaly: No Temperature / Pain Maceration: No Temperature: No Abnormality Tenderness on Palpation: Yes Wound Preparation Ulcer Cleansing: Rinsed/Irrigated with Saline Topical Anesthetic Applied: Other: lidocaine 4%, Treatment Notes Wound #1 (Right Toe Second) 1. Cleansed with: Clean wound with Normal Saline 2. Anesthetic Topical Lidocaine 4% cream to wound bed prior to debridement 4. Dressing Applied: Prisma Ag Notes coverlet Electronic Signature(s) Signed: 05/11/2017 4:32:17 PM By: Curtis Sites Entered By: Curtis Sites on 05/11/2017 11:19:59 Langdon, Reshanda L. ([id number]) -------------------------------------------------------------------------------- Vitals Details Patient Name: Krzywicki, Samaia L. Date of Service: 05/11/2017 11:00 AM Medical Record Number: [id number] Patient Account Number: [id number] Date of Birth/Sex: 05/17/1921 (83 y.o. Female) Treating RN: Curtis Sites Primary Care Consuello Lassalle: TATE, Katherina Right Other Clinician: Referring Sixto Bowdish: TATE, Katherina Right Treating Bashar Milam/Extender: STONE III, HOYT Weeks in Treatment: 5 Vital Signs Time Taken: 11:12 Temperature (F): 98.3 Pulse (bpm): 47 Respiratory Rate (breaths/min): 16 Blood Pressure (mmHg): 153/41 Reference Range: 80 - 120 mg / dl Electronic Signature(s) Signed: 05/11/2017 4:32:17 PM By: Curtis Sites Entered By: Curtis Sites on 05/11/2017 11:14:05

## 2017-05-25 ENCOUNTER — Encounter: Payer: Medicare Other | Attending: Surgery | Admitting: Surgery

## 2017-05-25 DIAGNOSIS — I10 Essential (primary) hypertension: Secondary | ICD-10-CM | POA: Diagnosis not present

## 2017-05-25 DIAGNOSIS — L97512 Non-pressure chronic ulcer of other part of right foot with fat layer exposed: Secondary | ICD-10-CM | POA: Diagnosis not present

## 2017-05-25 DIAGNOSIS — I739 Peripheral vascular disease, unspecified: Secondary | ICD-10-CM | POA: Diagnosis not present

## 2017-05-27 NOTE — Progress Notes (Signed)
[name] [name], [name] L. ([id number]) Visit Report for 05/25/2017 Chief Complaint Document Details Patient Name: [name] [name] ChurnKATIE L. Date of Service: 05/25/2017 11:00 AM Medical Record Number: [id number] Patient Account Number: [id number] Date of Birth/Sex: 12-18-1920 (83 y.o. Female) Treating RN: [name] [name] Primary Care Provider: Dewaine OatsATE, DENNY Other Clinician: Referring Provider: Dewaine OatsATE, DENNY Treating Provider/Extender: [name] [name] Weeks in Treatment: 7 Information Obtained from: Patient Chief Complaint Patient seen for complaints of Non-Healing Wound to the [name] second toe Electronic Signature(s) Signed: 05/25/2017 11:29:30 AM By: [name] KannerBritto, Takoda Janowiak MD, FACS Entered By: [name] KannerBritto, Quinto Tippy on 05/25/2017 11:29:30 [name], [name] L. ([id number]) -------------------------------------------------------------------------------- HPI Details Patient Name: [name], [name] Date of Service: 05/25/2017 11:00 AM Medical Record Number: [id number] Patient Account Number: [id number] Date of Birth/Sex: 12-18-1920 (83 y.o. Female) Treating RN: [name] [name] Primary Care Provider: Dewaine OatsATE, DENNY Other Clinician: Referring Provider: Dewaine OatsATE, DENNY Treating Provider/Extender: [name] ReBritto, Senan Urey Weeks in Treatment: 7 History of Present Illness Location: [name] second toe Quality: Patient reports No Pain. Severity: Patient states wound are getting worse. Duration: Patient has had the wound for > 3 months prior to seeking treatment at the wound center Context: The wound would happen gradually Modifying Factors: Other treatment(s) tried include:asked her to wash the foot, soak it in Epsom salt and apply some Bactroban and offload it. HPI Description: very pleasant 83 year old patient, no history of diabetes or smoking has had a ulceration on the [name] second toe dorsum for about 3 months. Besides essential hypertension and gout she does not have any other significant problems and has never been a smoker. 04/13/17 on evaluation today patient  appears to be doing about the same in regard to her [name] second toe ulcer. We did get the results for x-ray which showed osteopenia without any specific bone abnormality osteomyelitis. She does continue to have discomfort with palpation of the wound although secondary to mental status is unable to rate or describe this pain. No fevers, chills, nausea, or vomiting noted at this time. 04/27/2017 -- over the last couple of weeks the patient has had some issues with a urinary tract infection and some mental status changes and after review today, I have recommended we do not do the arterial studies recommended by my colleague last week, in view of the fact that the patient's wound has improved considerably and I do not want to put her through further investigations, which may not change the treatment plan. 05/04/2017 -- the patient was seen by her podiatrist and also has a CT scan pending to workup her recent mental status changes. 05/13/17 on evaluation today patient appears to be doing about the same. The wound is not significantly smaller although there is more granulation than when I last saw this. We are continuing to manage this more conservatively as patient's family does not want to delve deeper into a lot of testing they would prefer to see how the wound progresses. Fortunately there is no evidence of infection. No fevers, chills, nausea, or vomiting noted at this time. She has been tolerating the dressing changes without complication and has only minimal discomfort that she is unable to rate or describe her pain secondary to mental status. Electronic Signature(s) Signed: 05/25/2017 11:29:42 AM By: [name] KannerBritto, Genell Thede MD, FACS Entered By: [name] KannerBritto, Nguyen Todorov on 05/25/2017 11:29:41 [name], [name] ChurnKATIE L. ([id number]) -------------------------------------------------------------------------------- Physical Exam Details Patient Name: [name], [name] Date of Service: 05/25/2017 11:00 AM Medical Record Number:  [id number] Patient Account Number: [id number] Date of Birth/Sex: 12-18-1920 (83 y.o. Female) Treating RN: [name] [name] Primary Care Provider: TATE, [name]  Other Clinician: Referring Provider: TATE, Katherina [name] Treating Provider/Extender: [name] Re in Treatment: 7 Constitutional . Pulse regular. Respirations normal and unlabored. Afebrile. . Eyes Nonicteric. Reactive to light. Ears, Nose, Mouth, and Throat Lips, teeth, and gums WNL.Marland Kitchen Moist mucosa without lesions. Neck supple and nontender. No palpable supraclavicular or cervical adenopathy. Normal sized without goiter. Respiratory WNL. No retractions.. Cardiovascular Pedal Pulses WNL. No clubbing, cyanosis or edema. Lymphatic No adneopathy. No adenopathy. No adenopathy. Musculoskeletal Adexa without tenderness or enlargement.. Digits and nails w/o clubbing, cyanosis, infection, petechiae, ischemia, or inflammatory conditions.. Integumentary (Hair, Skin) No suspicious lesions. No crepitus or fluctuance. No peri-wound warmth or erythema. No masses.Marland Kitchen Psychiatric Judgement and insight Intact.. No evidence of depression, anxiety, or agitation.. Notes the patient continues to have healthy granulation tissue and there is no cellulitis and I have not probed down to bone Electronic Signature(s) Signed: 05/25/2017 11:30:25 AM By: [name] Kanner MD, FACS Entered By: [name] Kanner on 05/25/2017 11:30:24 [name] [name] ([id number]) -------------------------------------------------------------------------------- Physician Orders Details Patient Name: [name], [name] L. Date of Service: 05/25/2017 11:00 AM Medical Record Number: [id number] Patient Account Number: [id number] Date of Birth/Sex: [birth year] (83 y.o. Female) Treating RN: [name] Sites Primary Care Provider: Dewaine Oats Other Clinician: Referring Provider: Dewaine Oats Treating Provider/Extender: [name] Re in Treatment: 7 Verbal / Phone Orders: No Diagnosis Coding Wound  Cleansing Wound #1 [name] Toe Second o Clean wound with Normal Saline. Anesthetic Wound #1 [name] Toe Second o Topical Lidocaine 4% cream applied to wound bed prior to debridement Primary Wound Dressing Wound #1 [name] Toe Second o Prisma Ag Secondary Dressing Wound #1 [name] Toe Second o Gauze and Kerlix/Conform - or bandaid or coverlet Dressing Change Frequency Wound #1 [name] Toe Second o Other: - change the bandage on shower days or if soiled Follow-up Appointments Wound #1 [name] Toe Second o Other: - 3 weeks Off-Loading Wound #1 [name] Toe Second o Other: - continue wearing shoes that do not rub the top of the toe Additional Orders / Instructions Wound #1 [name] Toe Second o Increase protein intake. o Other: - Please add vitamin A, vitamin C and zinc supplements to your diet Electronic Signature(s) Signed: 05/25/2017 3:39:10 PM By: [name] Kanner MD, FACS Signed: 05/25/2017 4:50:52 PM By: [name] Sites Entered By: [name] Sites on 05/25/2017 11:26:13 Crear, [name] L. ([id number]) -------------------------------------------------------------------------------- Problem List Details Patient Name: Radoncic, [name] L. Date of Service: 05/25/2017 11:00 AM Medical Record Number: [id number] Patient Account Number: [id number] Date of Birth/Sex: 03/25/1921 (83 y.o. 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 ([id number]) -------------------------------------------------------------------------------- Progress Note Details Patient Name: Senters, [name] L. Date of Service: 05/25/2017 11:00 AM Medical Record Number: [id number] Patient Account Number: [id number] Date of Birth/Sex: [birth year] (83 y.o. Female) Treating RN: [name] Sites Primary Care Provider: Dewaine Oats Other Clinician: Referring Provider: Dewaine Oats Treating Provider/Extender: [name] Re in Treatment: 7 Subjective Chief Complaint Information obtained from Patient Patient seen for complaints of Non-Healing Wound to the [name] second toe History of Present Illness (HPI) The following HPI elements were documented for the  patient's wound: Location: [name] second toe Quality: Patient reports No Pain. Severity: Patient states wound are getting worse. Duration: Patient has had the wound for > 3 months prior to seeking treatment at the wound center Context: The wound would happen gradually Modifying Factors: Other treatment(s) tried include:asked her to wash the foot, soak it in Epsom salt and apply some Bactroban and offload it. very pleasant 83 year old patient, no history of diabetes or smoking has had a ulceration on the [name] second toe dorsum for about 3 months. Besides essential hypertension and gout she does not have any other significant problems and has never been a smoker. 04/13/17 on evaluation today patient appears to be doing about the same in regard to her [name] second toe ulcer. We did get the results for x-ray which showed osteopenia without any specific bone abnormality osteomyelitis. She does continue to have discomfort with palpation of the wound although secondary to mental status is unable to rate or describe this pain. No fevers, chills, nausea, or vomiting noted at this time. 04/27/2017 -- over the last couple of weeks the patient has had some issues with a urinary tract  infection and some mental status changes and after review today, I have recommended we do not do the arterial studies recommended by my colleague last week, in view of the fact that the patient's wound has improved considerably and I do not want to put her through further investigations, which may not change the treatment plan. 05/04/2017 -- the patient was seen by her podiatrist and also has a CT scan pending to workup her recent mental status changes. 05/13/17 on evaluation today patient appears to be doing about the same. The wound is not significantly smaller although there is more granulation than when I last saw this. We are continuing to manage this more conservatively as patient's family does not want to delve deeper into a lot of testing they would prefer to see how the wound progresses. Fortunately there is no evidence of infection. No fevers, chills, nausea, or vomiting noted at this time. She has been tolerating the dressing changes without complication and has only minimal discomfort that she is unable to rate or describe her pain secondary to mental status. Patient History Information obtained from Patient. Social History Never smoker, Marital Status - Widowed, Alcohol Use - Never, Drug Use - No History, Caffeine Use - Daily. Consuegra, [name] L. ([id number]) Objective Constitutional Pulse regular. Respirations normal and unlabored. Afebrile. Vitals Time Taken: 11:12 AM, Temperature: 98.2 F, Pulse: 48 bpm, Respiratory Rate: 16 breaths/min, Blood Pressure: 125/45 mmHg. Eyes Nonicteric. Reactive to light. Ears, Nose, Mouth, and Throat Lips, teeth, and gums WNL.Marland Kitchen Moist mucosa without lesions. Neck supple and nontender. No palpable supraclavicular or cervical adenopathy. Normal sized without goiter. Respiratory WNL. No retractions.. Cardiovascular Pedal Pulses WNL. No clubbing, cyanosis or edema. Lymphatic No adneopathy. No adenopathy. No adenopathy. Musculoskeletal Adexa  without tenderness or enlargement.. Digits and nails w/o clubbing, cyanosis, infection, petechiae, ischemia, or inflammatory conditions.Marland Kitchen Psychiatric Judgement and insight Intact.. No evidence of depression, anxiety, or agitation.. General Notes: the patient continues to have healthy granulation tissue and there is no cellulitis and I have not probed down to bone Integumentary (Hair, Skin) No suspicious lesions. No crepitus or fluctuance. No peri-wound warmth or erythema. No masses.. Wound #1 status is Open. Original cause of wound was Gradually Appeared. The wound is located on the [name] Toe Second. The wound measures 0.4cm length x 0.5cm width x 0.3cm depth; 0.157cm^2 area and 0.047cm^3  volume. There is bone and Fat Layer (Subcutaneous Tissue) Exposed exposed. There is no tunneling or undermining noted. There is a medium amount of purulent drainage noted. The wound margin is flat and intact. There is large (67-100%) pink granulation within the wound bed. There is a small (1-33%) amount of necrotic tissue within the wound bed including Adherent Slough. The periwound skin appearance did not exhibit: Callus, Crepitus, Excoriation, Induration, Rash, Scarring, Dry/Scaly, Maceration, Atrophie Blanche, Cyanosis, Ecchymosis, Hemosiderin Staining, Mottled, Pallor, Rubor, Erythema. Periwound temperature was noted as No Abnormality. The periwound has tenderness on palpation. 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 Anesthetic: Wound #1 [name] Toe Second: Topical Lidocaine 4% cream applied to wound bed prior to debridement Primary Wound Dressing: Wound #1 [name] Toe Second: Prisma Ag Secondary Dressing: Wound #1 [name] Toe  Second: Gauze and Kerlix/Conform - or bandaid or coverlet Dressing Change Frequency: Wound #1 [name] Toe Second: Other: - change the bandage on shower days or if soiled Follow-up Appointments: Wound #1 [name] Toe Second: Other: - 3 weeks Off-Loading: Wound #1 [name] Toe Second: Other: - continue wearing shoes that do not rub the top of the toe Additional Orders / Instructions: Wound #1 [name] Toe Second: Increase protein intake. Other: - Please add vitamin A, vitamin C and zinc supplements to your diet After scuffing with her caregiver and review today I have recommended: 1. Prisma AG with a bordered foam to be applied every other day 2. Offloading is been discussed in great detail 3. Adequate protein, vitamin A, vitamin C and zinc 4. We have discussed with her caregiver that at this stage I'm I'm going to see her back only for palliative care and she can come once in every 2-3 weeks. [name] [name], [name] [name] ([id number]) Electronic Signature(s) Signed: 05/25/2017 11:31:25 AM By: [name] Kanner MD, FACS Entered By: [name] Kanner on 05/25/2017 11:31:24 Deguzman, [name] [name] ([id number]) -------------------------------------------------------------------------------- ROS/PFSH Details Patient Name: Linch, [name] L. Date of Service: 05/25/2017 11:00 AM Medical Record Number: [id number] Patient Account Number: [id number] Date of Birth/Sex: [birth year] (83 y.o. Female) Treating RN: [name] Sites Primary Care Provider: TATE, Katherina [name] Other Clinician: Referring Provider: Dewaine Oats Treating Provider/Extender: [name] Re in Treatment: 7 Information Obtained From Patient Wound History Do you currently have one or more open woundso Yes How many open wounds do you currently haveo 1 Approximately how long have you had your woundso 3 months How have you been treating your wound(s) until nowo neosporin Has your wound(s) ever healed and then re-openedo No Have you had any lab work done in the past montho  No Have you tested positive for an antibiotic resistant organism (MRSA, VRE)o No Have you tested positive for osteomyelitis (bone infection)o No Have you had any tests for circulation on your legso No Cardiovascular Medical History: Positive for: Hypertension Musculoskeletal Medical History: Positive for: Gout; Osteoarthritis Immunizations Pneumococcal Vaccine: Received Pneumococcal Vaccination: Yes Immunization Notes: up to date Implantable Devices Family and Social History Never smoker; Marital Status - Widowed; Alcohol Use: Never; Drug Use: No History; Caffeine Use: Daily; Financial Concerns: No; Food, Clothing or Shelter Needs: No; Support System Lacking: No; Transportation Concerns: No; Advanced Directives: Yes (Not Provided); Patient does not want information on Advanced Directives; Medical Power of Attorney: Yes - Ardeen Jourdain - niece (Not  Provided) Physician Affirmation I have reviewed and agree with the above information. Electronic Signature(s) Signed: 05/25/2017 3:39:10 PM By: [name] KannerBritto, Blanch Stang MD, FACS Signed: 05/25/2017 4:50:52 PM By: [name] [name] Entered By: [name] KannerBritto, Terri Rorrer on 05/25/2017 11:29:52 Chatmon, [name] Elbert EwingsL. ([id number]) -------------------------------------------------------------------------------- SuperBill Details Patient Name: Jowett, [name] L. Date of Service: 05/25/2017 Medical Record Number: [id number] Patient Account Number: [id number] Date of Birth/Sex: Jul 30, 1920 [age] y.o. 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 I73.9 Peripheral vascular disease, unspecified I10 Essential (primary) hypertension Facility Procedures CPT4 Code: [id number] Description: 99213 - WOUND CARE VISIT-LEV 3 EST  PT Modifier: Quantity: 1 Physician Procedures CPT4 Code: [id number] Description: 99213 - WC PHYS LEVEL 3 - EST PT ICD-10 Diagnosis Description L97.512 Non-pressure chronic ulcer of other part of [name] foot with fat I73.9 Peripheral vascular disease, unspecified I10 Essential (primary) hypertension Modifier: layer exposed Quantity: 1 Electronic Signature(s) Signed: 05/25/2017 11:42:44 AM By: [name] [name] Signed: 05/25/2017 3:39:10 PM By: [name] KannerBritto, Liann Spaeth MD, FACS Previous Signature: 05/25/2017 11:31:41 AM Version By: [name] KannerBritto, Louise Rawson MD, FACS Entered By: [name] [name] on 05/25/2017 11:42:44

## 2017-05-27 NOTE — Progress Notes (Signed)
[name], [name] ([id number]) Visit Report for 05/25/2017 Arrival Information Details Patient Name: [name], [name] Date of Service: 05/25/2017 11:00 AM Medical Record Number: [id number] Patient Account Number: [id number] Date of Birth/Sex: [birth year] (83 y.o. Female) Treating RN: Curtis [name], [name] Primary Care [name]: [name] Other Clinician: Referring [name]: [name] Treating Jewelianna Pancoast/Extender: [name] [name] in Treatment: 7 Visit Information History Since Last Visit Added or deleted any medications: No Patient Arrived: Wheel Chair Any new allergies or adverse reactions: No Arrival Time: 11:05 Had a fall or experienced change in No Accompanied By: staff activities of daily living that may affect Transfer Assistance: None risk of falls: Patient Identification Verified: Yes Signs or symptoms of abuse/neglect since last visito No Secondary Verification Process Yes Hospitalized since last visit: No Completed: Has Dressing in Place as Prescribed: Yes Patient Has Alerts: Yes Pain Present Now: No Patient Alerts: unable to obtain ABI R/T inaudible pulses Electronic Signature(s) Signed: 05/25/2017 4:50:52 PM By: Curtis [name], [name] Entered By: Curtis [name], [name] on 05/25/2017 11:10:25 [name], [name] ([id number]) -------------------------------------------------------------------------------- Clinic Level of Care Assessment Details Patient Name: [name], [name] Date of Service: 05/25/2017 11:00 AM Medical Record Number: [id number] Patient Account Number: [id number] Date of Birth/Sex: [birth year] (83 y.o. Female) Treating RN: Curtis [name], [name] Primary Care Gustaf Mccarter: TATE, [name] Other Clinician: Referring [name]: [name] Treating [name]/Extender: [name] [name] in Treatment: 7 Clinic Level of Care Assessment Items TOOL 4 Quantity Score []  - Use when only an EandM is performed on FOLLOW-UP visit 0 ASSESSMENTS - Nursing Assessment / Reassessment X - Reassessment of  Co-morbidities (includes updates in patient status) 1 10 X- 1 5 Reassessment of Adherence to Treatment Plan ASSESSMENTS - Wound and Skin Assessment / Reassessment X - Simple Wound Assessment / Reassessment - one wound 1 5 []  - 0 Complex Wound Assessment / Reassessment - multiple wounds []  - 0 Dermatologic / Skin Assessment (not related to wound area) ASSESSMENTS - Focused Assessment []  - Circumferential Edema Measurements - multi extremities 0 []  - 0 Nutritional Assessment / Counseling / Intervention X- 1 5 Lower Extremity Assessment (monofilament, tuning fork, pulses) X- 1 10 Peripheral Arterial Disease Assessment (using hand held doppler) ASSESSMENTS - Ostomy and/or Continence Assessment and Care []  - Incontinence Assessment and Management 0 []  - 0 Ostomy Care Assessment and Management (repouching, etc.) PROCESS - Coordination of Care X - Simple Patient / Family Education for ongoing care 1 15 []  - 0 Complex (extensive) Patient / Family Education for ongoing care []  - 0 Staff obtains ChiropractorConsents, Records, Test Results / Process Orders []  - 0 Staff telephones HHA, Nursing Homes / Clarify orders / etc []  - 0 Routine Transfer to another Facility (non-emergent condition) []  - 0 Routine Hospital Admission (non-emergent condition) []  - 0 New Admissions / Manufacturing engineernsurance Authorizations / Ordering NPWT, Apligraf, etc. []  - 0 Emergency Hospital Admission (emergent condition) X- 1 10 Simple Discharge Coordination Adderly, [name] ([id number]) []  - 0 Complex (extensive) Discharge Coordination PROCESS - Special Needs []  - Pediatric / Minor Patient Management 0 []  - 0 Isolation Patient Management []  - 0 Hearing / Language / Visual special needs []  - 0 Assessment of Community assistance (transportation, D/C planning, etc.) []  - 0 Additional assistance / Altered mentation []  - 0 Support Surface(s) Assessment (bed, cushion, seat, etc.) INTERVENTIONS - Wound Cleansing / Measurement X -  Simple Wound Cleansing - one wound 1 5 []  - 0 Complex Wound Cleansing - multiple wounds X- 1 5 Wound Imaging (photographs - any number of wounds) []  -  0 Wound Tracing (instead of photographs) X- 1 5 Simple Wound Measurement - one wound []  - 0 Complex Wound Measurement - multiple wounds INTERVENTIONS - Wound Dressings X - Small Wound Dressing one or multiple wounds 1 10 []  - 0 Medium Wound Dressing one or multiple wounds []  - 0 Large Wound Dressing one or multiple wounds []  - 0 Application of Medications - topical []  - 0 Application of Medications - injection INTERVENTIONS - Miscellaneous []  - External ear exam 0 []  - 0 Specimen Collection (cultures, biopsies, blood, body fluids, etc.) []  - 0 Specimen(s) / Culture(s) sent or taken to Lab for analysis []  - 0 Patient Transfer (multiple staff / Nurse, adultHoyer Lift / Similar devices) []  - 0 Simple Staple / Suture removal (25 or less) []  - 0 Complex Staple / Suture removal (26 or more) []  - 0 Hypo / Hyperglycemic Management (close monitor of Blood Glucose) []  - 0 Ankle / Brachial Index (ABI) - do not check if billed separately X- 1 5 Vital Signs [name], [name] ([id number]) Has the patient been seen at the hospital within the last three years: Yes Total Score: 90 Level Of Care: New/Established - Level 3 Electronic Signature(s) Signed: 05/25/2017 4:50:52 PM By: Curtis [name], [name] Entered By: Curtis [name], [name] on 05/25/2017 11:42:34 [name], [name] Elbert EwingsL. ([id number]) -------------------------------------------------------------------------------- Encounter Discharge Information Details Patient Name: [name], [name] Date of Service: 05/25/2017 11:00 AM Medical Record Number: [id number] Patient Account Number: [id number] Date of Birth/Sex: 22-Jan-1921 (83 y.o. Female) Treating RN: Curtis [name], [name] Primary Care Rashema Seawright: [name] Other Clinician: Referring Elia Keenum: [name] Treating Teondra Newburg/Extender: [name] [name] in Treatment:  7 Encounter Discharge Information Items Discharge Pain Level: 0 Discharge Condition: Stable Ambulatory Status: Wheelchair Discharge Destination: Home Transportation: Private Auto Accompanied By: staff Schedule Follow-up Appointment: Yes Medication Reconciliation completed and No provided to Patient/Care Nolan Tuazon: Provided on Clinical Summary of Care: 05/25/2017 Form Type [facility]ip[facility] Paper [facility] Signature(s) Signed: 05/25/2017 11:43:33 AM By: Curtis [name], [name] Entered By: Curtis [name], [name] on 05/25/2017 11:43:32 [name], [name] ([id number]) -------------------------------------------------------------------------------- Lower Extremity Assessment Details Patient Name: [name], [name] Date of Service: 05/25/2017 11:00 AM Medical Record Number: [id number] Patient Account Number: [id number] Date of Birth/Sex: 22-Jan-1921 (83 y.o. Female) Treating RN: Curtis [name], [name] Primary Care Britanny Marksberry: [name] Other Clinician: Referring Zylpha Poynor: [name] Treating Nyna Chilton/Extender: [name] [name] in Treatment: 7 Vascular Assessment Pulses: Dorsalis Pedis Palpable: [Right:No] Doppler Audible: [Right:Inaudible] Posterior Tibial Extremity colors, hair growth, and conditions: Extremity Color: [Right:Hyperpigmented] Hair Growth on Extremity: [Right:No] Temperature of Extremity: [Right:Warm] Capillary Refill: [Right:< 3 seconds] Electronic Signature(s) Signed: 05/25/2017 4:50:52 PM By: Curtis [name], [name] Entered By: Curtis [name], [name] on 05/25/2017 11:17:54 [name], [name] ([id number]) -------------------------------------------------------------------------------- Multi Wound Chart Details Patient Name: Paynter, Nnenna L. Date of Service: 05/25/2017 11:00 AM Medical Record Number: [id number] Patient Account Number: [id number] Date of Birth/Sex: 22-Jan-1921 (83 y.o. Female) Treating RN: Curtis [name], [name] Primary Care Kiandre Spagnolo: [name] Other Clinician: Referring Heather Mckendree: [name] OatsATE,  [name] Treating Naylee Frankowski/Extender: [name] [name] in Treatment: 7 Vital Signs Height(in): Pulse(bpm): 48 Weight(lbs): Blood Pressure(mmHg): 125/45 Body Mass Index(BMI): Temperature(F): 98.2 Respiratory Rate 16 (breaths/min): Photos: [1:No Photos] [N/A:N/A] Wound Location: [1:Right Toe Second] [N/A:N/A] Wounding Event: [1:Gradually Appeared] [N/A:N/A] Primary Etiology: [1:Arterial Insufficiency Ulcer] [N/A:N/A] Comorbid History: [1:Hypertension, Gout, Osteoarthritis] [N/A:N/A] Date Acquired: [1:12/26/2016] [N/A:N/A] [name] of Treatment: [1:7] [N/A:N/A] Wound Status: [1:Open] [N/A:N/A] Pending Amputation on [1:Yes] [N/A:N/A] Presentation: Measurements L x W x D [1:0.4x0.5x0.3] [N/A:N/A] (cm) Area (cm) : [1:0.157] [N/A:N/A] Volume (cm) : [1:0.047] [N/A:N/A] % Reduction in Area: [1:-67.00%] [N/A:N/A] %  Reduction in Volume: [1:-422.20%] [N/A:N/A] Classification: [1:Full Thickness With Exposed Support Structures] [N/A:N/A] Exudate Amount: [1:Medium] [N/A:N/A] Exudate Type: [1:Purulent] [N/A:N/A] Exudate Color: [1:yellow, brown, green] [N/A:N/A] Wound Margin: [1:Flat and Intact] [N/A:N/A] Granulation Amount: [1:Large (67-100%)] [N/A:N/A] Granulation Quality: [1:Pink] [N/A:N/A] Necrotic Amount: [1:Small (1-33%)] [N/A:N/A] Exposed Structures: [1:Fat Layer (Subcutaneous Tissue) Exposed: Yes Bone: Yes Fascia: No Tendon: No Muscle: No Joint: No] [N/A:N/A] Epithelialization: [1:None] [N/A:N/A] Periwound Skin Texture: [1:Excoriation: No Induration: No Callus: No] [N/A:N/A] Crepitus: No Rash: No Scarring: No Periwound Skin Moisture: Maceration: No N/A N/A Dry/Scaly: No Periwound Skin Color: Atrophie Blanche: No N/A N/A Cyanosis: No Ecchymosis: No Erythema: No Hemosiderin Staining: No Mottled: No Pallor: No Rubor: No Temperature: No Abnormality N/A N/A Tenderness on Palpation: Yes N/A N/A Wound Preparation: Ulcer Cleansing: N/A N/A Rinsed/Irrigated with  Saline Topical Anesthetic Applied: Other: lidocaine 4% Treatment Notes Electronic Signature(s) Signed: 05/25/2017 11:29:23 AM By: Evlyn Kanner MD, FACS Entered By: Evlyn Kanner on 05/25/2017 11:29:23 [name], [name] ([id number]) -------------------------------------------------------------------------------- Multi-Disciplinary Care Plan Details Patient Name: [name], [name] Date of Service: 05/25/2017 11:00 AM Medical Record Number: [id number] Patient Account Number: [id number] Date of Birth/Sex: 07-06-1921 (83 y.o. Female) Treating RN: Curtis Sites Primary Care Micah Barnier: [name] Oats Other Clinician: Referring Jurnie Garritano: [name] Oats Treating Idabelle Mcpeters/Extender: [name] Re in Treatment: 7 Active Inactive ` Abuse / Safety / Falls / Self Care Management Nursing Diagnoses: Impaired physical mobility Goals: Patient will remain injury free related to falls Date Initiated: 04/06/2017 Target Resolution Date: 06/22/2017 Goal Status: Active Interventions: Assess fall risk on admission and as needed Notes: ` Orientation to the Wound Care Program Nursing Diagnoses: Knowledge deficit related to the wound healing center program Goals: Patient/caregiver will verbalize understanding of the Wound Healing Center Program Date Initiated: 04/06/2017 Target Resolution Date: 06/22/2017 Goal Status: Active Interventions: Provide education on orientation to the wound center Notes: ` Wound/Skin Impairment Nursing Diagnoses: Impaired tissue integrity Goals: Ulcer/skin breakdown will heal within 14 [name] Date Initiated: 04/06/2017 Target Resolution Date: 06/22/2017 Goal Status: Active Interventions: MAHIMA, HOTTLE ([id number]) Assess patient/caregiver ability to obtain necessary supplies Assess patient/caregiver ability to perform ulcer/skin care regimen upon admission and as needed Assess ulceration(s) every visit Notes: Electronic Signature(s) Signed: 05/25/2017 4:50:52 PM By:  Curtis Sites Entered By: Curtis Sites on 05/25/2017 11:18:20 [name], [name] ([id number]) -------------------------------------------------------------------------------- Pain Assessment Details Patient Name: Heitzenrater, Zykeria L. Date of Service: 05/25/2017 11:00 AM Medical Record Number: [id number] Patient Account Number: [id number] Date of Birth/Sex: 10-13-1920 (83 y.o. Female) Treating RN: Curtis Sites Primary Care Shila Kruczek: [name] Oats Other Clinician: Referring Hadyn Blanck: [name] Oats Treating Zak Gondek/Extender: [name] Re in Treatment: 7 Active Problems Location of Pain Severity and Description of Pain Patient Has Paino No Site Locations Character of Pain Describe the Pain: Other: sore at times Pain Management and Medication Current Pain Management: Notes Topical or injectable lidocaine is offered to patient for acute pain when surgical debridement is performed. If needed, Patient is instructed to use over the counter pain medication for the following 24-48 hours after debridement. Wound care MDs do not prescribed pain medications. Patient has chronic pain or uncontrolled pain. Patient has been instructed to make an appointment with their Primary Care Physician for pain management. Electronic Signature(s) Signed: 05/25/2017 4:50:52 PM By: Curtis Sites Entered By: Curtis Sites on 05/25/2017 11:10:56 [place], [name] ([id number]) -------------------------------------------------------------------------------- Patient/Caregiver Education Details Patient Name: [name] Fleet. Date of Service: 05/25/2017 11:00 AM Medical Record Number: [id number] Patient Account Number: [id number] Date of Birth/Gender: 04/09/1921 (83 y.o. Female) Treating RN: Dorthy,  Mardene Celeste Primary Care Physician: [name] Oats Other Clinician: Referring Physician: Dewaine Oats Treating Physician/Extender: [name] Re in Treatment: 7 Education Assessment Education Provided To: Caregiver Education  Topics Provided Wound/Skin Impairment: Handouts: Other: wound care as ordered Methods: Demonstration, Explain/Verbal Responses: State content correctly Electronic Signature(s) Signed: 05/25/2017 4:50:52 PM By: Curtis Sites Entered By: Curtis Sites on 05/25/2017 11:43:52 Kohan, Arabelle L. ([id number]) -------------------------------------------------------------------------------- Wound Assessment Details Patient Name: Radford, Akayla L. Date of Service: 05/25/2017 11:00 AM Medical Record Number: [id number] Patient Account Number: [id number] Date of Birth/Sex: Jan 28, 1921 (83 y.o. Female) Treating RN: Curtis Sites Primary Care Charina Fons: [name] Oats Other Clinician: Referring Lessie Funderburke: [name] Oats Treating Rockney Grenz/Extender: [name] Re in Treatment: 7 Wound Status Wound Number: 1 Primary Etiology: Arterial Insufficiency Ulcer Wound Location: Right Toe Second Wound Status: Open Wounding Event: Gradually Appeared Comorbid History: Hypertension, Gout, Osteoarthritis Date Acquired: 12/26/2016 [name] Of Treatment: 7 Clustered Wound: No Pending Amputation On Presentation Photos Photo Uploaded By: Curtis Sites on 05/25/2017 11:40:31 Wound Measurements Length: (cm) 0.4 Width: (cm) 0.5 Depth: (cm) 0.3 Area: (cm) 0.157 Volume: (cm) 0.047 % Reduction in Area: -67% % Reduction in Volume: -422.2% Epithelialization: None Tunneling: No Undermining: No Wound Description Full Thickness With Exposed Support Classification: Structures Wound Margin: Flat and Intact Exudate Medium Amount: Exudate Type: Purulent Exudate Color: yellow, brown, green Foul Odor After Cleansing: No Slough/Fibrino Yes Wound Bed Granulation Amount: Large (67-100%) Exposed Structure Granulation Quality: Pink Fascia Exposed: No Necrotic Amount: Small (1-33%) Fat Layer (Subcutaneous Tissue) Exposed: Yes Necrotic Quality: Adherent Slough Tendon Exposed: No Muscle Exposed: No Joint Exposed:  No Heims, Valora L. ([id number]) Bone Exposed: Yes Periwound Skin Texture Texture Color No Abnormalities Noted: No No Abnormalities Noted: No Callus: No Atrophie Blanche: No Crepitus: No Cyanosis: No Excoriation: No Ecchymosis: No Induration: No Erythema: No Rash: No Hemosiderin Staining: No Scarring: No Mottled: No Pallor: No Moisture Rubor: No No Abnormalities Noted: No Dry / Scaly: No Temperature / Pain Maceration: No Temperature: No Abnormality Tenderness on Palpation: Yes Wound Preparation Ulcer Cleansing: Rinsed/Irrigated with Saline Topical Anesthetic Applied: Other: lidocaine 4%, Treatment Notes Wound #1 (Right Toe Second) 1. Cleansed with: Clean wound with Normal Saline 2. Anesthetic Topical Lidocaine 4% cream to wound bed prior to debridement 4. Dressing Applied: Prisma Ag Other dressing (specify in notes) Notes coverlet Electronic Signature(s) Signed: 05/25/2017 4:50:52 PM By: Curtis Sites Entered By: Curtis Sites on 05/25/2017 11:17:19 Emile, Kavita L. ([id number]) -------------------------------------------------------------------------------- Vitals Details Patient Name: Hladik, Yatzil L. Date of Service: 05/25/2017 11:00 AM Medical Record Number: [id number] Patient Account Number: [id number] Date of Birth/Sex: July 14, 1921 (83 y.o. Female) Treating RN: Curtis Sites Primary Care Jaziel Bennett: TATE, [name] Right Other Clinician: Referring Galaxy Borden: [name] Oats Treating Azaylea Maves/Extender: [name] Re in Treatment: 7 Vital Signs Time Taken: 11:12 Temperature (F): 98.2 Pulse (bpm): 48 Respiratory Rate (breaths/min): 16 Blood Pressure (mmHg): 125/45 Reference Range: 80 - 120 mg / dl Electronic Signature(s) Signed: 05/25/2017 4:50:52 PM By: Curtis Sites Entered By: Curtis Sites on 05/25/2017 11:16:40

## 2017-06-15 ENCOUNTER — Encounter: Payer: Medicare Other | Admitting: Surgery

## 2017-06-15 DIAGNOSIS — L97512 Non-pressure chronic ulcer of other part of right foot with fat layer exposed: Secondary | ICD-10-CM | POA: Diagnosis not present

## 2017-06-17 NOTE — Progress Notes (Signed)
[name], [name] ([id number]) Visit Report for 06/15/2017 Arrival Information Details Patient Name: [name], [name] Date of Service: 06/15/2017 11:00 AM Medical Record Number: [id number] Patient Account Number: [id number] Date of Birth/Sex: 1920/12/04 (83 y.o. Female) Treating RN: Curtis Sitesorthy, Joanna Primary Care Latham Kinzler: Dewaine OatsATE, DENNY Other Clinician: Referring Joyous Gleghorn: Dewaine OatsATE, DENNY Treating Isamu Trammel/Extender: Rudene ReBritto, Errol Weeks in Treatment: 10 Visit Information History Since Last Visit Added or deleted [name] medications: No Patient Arrived: Ambulatory [name] new allergies or adverse reactions: No Arrival Time: 11:03 Had a fall or experienced change in No Accompanied By: staff activities of daily living that may affect Transfer Assistance: None risk of falls: Patient Identification Verified: Yes Signs or symptoms of abuse/neglect since last visito No Secondary Verification Process Yes Hospitalized since last visit: No Completed: Has Dressing in Place as Prescribed: Yes Patient Has Alerts: Yes Pain Present Now: No Patient Alerts: unable to obtain ABI R/T inaudible pulses Electronic Signature(s) Signed: 06/15/2017 4:46:33 PM By: Curtis Sitesorthy, Joanna Entered By: Curtis Sitesorthy, Joanna on 06/15/2017 11:04:13 [name], [name] ([id number]) -------------------------------------------------------------------------------- Encounter Discharge Information Details Patient Name: [name], [name] Date of Service: 06/15/2017 11:00 AM Medical Record Number: [id number] Patient Account Number: [id number] Date of Birth/Sex: 1920/12/04 (83 y.o. Female) Treating RN: Curtis Sitesorthy, Joanna Primary Care Fia Hebert: Dewaine OatsATE, DENNY Other Clinician: Referring Seymone Forlenza: Dewaine OatsATE, DENNY Treating Tomeka Kantner/Extender: Rudene ReBritto, Errol Weeks in Treatment: 10 Encounter Discharge Information Items Discharge Pain Level: 0 Discharge Condition: Stable Ambulatory Status: Ambulatory Discharge Destination: Home Private Transportation: Auto Accompanied  By: caregiver Schedule Follow-up Appointment: Yes Medication Reconciliation completed and provided No to Patient/Care Orvis Stann: Clinical Summary of Care: Electronic Signature(s) Signed: 06/15/2017 11:57:30 AM By: Curtis Sitesorthy, Joanna Entered By: Curtis Sitesorthy, Joanna on 06/15/2017 11:57:29 [name], [name] ([id number]) -------------------------------------------------------------------------------- Lower Extremity Assessment Details Patient Name: Betterton, [name] Date of Service: 06/15/2017 11:00 AM Medical Record Number: [id number] Patient Account Number: [id number] Date of Birth/Sex: 1920/12/04 (83 y.o. Female) Treating RN: Curtis Sitesorthy, Joanna Primary Care Drey Shaff: Dewaine OatsATE, DENNY Other Clinician: Referring Kourtney Montesinos: Dewaine OatsATE, DENNY Treating Sherre Wooton/Extender: Rudene ReBritto, Errol Weeks in Treatment: 10 Vascular Assessment Pulses: Dorsalis Pedis Palpable: [Left:Yes] Posterior Tibial Extremity colors, hair growth, and conditions: Extremity Color: [Left:Hyperpigmented] Hair Growth on Extremity: [Left:No] Temperature of Extremity: [Left:Warm] Capillary Refill: [Left:< 3 seconds] Electronic Signature(s) Signed: 06/15/2017 4:46:33 PM By: Curtis Sitesorthy, Joanna Entered By: Curtis Sitesorthy, Joanna on 06/15/2017 11:11:36 [name], [name] ([id number]) -------------------------------------------------------------------------------- Multi Wound Chart Details Patient Name: [name], [name] Date of Service: 06/15/2017 11:00 AM Medical Record Number: [id number] Patient Account Number: [id number] Date of Birth/Sex: 1920/12/04 (83 y.o. Female) Treating RN: Curtis Sitesorthy, Joanna Primary Care Kayani Rapaport: TATE, Katherina RightENNY Other Clinician: Referring Kris Burd: Dewaine OatsATE, DENNY Treating Avayah Raffety/Extender: Rudene ReBritto, Errol Weeks in Treatment: 10 Vital Signs Height(in): Pulse(bpm): 53 Weight(lbs): Blood Pressure(mmHg): 162/44 Body Mass Index(BMI): Temperature(F): 97.8 Respiratory Rate 16 (breaths/min): Photos: [1:No Photos] [N/A:N/A] Wound Location: [1:Right  Toe Second] [N/A:N/A] Wounding Event: [1:Gradually Appeared] [N/A:N/A] Primary Etiology: [1:Arterial Insufficiency Ulcer] [N/A:N/A] Comorbid History: [1:Hypertension, Gout, Osteoarthritis] [N/A:N/A] Date Acquired: [1:12/26/2016] [N/A:N/A] Weeks of Treatment: [1:10] [N/A:N/A] Wound Status: [1:Open] [N/A:N/A] Pending Amputation on [1:Yes] [N/A:N/A] Presentation: Measurements L x W x D [1:0.1x0.1x0.2] [N/A:N/A] (cm) Area (cm) : [1:0.008] [N/A:N/A] Volume (cm) : [1:0.002] [N/A:N/A] % Reduction in Area: [1:91.50%] [N/A:N/A] % Reduction in Volume: [1:77.80%] [N/A:N/A] Classification: [1:Full Thickness With Exposed Support Structures] [N/A:N/A] Exudate Amount: [1:Medium] [N/A:N/A] Exudate Type: [1:Purulent] [N/A:N/A] Exudate Color: [1:yellow, brown, green] [N/A:N/A] Wound Margin: [1:Flat and Intact] [N/A:N/A] Granulation Amount: [1:Large (67-100%)] [N/A:N/A] Granulation Quality: [1:Pink] [N/A:N/A] Necrotic Amount: [1:Small (1-33%)] [N/A:N/A] Exposed Structures: [1:Fat Layer (Subcutaneous Tissue) Exposed: Yes Bone: Yes Fascia:  No Tendon: No Muscle: No Joint: No] [N/A:N/A] Epithelialization: [1:None] [N/A:N/A] Debridement: [1:Debridement (11042-11047)] [N/A:N/A] Pre-procedure [1:11:21] [N/A:N/A] Verification/Time Out Taken: [name] ([id number]) Pain Control: Lidocaine 4% Topical Solution N/A N/A Tissue Debrided: Fibrin/Slough, Skin, N/A N/A Subcutaneous Level: Skin/Subcutaneous Tissue N/A N/A Debridement Area (sq cm): 0.01 N/A N/A Instrument: Forceps N/A N/A Bleeding: None N/A N/A Procedural Pain: 0 N/A N/A Post Procedural Pain: 0 N/A N/A Debridement Treatment Procedure was tolerated well N/A N/A Response: Post Debridement 0.4x0.5x0.2 N/A N/A Measurements L x W x D (cm) Post Debridement Volume: 0.031 N/A N/A (cm) Periwound Skin Texture: Excoriation: No N/A N/A Induration: No Callus: No Crepitus: No Rash: No Scarring: No Periwound Skin Moisture: Maceration: No  N/A N/A Dry/Scaly: No Periwound Skin Color: Atrophie Blanche: No N/A N/A Cyanosis: No Ecchymosis: No Erythema: No Hemosiderin Staining: No Mottled: No Pallor: No Rubor: No Temperature: No Abnormality N/A N/A Tenderness on Palpation: Yes N/A N/A Wound Preparation: Ulcer Cleansing: N/A N/A Rinsed/Irrigated with Saline Topical Anesthetic Applied: Other: lidocaine 4% Procedures Performed: Debridement N/A N/A Treatment Notes Electronic Signature(s) Signed: 06/15/2017 11:27:53 AM By: Evlyn Kanner MD, FACS Entered By: Evlyn Kanner on 06/15/2017 11:27:53 [name], [name] ([id number]) -------------------------------------------------------------------------------- Multi-Disciplinary Care Plan Details Patient Name: [name], [name] Date of Service: 06/15/2017 11:00 AM Medical Record Number: [id number] Patient Account Number: [id number] Date of Birth/Sex: 11-Sep-1920 (83 y.o. Female) Treating RN: Curtis Sites Primary Care Joetta Delprado: Dewaine Oats Other Clinician: Referring Dayleen Beske: Dewaine Oats Treating Kemiyah Tarazon/Extender: Rudene Re in Treatment: 10 Active Inactive ` Abuse / Safety / Falls / Self Care Management Nursing Diagnoses: Impaired physical mobility Goals: Patient will remain injury free related to falls Date Initiated: 04/06/2017 Target Resolution Date: 06/22/2017 Goal Status: Active Interventions: Assess fall risk on admission and as needed Notes: ` Orientation to the Wound Care Program Nursing Diagnoses: Knowledge deficit related to the wound healing center program Goals: Patient/caregiver will verbalize understanding of the Wound Healing Center Program Date Initiated: 04/06/2017 Target Resolution Date: 06/22/2017 Goal Status: Active Interventions: Provide education on orientation to the wound center Notes: ` Wound/Skin Impairment Nursing Diagnoses: Impaired tissue integrity Goals: Ulcer/skin breakdown will heal within 14 weeks Date Initiated:  04/06/2017 Target Resolution Date: 06/22/2017 Goal Status: Active Interventions: PETRONA, WYETH ([id number]) Assess patient/caregiver ability to obtain necessary supplies Assess patient/caregiver ability to perform ulcer/skin care regimen upon admission and as needed Assess ulceration(s) every visit Notes: Electronic Signature(s) Signed: 06/15/2017 4:46:33 PM By: Curtis Sites Entered By: Curtis Sites on 06/15/2017 11:23:07 [name], [name] ([id number]) -------------------------------------------------------------------------------- Pain Assessment Details Patient Name: Igou, Reneta L. Date of Service: 06/15/2017 11:00 AM Medical Record Number: [id number] Patient Account Number: [id number] Date of Birth/Sex: February 23, 1921 (83 y.o. Female) Treating RN: Curtis Sites Primary Care Kyira Volkert: Dewaine Oats Other Clinician: Referring Hosea Hanawalt: Dewaine Oats Treating Stormee Duda/Extender: Rudene Re in Treatment: 10 Active Problems Location of Pain Severity and Description of Pain Patient Has Paino No Site Locations Pain Management and Medication Current Pain Management: Electronic Signature(s) Signed: 06/15/2017 4:46:33 PM By: Curtis Sites Entered By: Curtis Sites on 06/15/2017 11:05:16 [name], [name] ([id number]) -------------------------------------------------------------------------------- Patient/Caregiver Education Details Patient Name: [name] Bayley L. Date of Service: 06/15/2017 11:00 AM Medical Record Number: [id number] Patient Account Number: [id number] Date of Birth/Gender: [birth year] (83 y.o. Female) Treating RN: Curtis Sites Primary Care Physician: Dewaine Oats Other Clinician: Referring Physician: Dewaine Oats Treating Physician/Extender: Rudene Re in Treatment: 10 Education Assessment Education Provided To: Caregiver Education Topics Provided Wound/Skin Impairment: Handouts: Other: reportable s/s Methods: Explain/Verbal Responses: State content  correctly Electronic Signature(s) Signed: 06/15/2017 4:46:33 PM By: Curtis Sitesorthy, Joanna Entered By: Curtis Sitesorthy, Joanna on 06/15/2017 11:57:47 [name], [name] Kitchen. ([id number]) -------------------------------------------------------------------------------- Wound Assessment Details Patient Name: [name], [name] Date of Service: 06/15/2017 11:00 AM Medical Record Number: [id number] Patient Account Number: [id number] Date of Birth/Sex: 12-06-1920 (83 y.o. Female) Treating RN: Curtis Sitesorthy, Joanna Primary Care Leanza Shepperson: Dewaine OatsATE, DENNY Other Clinician: Referring August Gosser: Dewaine OatsATE, DENNY Treating Lacrecia Delval/Extender: Rudene ReBritto, Errol Weeks in Treatment: 10 Wound Status Wound Number: 1 Primary Etiology: Arterial Insufficiency Ulcer Wound Location: Right Toe Second Wound Status: Open Wounding Event: Gradually Appeared Comorbid History: Hypertension, Gout, Osteoarthritis Date Acquired: 12/26/2016 Weeks Of Treatment: 10 Clustered Wound: No Pending Amputation On Presentation Photos Photo Uploaded By: Elliot GurneyWoody, BSN, RN, CWS, Kim on 06/15/2017 13:01:21 Wound Measurements Length: (cm) 0.1 Width: (cm) 0.1 Depth: (cm) 0.2 Area: (cm) 0.008 Volume: (cm) 0.002 % Reduction in Area: 91.5% % Reduction in Volume: 77.8% Epithelialization: None Tunneling: No Undermining: No Wound Description Full Thickness With Exposed Support Foul Odor Classification: Structures Slough/Fi Wound Margin: Flat and Intact Exudate Medium Amount: Exudate Type: Purulent Exudate Color: yellow, brown, green After Cleansing: No brino Yes Wound Bed Granulation Amount: Large (67-100%) Exposed Structure Granulation Quality: Pink Fascia Exposed: No Necrotic Amount: Small (1-33%) Fat Layer (Subcutaneous Tissue) Exposed: Yes Necrotic Quality: Adherent Slough Tendon Exposed: No Muscle Exposed: No Joint Exposed: No Jenkinson, Yomaris L. ([id number]) Bone Exposed: Yes Periwound Skin Texture Texture Color No Abnormalities Noted: No No Abnormalities  Noted: No Callus: No Atrophie Blanche: No Crepitus: No Cyanosis: No Excoriation: No Ecchymosis: No Induration: No Erythema: No Rash: No Hemosiderin Staining: No Scarring: No Mottled: No Pallor: No Moisture Rubor: No No Abnormalities Noted: No Dry / Scaly: No Temperature / Pain Maceration: No Temperature: No Abnormality Tenderness on Palpation: Yes Wound Preparation Ulcer Cleansing: Rinsed/Irrigated with Saline Topical Anesthetic Applied: Other: lidocaine 4%, Treatment Notes Wound #1 (Right Toe Second) 1. Cleansed with: Clean wound with Normal Saline 2. Anesthetic Topical Lidocaine 4% cream to wound bed prior to debridement 4. Dressing Applied: Other dressing (specify in notes) 5. Secondary Dressing Applied Dry Gauze Kerlix/Conform 7. Secured with Tape Notes silvercel Electronic Signature(s) Signed: 06/15/2017 4:46:33 PM By: Curtis Sitesorthy, Joanna Entered By: Curtis Sitesorthy, Joanna on 06/15/2017 11:10:45 Schaffer, Tryniti L. ([id number]) -------------------------------------------------------------------------------- Vitals Details Patient Name: Kerner, Shanasia L. Date of Service: 06/15/2017 11:00 AM Medical Record Number: [id number] Patient Account Number: [id number] Date of Birth/Sex: 12-06-1920 (83 y.o. Female) Treating RN: Curtis Sitesorthy, Joanna Primary Care Zylen Wenig: TATE, Katherina RightENNY Other Clinician: Referring Pride Gonzales: Dewaine OatsATE, DENNY Treating Rishon Thilges/Extender: Rudene ReBritto, Errol Weeks in Treatment: 10 Vital Signs Time Taken: 11:05 Temperature (F): 97.8 Pulse (bpm): 53 Respiratory Rate (breaths/min): 16 Blood Pressure (mmHg): 162/44 Reference Range: 80 - 120 mg / dl Electronic Signature(s) Signed: 06/15/2017 4:46:33 PM By: Curtis Sitesorthy, Joanna Entered By: Curtis Sitesorthy, Joanna on 06/15/2017 11:06:33

## 2017-06-17 NOTE — Progress Notes (Signed)
[name] ([id number]) Visit Report for 06/15/2017 Chief Complaint Document Details Patient Name: [name], [name] Date of Service: 06/15/2017 11:00 AM Medical Record Number: [id number] Patient Account Number: [id number] Date of Birth/Sex: 03-Aug-1920 (83 y.o. Female) Treating RN: Curtis Sites Primary Care Provider: Dewaine Oats Other Clinician: Referring Provider: Dewaine Oats Treating Provider/Extender: Rudene Re in Treatment: 10 Information Obtained from: Patient Chief Complaint Patient seen for complaints of Non-Healing Wound to the right second toe Electronic Signature(s) Signed: 06/15/2017 11:28:40 AM By: Evlyn Kanner MD, FACS Entered By: Evlyn Kanner on 06/15/2017 11:28:40 [name] ([id number]) -------------------------------------------------------------------------------- Debridement Details Patient Name: [name], [name] Date of Service: 06/15/2017 11:00 AM Medical Record Number: [id number] Patient Account Number: [id number] Date of Birth/Sex: [birth year] (83 y.o. Female) Treating RN: Curtis Sites Primary Care Provider: Dewaine Oats Other Clinician: Referring Provider: Dewaine Oats Treating Provider/Extender: Rudene Re in Treatment: 10 Debridement Performed for Wound #1 Right Toe Second Assessment: Performed By: Physician Evlyn Kanner, MD Debridement: Debridement Severity of Tissue Pre Fat layer exposed Debridement: Pre-procedure Verification/Time Yes - 11:21 Out Taken: Start Time: 11:21 Pain Control: Lidocaine 4% Topical Solution Level: Skin/Subcutaneous Tissue Total Area Debrided ([name] x W): 0.1 (cm) x 0.1 (cm) = 0.01 (cm) Tissue and other material Viable, Non-Viable, Fibrin/Slough, Skin, Subcutaneous debrided: Instrument: Forceps Bleeding: None End Time: 11:23 Procedural Pain: 0 Post Procedural Pain: 0 Response to Treatment: Procedure was tolerated well Post Debridement Measurements of Total Wound Length: (cm) 0.4 Width:  (cm) 0.5 Depth: (cm) 0.2 Volume: (cm) 0.031 Character of Wound/Ulcer Post Debridement: Improved Severity of Tissue Post Debridement: Necrosis of bone Post Procedure Diagnosis Same as Pre-procedure Notes the wound easily probes down to bone. no evidence of cellulitis or purulent drainage Electronic Signature(s) Signed: 06/15/2017 11:28:33 AM By: Evlyn Kanner MD, FACS Signed: 06/15/2017 4:46:33 PM By: Curtis Sites Entered By: Evlyn Kanner on 06/15/2017 11:28:33 [name] ([id number]) -------------------------------------------------------------------------------- HPI Details Patient Name: [name], [name] Date of Service: 06/15/2017 11:00 AM Medical Record Number: [id number] Patient Account Number: [id number] Date of Birth/Sex: 03-04-1921 (83 y.o. Female) Treating RN: Curtis Sites Primary Care Provider: Dewaine Oats Other Clinician: Referring Provider: Dewaine Oats Treating Provider/Extender: Rudene Re in Treatment: 10 History of Present Illness Location: right second toe Quality: Patient reports No Pain. Severity: Patient states wound are getting worse. Duration: Patient has had the wound for > 3 months prior to seeking treatment at the wound center Context: The wound would happen gradually Modifying Factors: Other treatment(s) tried include:asked her to wash the foot, soak it in Epsom salt and apply some Bactroban and offload it. HPI Description: very pleasant 83 year old patient, no history of diabetes or smoking has had a ulceration on the right second toe dorsum for about 3 months. Besides essential hypertension and gout she does not have any other significant problems and has never been a smoker. 04/13/17 on evaluation today patient appears to be doing about the same in regard to her right second toe ulcer. We did get the results for x-ray which showed osteopenia without any specific bone abnormality osteomyelitis. She does continue to have discomfort with  palpation of the wound although secondary to mental status is unable to rate or describe this pain. No fevers, chills, nausea, or vomiting noted at this time. 04/27/2017 -- over the last couple of weeks the patient has had some issues with a urinary tract infection and some mental status changes and after review today, I have recommended we do not do the arterial studies recommended by  my colleague last week, in view of the fact that the patient's wound has improved considerably and I do not want to put her through further investigations, which may not change the treatment plan. 05/04/2017 -- the patient was seen by her podiatrist and also has a CT scan pending to workup her recent mental status changes. 05/13/17 on evaluation today patient appears to be doing about the same. The wound is not significantly smaller although there is more granulation than when I last saw this. We are continuing to manage this more conservatively as patient's family does not want to delve deeper into a lot of testing they would prefer to see how the wound progresses. Fortunately there is no evidence of infection. No fevers, chills, nausea, or vomiting noted at this time. She has been tolerating the dressing changes without complication and has only minimal discomfort that she is unable to rate or describe her pain secondary to mental status. 06/15/2017 -- 83 year old patient with significant dementia is being very well taken care of by her caregivers but the big plan is for palliative care only and no aggressive treatment is to be done. Electronic Signature(s) Signed: 06/15/2017 11:29:13 AM By: Evlyn KannerBritto, Ladarren Steiner MD, FACS Entered By: Evlyn KannerBritto, Tyquez [name] on 06/15/2017 11:29:13 [name] ([id number]) -------------------------------------------------------------------------------- Physical Exam Details Patient Name: [name], [name] Date of Service: 06/15/2017 11:00 AM Medical Record Number: [id number] Patient Account  Number: [id number] Date of Birth/Sex: 1921-01-29 (83 y.o. Female) Treating RN: Curtis Sitesorthy, Joanna Primary Care Provider: Dewaine OatsATE, DENNY Other Clinician: Referring Provider: Dewaine OatsATE, DENNY Treating Provider/Extender: Rudene ReBritto, Chez Bulnes Weeks in Treatment: 10 Constitutional . Pulse regular. Respirations normal and unlabored. Afebrile. . Eyes Nonicteric. Reactive to light. Ears, Nose, Mouth, and Throat Lips, teeth, and gums WNL.Marland Kitchen. Moist mucosa without lesions. Neck supple and nontender. No palpable supraclavicular or cervical adenopathy. Normal sized without goiter. Respiratory WNL. No retractions.. Cardiovascular Pedal Pulses WNL. No clubbing, cyanosis or edema. Lymphatic No adneopathy. No adenopathy. No adenopathy. Musculoskeletal Adexa without tenderness or enlargement.. Digits and nails w/o clubbing, cyanosis, infection, petechiae, ischemia, or inflammatory conditions.. Integumentary (Hair, Skin) No suspicious lesions. No crepitus or fluctuance. No peri-wound warmth or erythema. No masses.Marland Kitchen. Psychiatric Judgement and insight Intact.. No evidence of depression, anxiety, or agitation.. Notes eschar and subcutaneous debridement was done with a toothed forcep and this easily probes down to bone. There is no cellulitis of the was no purulent drainage. Electronic Signature(s) Signed: 06/15/2017 11:29:52 AM By: Evlyn KannerBritto, Silvia Markuson MD, FACS Entered By: Evlyn KannerBritto, Leenah Seidner on 06/15/2017 11:29:51 Caples, [name]. ([id number]) -------------------------------------------------------------------------------- Physician Orders Details Patient Name: [name], [name] Date of Service: 06/15/2017 11:00 AM Medical Record Number: [id number] Patient Account Number: [id number] Date of Birth/Sex: 1921-01-29 (83 y.o. Female) Treating RN: Curtis Sitesorthy, Joanna Primary Care Provider: Dewaine OatsATE, DENNY Other Clinician: Referring Provider: Dewaine OatsATE, DENNY Treating Provider/Extender: Rudene ReBritto, Zaila Crew Weeks in Treatment: 10 Verbal / Phone Orders:  No Diagnosis Coding Wound Cleansing Wound #1 Right Toe Second o Clean wound with Normal Saline. Anesthetic Wound #1 Right Toe Second o Topical Lidocaine 4% cream applied to wound bed prior to debridement Primary Wound Dressing Wound #1 Right Toe Second o Silvercel Non-Adherent Secondary Dressing Wound #1 Right Toe Second o Gauze and Kerlix/Conform - or bandaid or coverlet Dressing Change Frequency Wound #1 Right Toe Second o Other: - change the bandage on shower days or if soiled Follow-up Appointments Wound #1 Right Toe Second o Other: - 3 weeks Off-Loading Wound #1 Right Toe Second o Other: - continue wearing shoes that do not rub the  top of the toe Additional Orders / Instructions Wound #1 Right Toe Second o Increase protein intake. o Other: - Please add vitamin A, vitamin C and zinc supplements to your diet Electronic Signature(s) Signed: 06/15/2017 4:42:19 PM By: Evlyn Kanner MD, FACS Signed: 06/15/2017 4:46:33 PM By: Curtis Sites Entered By: Curtis Sites on 06/15/2017 11:26:48 Heidemann, Jametta [name]. ([id number]) -------------------------------------------------------------------------------- Problem List Details Patient Name: [name], [name] Date of Service: 06/15/2017 11:00 AM Medical Record Number: [id number] Patient Account Number: [id number] Date of Birth/Sex: 1920-10-26 (83 y.o. Female) Treating RN: Curtis Sites Primary Care Provider: Dewaine Oats Other Clinician: Referring Provider: Dewaine Oats Treating Provider/Extender: Rudene Re in Treatment: 10 Active Problems ICD-10 Encounter Code Description Active Date Diagnosis L97.512 Non-pressure chronic ulcer of other part of right foot with fat layer 04/06/2017 Yes exposed I73.9 Peripheral vascular disease, unspecified 04/06/2017 Yes I10 Essential (primary) hypertension 04/06/2017 Yes Inactive Problems Resolved Problems Electronic Signature(s) Signed: 06/15/2017 11:27:48 AM By:  Evlyn Kanner MD, FACS Entered By: Evlyn Kanner on 06/15/2017 11:27:48 Lozon, Margarett [name]. ([id number]) -------------------------------------------------------------------------------- Progress Note Details Patient Name: [name], [name] Date of Service: 06/15/2017 11:00 AM Medical Record Number: [id number] Patient Account Number: [id number] Date of Birth/Sex: [birth year] (83 y.o. Female) Treating RN: Curtis Sites Primary Care Provider: Dewaine Oats Other Clinician: Referring Provider: Dewaine Oats Treating Provider/Extender: Rudene Re in Treatment: 10 Subjective Chief Complaint Information obtained from Patient Patient seen for complaints of Non-Healing Wound to the right second toe History of Present Illness (HPI) The following HPI elements were documented for the patient's wound: Location: right second toe Quality: Patient reports No Pain. Severity: Patient states wound are getting worse. Duration: Patient has had the wound for > 3 months prior to seeking treatment at the wound center Context: The wound would happen gradually Modifying Factors: Other treatment(s) tried include:asked her to wash the foot, soak it in Epsom salt and apply some Bactroban and offload it. very pleasant 83 year old patient, no history of diabetes or smoking has had a ulceration on the right second toe dorsum for about 3 months. Besides essential hypertension and gout she does not have any other significant problems and has never been a smoker. 04/13/17 on evaluation today patient appears to be doing about the same in regard to her right second toe ulcer. We did get the results for x-ray which showed osteopenia without any specific bone abnormality osteomyelitis. She does continue to have discomfort with palpation of the wound although secondary to mental status is unable to rate or describe this pain. No fevers, chills, nausea, or vomiting noted at this time. 04/27/2017 -- over the last couple of weeks  the patient has had some issues with a urinary tract infection and some mental status changes and after review today, I have recommended we do not do the arterial studies recommended by my colleague last week, in view of the fact that the patient's wound has improved considerably and I do not want to put her through further investigations, which may not change the treatment plan. 05/04/2017 -- the patient was seen by her podiatrist and also has a CT scan pending to workup her recent mental status changes. 05/13/17 on evaluation today patient appears to be doing about the same. The wound is not significantly smaller although there is more granulation than when I last saw this. We are continuing to manage this more conservatively as patient's family does not want to delve deeper into a lot of testing they would prefer to see how the wound progresses.  Fortunately there is no evidence of infection. No fevers, chills, nausea, or vomiting noted at this time. She has been tolerating the dressing changes without complication and has only minimal discomfort that she is unable to rate or describe her pain secondary to mental status. 06/15/2017 -- 83 year old patient with significant dementia is being very well taken care of by her caregivers but the big plan is for palliative care only and no aggressive treatment is to be done. Patient History Information obtained from Patient. Social History [name], [name] ([id number]) Never smoker, Marital Status - Widowed, Alcohol Use - Never, Drug Use - No History, Caffeine Use - Daily. Objective Constitutional Pulse regular. Respirations normal and unlabored. Afebrile. Vitals Time Taken: 11:05 AM, Temperature: 97.8 F, Pulse: 53 bpm, Respiratory Rate: 16 breaths/min, Blood Pressure: 162/44 mmHg. Eyes Nonicteric. Reactive to light. Ears, Nose, Mouth, and Throat Lips, teeth, and gums WNL.Marland Kitchen. Moist mucosa without lesions. Neck supple and nontender. No palpable  supraclavicular or cervical adenopathy. Normal sized without goiter. Respiratory WNL. No retractions.. Cardiovascular Pedal Pulses WNL. No clubbing, cyanosis or edema. Lymphatic No adneopathy. No adenopathy. No adenopathy. Musculoskeletal Adexa without tenderness or enlargement.. Digits and nails w/o clubbing, cyanosis, infection, petechiae, ischemia, or inflammatory conditions.Marland Kitchen. Psychiatric Judgement and insight Intact.. No evidence of depression, anxiety, or agitation.. General Notes: eschar and subcutaneous debridement was done with a toothed forcep and this easily probes down to bone. There is no cellulitis of the was no purulent drainage. Integumentary (Hair, Skin) No suspicious lesions. No crepitus or fluctuance. No peri-wound warmth or erythema. No masses.. Wound #1 status is Open. Original cause of wound was Gradually Appeared. The wound is located on the Right Toe Second. The wound measures 0.1cm length x 0.1cm width x 0.2cm depth; 0.008cm^2 area and 0.002cm^3 volume. There is bone and Fat Layer (Subcutaneous Tissue) Exposed exposed. There is no tunneling or undermining noted. There is a medium amount of purulent drainage noted. The wound margin is flat and intact. There is large (67-100%) pink granulation within the wound bed. There is a small (1-33%) amount of necrotic tissue within the wound bed including Adherent Slough. The periwound skin appearance did not exhibit: Callus, Crepitus, Excoriation, Induration, Rash, Scarring, Dry/Scaly, Maceration, Atrophie Havlicek, Aella [name]. ([id number]) Blanche, Cyanosis, Ecchymosis, Hemosiderin Staining, Mottled, Pallor, Rubor, Erythema. Periwound temperature was noted as No Abnormality. The periwound has tenderness on palpation. Assessment Active Problems ICD-10 L97.512 - Non-pressure chronic ulcer of other part of right foot with fat layer exposed I73.9 - Peripheral vascular disease, unspecified I10 - Essential (primary)  hypertension Procedures Wound #1 Pre-procedure diagnosis of Wound #1 is an Arterial Insufficiency Ulcer located on the Right Toe Second .Severity of Tissue Pre Debridement is: Fat layer exposed. There was a Skin/Subcutaneous Tissue Debridement ([id number]) debridement with total area of 0.01 sq cm performed by Evlyn KannerBritto, Rondell Frick, MD. with the following instrument(s): Forceps to remove Viable and Non-Viable tissue/material including Fibrin/Slough, Skin, and Subcutaneous after achieving pain control using Lidocaine 4% Topical Solution. A time out was conducted at 11:21, prior to the start of the procedure. There was no bleeding. The procedure was tolerated well with a pain level of 0 throughout and a pain level of 0 following the procedure. Post Debridement Measurements: 0.4cm length x 0.5cm width x 0.2cm depth; 0.031cm^3 volume. Character of Wound/Ulcer Post Debridement is improved. Severity of Tissue Post Debridement is: Necrosis of bone. Post procedure Diagnosis Wound #1: Same as Pre-Procedure General Notes: the wound easily probes down to bone. no evidence of cellulitis or  purulent drainage. Plan Wound Cleansing: Wound #1 Right Toe Second: Clean wound with Normal Saline. Anesthetic: Wound #1 Right Toe Second: Topical Lidocaine 4% cream applied to wound bed prior to debridement Primary Wound Dressing: Wound #1 Right Toe Second: Silvercel Non-Adherent Secondary Dressing: Wound #1 Right Toe Second: Gauze and Kerlix/Conform - or bandaid or coverlet Dressing Change Frequency: Wound #1 Right Toe Second: Other: - change the bandage on shower days or if soiled Follow-up Appointments: Wound #1 Right Toe Second: Other: - 3 weeks [name], [name] ([id number]) Off-Loading: Wound #1 Right Toe Second: Other: - continue wearing shoes that do not rub the top of the toe Additional Orders / Instructions: Wound #1 Right Toe Second: Increase protein intake. Other: - Please add vitamin A, vitamin C and  zinc supplements to your diet this 83 year old patient is for palliative care and as per the decision of the patient's family we will continue with supporting her with local silver alginate dressing changes with a bordered foam to be done every other day. No aggressive therapy has been planned and if she does develop cellulitis I have instructed her caregiver to take her to the ER for appropriate amputation of the right second toe. If all goes well, she will be seen back in about 4 weeks' time Electronic Signature(s) Signed: 06/15/2017 11:31:09 AM By: Evlyn Kanner MD, FACS Entered By: Evlyn Kanner on 06/15/2017 11:31:09 [name], [name] ([id number]) -------------------------------------------------------------------------------- ROS/PFSH Details Patient Name: [name], [name] Date of Service: 06/15/2017 11:00 AM Medical Record Number: [id number] Patient Account Number: [id number] Date of Birth/Sex: Nov 02, 1920 (83 y.o. Female) Treating RN: Curtis Sites Primary Care Provider: TATE, Katherina Right Other Clinician: Referring Provider: Dewaine Oats Treating Provider/Extender: Rudene Re in Treatment: 10 Information Obtained From Patient Wound History Do you currently have one or more open woundso Yes How many open wounds do you currently haveo 1 Approximately how long have you had your woundso 3 months How have you been treating your wound(s) until nowo neosporin Has your wound(s) ever healed and then re-openedo No Have you had any lab work done in the past montho No Have you tested positive for an antibiotic resistant organism (MRSA, VRE)o No Have you tested positive for osteomyelitis (bone infection)o No Have you had any tests for circulation on your legso No Cardiovascular Medical History: Positive for: Hypertension Musculoskeletal Medical History: Positive for: Gout; Osteoarthritis Immunizations Pneumococcal Vaccine: Received Pneumococcal Vaccination: Yes Immunization  Notes: up to date Implantable Devices Family and Social History Never smoker; Marital Status - Widowed; Alcohol Use: Never; Drug Use: No History; Caffeine Use: Daily; Financial Concerns: No; Food, Clothing or Shelter Needs: No; Support System Lacking: No; Transportation Concerns: No; Advanced Directives: Yes (Not Provided); Patient does not want information on Advanced Directives; Medical Power of Attorney: Yes - Ardeen Jourdain - niece (Not Provided) Physician Affirmation I have reviewed and agree with the above information. Electronic Signature(s) Signed: 06/15/2017 4:42:19 PM By: Evlyn Kanner MD, FACS Signed: 06/15/2017 4:46:33 PM By: Curtis Sites Entered By: Evlyn Kanner on 06/15/2017 11:29:23 Noreen, Heidee Elbert Ewings ([id number]) -------------------------------------------------------------------------------- SuperBill Details Patient Name: [name], [name] Date of Service: 06/15/2017 Medical Record Number: [id number] Patient Account Number: [id number] Date of Birth/Sex: May 21, 1921 (83 y.o. Female) Treating RN: Curtis Sites Primary Care Provider: Dewaine Oats Other Clinician: Referring Provider: Dewaine Oats Treating Provider/Extender: Rudene Re in Treatment: 10 Diagnosis Coding ICD-10 Codes Code Description [phone] Non-pressure chronic ulcer of other part of right foot with fat layer exposed I73.9 Peripheral vascular disease, unspecified I10 Essential (primary)  hypertension Facility Procedures CPT4 Code: [id number] Description: 11042 - DEB SUBQ TISSUE 20 SQ CM/< ICD-10 Diagnosis Description L97.512 Non-pressure chronic ulcer of other part of right foot with fat I73.9 Peripheral vascular disease, unspecified I10 Essential (primary) hypertension Modifier: layer exposed Quantity: 1 Physician Procedures CPT4 Code: [id number] Description: 11042 - WC PHYS SUBQ TISS 20 SQ CM ICD-10 Diagnosis Description L97.512 Non-pressure chronic ulcer of other part of right foot with fat I73.9  Peripheral vascular disease, unspecified I10 Essential (primary) hypertension Modifier: layer exposed Quantity: 1 Electronic Signature(s) Signed: 06/15/2017 11:31:20 AM By: Evlyn Kanner MD, FACS Entered By: Evlyn Kanner on 06/15/2017 11:31:20

## 2017-06-29 ENCOUNTER — Ambulatory Visit: Payer: Self-pay | Admitting: Surgery

## 2017-06-29 ENCOUNTER — Encounter: Payer: Medicare Other | Attending: Surgery | Admitting: Surgery

## 2017-06-29 DIAGNOSIS — I10 Essential (primary) hypertension: Secondary | ICD-10-CM | POA: Insufficient documentation

## 2017-06-29 DIAGNOSIS — M1990 Unspecified osteoarthritis, unspecified site: Secondary | ICD-10-CM | POA: Diagnosis not present

## 2017-06-29 DIAGNOSIS — M109 Gout, unspecified: Secondary | ICD-10-CM | POA: Insufficient documentation

## 2017-06-29 DIAGNOSIS — L97512 Non-pressure chronic ulcer of other part of right foot with fat layer exposed: Secondary | ICD-10-CM | POA: Diagnosis not present

## 2017-06-29 DIAGNOSIS — I739 Peripheral vascular disease, unspecified: Secondary | ICD-10-CM | POA: Diagnosis not present

## 2017-07-01 NOTE — Progress Notes (Signed)
[name] [name] ([id number]) Visit Report for 06/29/2017 Chief Complaint Document Details Patient Name: [name] [name] CREDIT. Date of Service: 06/29/2017 8:00 AM Medical Record Number: [id number] Patient Account Number: [id number] Date of Birth/Sex: 1921-03-25 (83 y.o. Female) Treating RN: Ashok Cordia, Debi Primary Care Provider: Dewaine Oats Other Clinician: Referring Provider: Dewaine Oats Treating Provider/Extender: Rudene Re in Treatment: 12 Information Obtained from: Patient Chief Complaint Patient seen for complaints of Non-Healing Wound to the right second toe Electronic Signature(s) Signed: 06/29/2017 8:24:47 AM By: Evlyn Kanner MD, FACS Entered By: Evlyn Kanner on 06/29/2017 08:24:47 [name], [name] L. ([id number]) -------------------------------------------------------------------------------- HPI Details Patient Name: [name] [name] L. Date of Service: 06/29/2017 8:00 AM Medical Record Number: [id number] Patient Account Number: [id number] Date of Birth/Sex: April 01, 1921 (83 y.o. Female) Treating RN: Ashok Cordia, Debi Primary Care Provider: TATE, [facility] Other Clinician: Referring Provider: Dewaine Oats Treating Provider/Extender: Rudene Re in Treatment: 12 History of Present Illness Location: right second toe Quality: Patient reports No Pain. Severity: Patient states wound are getting worse. Duration: Patient has had the wound for > 3 months prior to seeking treatment at the wound center Context: The wound would happen gradually Modifying Factors: Other treatment(s) tried include:asked her to wash the foot, soak it in Epsom salt and apply some Bactroban and offload it. HPI Description: very pleasant 83 year old patient, no history of diabetes or smoking has had a ulceration on the right second toe dorsum for about 3 months. Besides essential hypertension and gout she does not have any other significant problems and has never been a smoker. 04/13/17 on evaluation today  patient appears to be doing about the same in regard to her right second toe ulcer. We did get the results for x-ray which showed osteopenia without any specific bone abnormality osteomyelitis. She does continue to have discomfort with palpation of the wound although secondary to mental status is unable to rate or describe this pain. No fevers, chills, nausea, or vomiting noted at this time. 04/27/2017 -- over the last couple of weeks the patient has had some issues with a urinary tract infection and some mental status changes and after review today, I have recommended we do not do the arterial studies recommended by my colleague last week, in view of the fact that the patient's wound has improved considerably and I do not want to put her through further investigations, which may not change the treatment plan. 05/04/2017 -- the patient was seen by her podiatrist and also has a CT scan pending to workup her recent mental status changes. 05/13/17 on evaluation today patient appears to be doing about the same. The wound is not significantly smaller although there is more granulation than when I last saw this. We are continuing to manage this more conservatively as patient's family does not want to delve deeper into a lot of testing they would prefer to see how the wound progresses. Fortunately there is no evidence of infection. No fevers, chills, nausea, or vomiting noted at this time. She has been tolerating the dressing changes without complication and has only minimal discomfort that she is unable to rate or describe her pain secondary to mental status. 06/15/2017 -- 83 year old patient with significant dementia is being very well taken care of by her caregivers but the big plan is for palliative care only and no aggressive treatment is to be done. 06/29/2017 -- the caregiver confirms that the family and the power of attorney did not want any aggressive treatment to be done and we will continue  with  palliative care. She is a bit brighter today and seems to be more oriented in time and place. Electronic Signature(s) Signed: 06/29/2017 8:25:27 AM By: Evlyn Kanner MD, FACS Entered By: Evlyn Kanner on 06/29/2017 08:25:26 [name] [name] ([id number]) -------------------------------------------------------------------------------- Physical Exam Details Patient Name: [name] [name] L. Date of Service: 06/29/2017 8:00 AM Medical Record Number: [id number] Patient Account Number: [id number] Date of Birth/Sex: 07/10/1921 (83 y.o. Female) Treating RN: Phillis Haggis Primary Care Provider: Dewaine Oats Other Clinician: Referring Provider: Dewaine Oats Treating Provider/Extender: Rudene Re in Treatment: 12 Constitutional . Pulse regular. Respirations normal and unlabored. Afebrile. . Eyes Nonicteric. Reactive to light. Ears, Nose, Mouth, and Throat Lips, teeth, and gums WNL.Marland Kitchen Moist mucosa without lesions. Neck supple and nontender. No palpable supraclavicular or cervical adenopathy. Normal sized without goiter. Respiratory WNL. No retractions.. Cardiovascular Pedal Pulses WNL. No clubbing, cyanosis or edema. Lymphatic No adneopathy. No adenopathy. No adenopathy. Musculoskeletal Adexa without tenderness or enlargement.. Digits and nails w/o clubbing, cyanosis, infection, petechiae, ischemia, or inflammatory conditions.. Integumentary (Hair, Skin) No suspicious lesions. No crepitus or fluctuance. No peri-wound warmth or erythema. No masses.Marland Kitchen Psychiatric Judgement and insight Intact.. No evidence of depression, anxiety, or agitation.. Notes the superficial eschar and some of the subcutaneous area was gently probed and it easily goes down to bone. There is no cellulitis and there is no purulent drainage noted. Electronic Signature(s) Signed: 06/29/2017 8:26:42 AM By: Evlyn Kanner MD, FACS Entered By: Evlyn Kanner on 06/29/2017 08:26:41 [name] [name]  ([id number]) -------------------------------------------------------------------------------- Physician Orders Details Patient Name: [name] [name] L. Date of Service: 06/29/2017 8:00 AM Medical Record Number: [id number] Patient Account Number: [id number] Date of Birth/Sex: 04/23/1921 (83 y.o. Female) Treating RN: Curtis Sites Primary Care Provider: Dewaine Oats Other Clinician: Referring Provider: Dewaine Oats Treating Provider/Extender: Rudene Re in Treatment: 12 Verbal / Phone Orders: No Diagnosis Coding Wound Cleansing Wound #1 Right Toe Second o Clean wound with Normal Saline. Primary Wound Dressing Wound #1 Right Toe Second o Silvercel Non-Adherent Secondary Dressing Wound #1 Right Toe Second o Gauze and Kerlix/Conform - or bandaid or coverlet Dressing Change Frequency Wound #1 Right Toe Second o Other: - change the bandage on shower days or if soiled Follow-up Appointments o Return Appointment in 2 weeks. Off-Loading Wound #1 Right Toe Second o Other: - continue wearing shoes that do not rub the top of the toe Additional Orders / Instructions Wound #1 Right Toe Second o Increase protein intake. o Other: - Please add vitamin A, vitamin C and zinc supplements to your diet Electronic Signature(s) Signed: 06/29/2017 12:55:29 PM By: Evlyn Kanner MD, FACS Signed: 06/29/2017 5:07:10 PM By: Curtis Sites Entered By: Curtis Sites on 06/29/2017 08:22:38 Mullenbach, Gari LMarland Kitchen ([id number]) -------------------------------------------------------------------------------- Problem List Details Patient Name: [name] [name] L. Date of Service: 06/29/2017 8:00 AM Medical Record Number: [id number] Patient Account Number: [id number] Date of Birth/Sex: 12-10-1920 (83 y.o. Female) Treating RN: Ashok Cordia, Debi Primary Care Provider: Dewaine Oats Other Clinician: Referring Provider: Dewaine Oats Treating Provider/Extender: Rudene Re in Treatment: 12 Active  Problems ICD-10 Encounter Code Description Active Date Diagnosis L97.512 Non-pressure chronic ulcer of other part of right foot with fat layer 04/06/2017 Yes exposed I73.9 Peripheral vascular disease, unspecified 04/06/2017 Yes I10 Essential (primary) hypertension 04/06/2017 Yes Inactive Problems Resolved Problems Electronic Signature(s) Signed: 06/29/2017 8:23:54 AM By: Evlyn Kanner MD, FACS Entered By: Evlyn Kanner on 06/29/2017 08:23:54 [name], Shaterrica L. ([id number]) -------------------------------------------------------------------------------- Progress Note Details Patient Name: [name] [name] L. Date of Service: 06/29/2017 8:00 AM Medical Record Number: [id number]  Patient Account Number: [id number] Date of Birth/Sex: 09-Oct-1920 104(83 y.o. Female) Treating RN: Ashok CordiaPinkerton, Debi Primary Care Provider: Dewaine OatsATE, DENNY Other Clinician: Referring Provider: Dewaine OatsATE, DENNY Treating Provider/Extender: Rudene ReBritto, Alacia Rehmann Weeks in Treatment: 12 Subjective Chief Complaint Information obtained from Patient Patient seen for complaints of Non-Healing Wound to the right second toe History of Present Illness (HPI) The following HPI elements were documented for the patient's wound: Location: right second toe Quality: Patient reports No Pain. Severity: Patient states wound are getting worse. Duration: Patient has had the wound for > 3 months prior to seeking treatment at the wound center Context: The wound would happen gradually Modifying Factors: Other treatment(s) tried include:asked her to wash the foot, soak it in Epsom salt and apply some Bactroban and offload it. very pleasant 83 year old patient, no history of diabetes or smoking has had a ulceration on the right second toe dorsum for about 3 months. Besides essential hypertension and gout she does not have any other significant problems and has never been a smoker. 04/13/17 on evaluation today patient appears to be doing about the same in regard to her  right second toe ulcer. We did get the results for x-ray which showed osteopenia without any specific bone abnormality osteomyelitis. She does continue to have discomfort with palpation of the wound although secondary to mental status is unable to rate or describe this pain. No fevers, chills, nausea, or vomiting noted at this time. 04/27/2017 -- over the last couple of weeks the patient has had some issues with a urinary tract infection and some mental status changes and after review today, I have recommended we do not do the arterial studies recommended by my colleague last week, in view of the fact that the patient's wound has improved considerably and I do not want to put her through further investigations, which may not change the treatment plan. 05/04/2017 -- the patient was seen by her podiatrist and also has a CT scan pending to workup her recent mental status changes. 05/13/17 on evaluation today patient appears to be doing about the same. The wound is not significantly smaller although there is more granulation than when I last saw this. We are continuing to manage this more conservatively as patient's family does not want to delve deeper into a lot of testing they would prefer to see how the wound progresses. Fortunately there is no evidence of infection. No fevers, chills, nausea, or vomiting noted at this time. She has been tolerating the dressing changes without complication and has only minimal discomfort that she is unable to rate or describe her pain secondary to mental status. 06/15/2017 -- 83 year old patient with significant dementia is being very well taken care of by her caregivers but the big plan is for palliative care only and no aggressive treatment is to be done. 06/29/2017 -- the caregiver confirms that the family and the power of attorney did not want any aggressive treatment to be done and we will continue with palliative care. She is a bit brighter today and seems to  be more oriented in time and place. Patient History [name] FleetHARDY, [name] L. ([id number]) Information obtained from Patient. Social History Never smoker, Marital Status - Widowed, Alcohol Use - Never, Drug Use - No History, Caffeine Use - Daily. Objective Constitutional Pulse regular. Respirations normal and unlabored. Afebrile. Vitals Time Taken: 8:09 AM, Temperature: 97.7 F, Pulse: 58 bpm, Respiratory Rate: 16 breaths/min, Blood Pressure: 186/62 mmHg. Eyes Nonicteric. Reactive to light. Ears, Nose, Mouth, and Throat Lips, teeth, and gums  WNL.. Moist mucosa without lesions. Neck supple and nontender. No palpable supraclavicular or cervical adenopathy. Normal sized without goiter. Respiratory WNL. No retractions.. Cardiovascular Pedal Pulses WNL. No clubbing, cyanosis or edema. Lymphatic No adneopathy. No adenopathy. No adenopathy. Musculoskeletal Adexa without tenderness or enlargement.. Digits and nails w/o clubbing, cyanosis, infection, petechiae, ischemia, or inflammatory conditions.Marland Kitchen. Psychiatric Judgement and insight Intact.. No evidence of depression, anxiety, or agitation.. General Notes: the superficial eschar and some of the subcutaneous area was gently probed and it easily goes down to bone. There is no cellulitis and there is no purulent drainage noted. Integumentary (Hair, Skin) No suspicious lesions. No crepitus or fluctuance. No peri-wound warmth or erythema. No masses.. Wound #1 status is Open. Original cause of wound was Gradually Appeared. The wound is located on the Right Toe Second. The wound measures 0.4cm length x 0.6cm width x 0.3cm depth; 0.188cm^2 area and 0.057cm^3 volume. There is bone and Fat Layer (Subcutaneous Tissue) Exposed exposed. There is no tunneling or undermining noted. There is a medium amount [name] [name] L. ([id number]) of purulent drainage noted. The wound margin is flat and intact. There is large (67-100%) pink granulation within the wound bed.  There is a small (1-33%) amount of necrotic tissue within the wound bed including Adherent Slough. The periwound skin appearance did not exhibit: Callus, Crepitus, Excoriation, Induration, Rash, Scarring, Dry/Scaly, Maceration, Atrophie Blanche, Cyanosis, Ecchymosis, Hemosiderin Staining, Mottled, Pallor, Rubor, Erythema. Periwound temperature was noted as No Abnormality. The periwound has tenderness on palpation. Assessment Active Problems ICD-10 L97.512 - Non-pressure chronic ulcer of other part of right foot with fat layer exposed I73.9 - Peripheral vascular disease, unspecified I10 - Essential (primary) hypertension Plan Wound Cleansing: Wound #1 Right Toe Second: Clean wound with Normal Saline. Primary Wound Dressing: Wound #1 Right Toe Second: Silvercel Non-Adherent Secondary Dressing: Wound #1 Right Toe Second: Gauze and Kerlix/Conform - or bandaid or coverlet Dressing Change Frequency: Wound #1 Right Toe Second: Other: - change the bandage on shower days or if soiled Follow-up Appointments: Return Appointment in 2 weeks. Off-Loading: Wound #1 Right Toe Second: Other: - continue wearing shoes that do not rub the top of the toe Additional Orders / Instructions: Wound #1 Right Toe Second: Increase protein intake. Other: - Please add vitamin A, vitamin C and zinc supplements to your diet the patient is a bit more well oriented today in time and space and seems to be absolutely asymptomatic.She is a 83 year old patient, for palliative care and as per the decision of the patient's family we will continue with supporting her with local silver alginate dressing changes with a bordered foam to be done every other day. [name] FleetHARDY, [name] L. ([id number]) No aggressive therapy has been planned and if she does develop cellulitis I have instructed her caregiver to take her to the ER for appropriate conservative treatment or amputation of the right second toe. If all goes well, she will be  seen back in about 4 weeks' time Electronic Signature(s) Signed: 06/29/2017 8:28:31 AM By: Evlyn KannerBritto, Donta Mcinroy MD, FACS Entered By: Evlyn KannerBritto, Ariyanah Aguado on 06/29/2017 08:28:31 Dellis, Candyce ChurnKATIE L. ([id number]) -------------------------------------------------------------------------------- ROS/PFSH Details Patient Name: Hinostroza, Sedonia L. Date of Service: 06/29/2017 8:00 AM Medical Record Number: [id number] Patient Account Number: [id number] Date of Birth/Sex: July 24, 1920 (83 y.o. Female) Treating RN: Ashok CordiaPinkerton, Debi Primary Care Provider: Dewaine OatsATE, DENNY Other Clinician: Referring Provider: Dewaine OatsATE, DENNY Treating Provider/Extender: Rudene ReBritto, Emmeline Winebarger Weeks in Treatment: 12 Information Obtained From Patient Wound History Do you currently have one or more open woundso Yes How many open  wounds do you currently haveo 1 Approximately how long have you had your woundso 3 months How have you been treating your wound(s) until nowo neosporin Has your wound(s) ever healed and then re-openedo No Have you had any lab work done in the past montho No Have you tested positive for an antibiotic resistant organism (MRSA, VRE)o No Have you tested positive for osteomyelitis (bone infection)o No Have you had any tests for circulation on your legso No Cardiovascular Medical History: Positive for: Hypertension Musculoskeletal Medical History: Positive for: Gout; Osteoarthritis Immunizations Pneumococcal Vaccine: Received Pneumococcal Vaccination: Yes Immunization Notes: up to date Implantable Devices Family and Social History Never smoker; Marital Status - Widowed; Alcohol Use: Never; Drug Use: No History; Caffeine Use: Daily; Financial Concerns: No; Food, Clothing or Shelter Needs: No; Support System Lacking: No; Transportation Concerns: No; Advanced Directives: Yes (Not Provided); Patient does not want information on Advanced Directives; Medical Power of Attorney: Yes - Ardeen Jourdain - niece (Not Provided) Physician  Affirmation I have reviewed and agree with the above information. Electronic Signature(s) Signed: 06/29/2017 12:55:29 PM By: Evlyn Kanner MD, FACS Signed: 06/29/2017 4:53:17 PM By: Alejandro Mulling Entered By: Evlyn Kanner on 06/29/2017 08:25:33 [name], Ren [name] Ewings ([id number]) -------------------------------------------------------------------------------- SuperBill Details Patient Name: Badolato, Taira L. Date of Service: 06/29/2017 Medical Record Number: [id number] Patient Account Number: [id number] Date of Birth/Sex: [birth year] (83 y.o. Female) Treating RN: Ashok Cordia, Debi Primary Care Provider: Dewaine Oats Other Clinician: Referring Provider: Dewaine Oats Treating Provider/Extender: Rudene Re in Treatment: 12 Diagnosis Coding ICD-10 Codes Code Description L97.512 Non-pressure chronic ulcer of other part of right foot with fat layer exposed I73.9 Peripheral vascular disease, unspecified I10 Essential (primary) hypertension Facility Procedures CPT4 Code: 08657846 Description: 99213 - WOUND CARE VISIT-LEV 3 EST PT Modifier: Quantity: 1 Physician Procedures CPT4 Code: [id number] Description: 99213 - WC PHYS LEVEL 3 - EST PT ICD-10 Diagnosis Description L97.512 Non-pressure chronic ulcer of other part of right foot with fat I73.9 Peripheral vascular disease, unspecified I10 Essential (primary) hypertension Modifier: layer exposed Quantity: 1 Electronic Signature(s) Signed: 06/29/2017 9:13:36 AM By: Curtis Sites Signed: 06/29/2017 12:55:29 PM By: Evlyn Kanner MD, FACS Previous Signature: 06/29/2017 8:28:43 AM Version By: Evlyn Kanner MD, FACS Entered By: Curtis Sites on 06/29/2017 09:13:36

## 2017-07-01 NOTE — Progress Notes (Signed)
Gary FleetHARDY, Aleayah L. ([id number]) Visit Report for 06/29/2017 Arrival Information Details Patient Name: Gary FleetHARDY, Dorethea L. Date of Service: 06/29/2017 8:00 AM Medical Record Number: [id number] Patient Account Number: [id number] Date of Birth/Sex: November 10, 1920 (83 y.o. Female) Treating RN: Curtis Sitesorthy, Joanna Primary Care Kishaun Erekson: Dewaine OatsATE, DENNY Other Clinician: Referring Davione Lenker: Dewaine OatsATE, DENNY Treating Janyiah Silveri/Extender: Rudene ReBritto, Errol Weeks in Treatment: 12 Visit Information History Since Last Visit Added or deleted any medications: No Patient Arrived: Wheel Chair Any new allergies or adverse reactions: No Arrival Time: 08:07 Had a fall or experienced change in No Accompanied By: caregiver activities of daily living that may affect Transfer Assistance: None risk of falls: Patient Identification Verified: Yes Signs or symptoms of abuse/neglect since last visito No Secondary Verification Process Completed: Yes Hospitalized since last visit: No Patient Has Alerts: Yes Has Dressing in Place as Prescribed: Yes Pain Present Now: No Electronic Signature(s) Signed: 06/29/2017 5:07:10 PM By: Curtis Sitesorthy, Joanna Entered By: Curtis Sitesorthy, Joanna on 06/29/2017 08:09:38 Gayden, Candyce ChurnKATIE L. ([id number]) -------------------------------------------------------------------------------- Clinic Level of Care Assessment Details Patient Name: Gary FleetHARDY, Espyn L. Date of Service: 06/29/2017 8:00 AM Medical Record Number: [id number] Patient Account Number: [id number] Date of Birth/Sex: November 10, 1920 (83 y.o. Female) Treating RN: Curtis Sitesorthy, Joanna Primary Care Lianna Sitzmann: TATE, Katherina RightENNY Other Clinician: Referring Haeli Gerlich: Dewaine OatsATE, DENNY Treating Jovante Hammitt/Extender: Rudene ReBritto, Errol Weeks in Treatment: 12 Clinic Level of Care Assessment Items TOOL 4 Quantity Score []  - Use when only an EandM is performed on FOLLOW-UP visit 0 ASSESSMENTS - Nursing Assessment / Reassessment X - Reassessment of Co-morbidities (includes updates in patient status) 1 10 X-  1 5 Reassessment of Adherence to Treatment Plan ASSESSMENTS - Wound and Skin Assessment / Reassessment X - Simple Wound Assessment / Reassessment - one wound 1 5 []  - 0 Complex Wound Assessment / Reassessment - multiple wounds []  - 0 Dermatologic / Skin Assessment (not related to wound area) ASSESSMENTS - Focused Assessment X - Circumferential Edema Measurements - multi extremities 1 5 []  - 0 Nutritional Assessment / Counseling / Intervention X- 1 5 Lower Extremity Assessment (monofilament, tuning fork, pulses) []  - 0 Peripheral Arterial Disease Assessment (using hand held doppler) ASSESSMENTS - Ostomy and/or Continence Assessment and Care []  - Incontinence Assessment and Management 0 []  - 0 Ostomy Care Assessment and Management (repouching, etc.) PROCESS - Coordination of Care X - Simple Patient / Family Education for ongoing care 1 15 []  - 0 Complex (extensive) Patient / Family Education for ongoing care []  - 0 Staff obtains ChiropractorConsents, Records, Test Results / Process Orders []  - 0 Staff telephones HHA, Nursing Homes / Clarify orders / etc []  - 0 Routine Transfer to another Facility (non-emergent condition) []  - 0 Routine Hospital Admission (non-emergent condition) []  - 0 New Admissions / Manufacturing engineernsurance Authorizations / Ordering NPWT, Apligraf, etc. []  - 0 Emergency Hospital Admission (emergent condition) X- 1 10 Simple Discharge Coordination Curro, Caleigha L. ([id number]) []  - 0 Complex (extensive) Discharge Coordination PROCESS - Special Needs []  - Pediatric / Minor Patient Management 0 []  - 0 Isolation Patient Management []  - 0 Hearing / Language / Visual special needs []  - 0 Assessment of Community assistance (transportation, D/C planning, etc.) []  - 0 Additional assistance / Altered mentation []  - 0 Support Surface(s) Assessment (bed, cushion, seat, etc.) INTERVENTIONS - Wound Cleansing / Measurement X - Simple Wound Cleansing - one wound 1 5 []  - 0 Complex Wound  Cleansing - multiple wounds X- 1 5 Wound Imaging (photographs - any number of wounds) []  - 0 Wound Tracing (instead of photographs) X-  1 5 Simple Wound Measurement - one wound []  - 0 Complex Wound Measurement - multiple wounds INTERVENTIONS - Wound Dressings X - Small Wound Dressing one or multiple wounds 1 10 []  - 0 Medium Wound Dressing one or multiple wounds []  - 0 Large Wound Dressing one or multiple wounds []  - 0 Application of Medications - topical []  - 0 Application of Medications - injection INTERVENTIONS - Miscellaneous []  - External ear exam 0 []  - 0 Specimen Collection (cultures, biopsies, blood, body fluids, etc.) []  - 0 Specimen(s) / Culture(s) sent or taken to Lab for analysis []  - 0 Patient Transfer (multiple staff / Nurse, adultHoyer Lift / Similar devices) []  - 0 Simple Staple / Suture removal (25 or less) []  - 0 Complex Staple / Suture removal (26 or more) []  - 0 Hypo / Hyperglycemic Management (close monitor of Blood Glucose) []  - 0 Ankle / Brachial Index (ABI) - do not check if billed separately X- 1 5 Vital Signs Niehaus, Carlicia L. ([id number]) Has the patient been seen at the hospital within the last three years: Yes Total Score: 85 Level Of Care: New/Established - Level 3 Electronic Signature(s) Signed: 06/29/2017 5:07:10 PM By: Curtis Sitesorthy, Joanna Entered By: Curtis Sitesorthy, Joanna on 06/29/2017 09:13:01 Prell, Destyni Elbert EwingsL. ([id number]) -------------------------------------------------------------------------------- Encounter Discharge Information Details Patient Name: [name], Marypat L. Date of Service: 06/29/2017 8:00 AM Medical Record Number: [id number] Patient Account Number: [id number] Date of Birth/Sex: 1921-01-14 (83 y.o. Female) Treating RN: Ashok CordiaPinkerton, Debi Primary Care Cloteal Isaacson: TATE[facility] Other Clinician: Referring Flem Enderle: Dewaine OatsATE, DENNY Treating Dezmin Kittelson/Extender: Rudene ReBritto, Errol Weeks in Treatment: 12 Encounter Discharge Information Items Discharge Pain Level:  0 Discharge Condition: Stable Ambulatory Status: Wheelchair Discharge Destination: Home Transportation: Private Auto Accompanied By: caregiver Schedule Follow-up Appointment: Yes Medication Reconciliation completed and No provided to Patient/Care Jesus Poplin: Provided on Clinical Summary of Care: 06/29/2017 Form Type Recipient Paper [facility] Electronic Signature(s) Signed: 06/29/2017 9:14:18 AM By: Curtis Sitesorthy, Joanna Entered By: Curtis Sitesorthy, Joanna on 06/29/2017 09:14:18 Hullum, Francene Elbert EwingsL. ([id number]) -------------------------------------------------------------------------------- Lower Extremity Assessment Details Patient Name: Woodrum, Francisca L. Date of Service: 06/29/2017 8:00 AM Medical Record Number: [id number] Patient Account Number: [id number] Date of Birth/Sex: 1921-01-14 (83 y.o. Female) Treating RN: Curtis Sitesorthy, Joanna Primary Care Linh Johannes: Dewaine OatsATE, DENNY Other Clinician: Referring Daimon Kean: Dewaine OatsATE, DENNY Treating Edyth Glomb/Extender: Rudene ReBritto, Errol Weeks in Treatment: 12 Vascular Assessment Pulses: Dorsalis Pedis Palpable: [Right:No] Doppler Audible: [Right:Inaudible] Posterior Tibial Extremity colors, hair growth, and conditions: Extremity Color: [Right:Hyperpigmented] Hair Growth on Extremity: [Right:No] Temperature of Extremity: [Right:Cool] Capillary Refill: [Right:< 3 seconds] Electronic Signature(s) Signed: 06/29/2017 5:07:10 PM By: Curtis Sitesorthy, Joanna Entered By: Curtis Sitesorthy, Joanna on 06/29/2017 08:18:18 Brunelle, Charma L. ([id number]) -------------------------------------------------------------------------------- Multi Wound Chart Details Patient Name: Erisman, Aleaya L. Date of Service: 06/29/2017 8:00 AM Medical Record Number: [id number] Patient Account Number: [id number] Date of Birth/Sex: 1921-01-14 (83 y.o. Female) Treating RN: Curtis Sitesorthy, Joanna Primary Care Shakeerah Gradel: TATE, Katherina RightENNY Other Clinician: Referring Tiena Manansala: Dewaine OatsATE, DENNY Treating Tzipporah Nagorski/Extender: Rudene ReBritto, Errol Weeks in  Treatment: 12 Vital Signs Height(in): Pulse(bpm): 58 Weight(lbs): Blood Pressure(mmHg): 186/62 Body Mass Index(BMI): Temperature(F): 97.7 Respiratory Rate 16 (breaths/min): Photos: [1:No Photos] [N/A:N/A] Wound Location: [1:Right Toe Second] [N/A:N/A] Wounding Event: [1:Gradually Appeared] [N/A:N/A] Primary Etiology: [1:Arterial Insufficiency Ulcer] [N/A:N/A] Comorbid History: [1:Hypertension, Gout, Osteoarthritis] [N/A:N/A] Date Acquired: [1:12/26/2016] [N/A:N/A] Weeks of Treatment: [1:12] [N/A:N/A] Wound Status: [1:Open] [N/A:N/A] Pending Amputation on [1:Yes] [N/A:N/A] Presentation: Measurements L x W x D [1:0.4x0.6x0.3] [N/A:N/A] (cm) Area (cm) : [1:0.188] [N/A:N/A] Volume (cm) : [1:0.057] [N/A:N/A] % Reduction in Area: [1:-100.00%] [N/A:N/A] % Reduction in Volume: [1:-533.30%] [N/A:N/A] Classification: [  1:Full Thickness With Exposed Support Structures] [N/A:N/A] Exudate Amount: [1:Medium] [N/A:N/A] Exudate Type: [1:Purulent] [N/A:N/A] Exudate Color: [1:yellow, brown, green] [N/A:N/A] Wound Margin: [1:Flat and Intact] [N/A:N/A] Granulation Amount: [1:Large (67-100%)] [N/A:N/A] Granulation Quality: [1:Pink] [N/A:N/A] Necrotic Amount: [1:Small (1-33%)] [N/A:N/A] Exposed Structures: [1:Fat Layer (Subcutaneous Tissue) Exposed: Yes Bone: Yes Fascia: No Tendon: No Muscle: No Joint: No] [N/A:N/A] Epithelialization: [1:None] [N/A:N/A] Periwound Skin Texture: [1:Excoriation: No Induration: No Callus: No] [N/A:N/A] Crepitus: No Rash: No Scarring: No Periwound Skin Moisture: Maceration: No N/A N/A Dry/Scaly: No Periwound Skin Color: Atrophie Blanche: No N/A N/A Cyanosis: No Ecchymosis: No Erythema: No Hemosiderin Staining: No Mottled: No Pallor: No Rubor: No Temperature: No Abnormality N/A N/A Tenderness on Palpation: Yes N/A N/A Wound Preparation: Ulcer Cleansing: N/A N/A Rinsed/Irrigated with Saline Topical Anesthetic Applied: Other: lidocaine  4% Treatment Notes Electronic Signature(s) Signed: 06/29/2017 8:24:41 AM By: Evlyn Kanner MD, FACS Entered By: Evlyn Kanner on 06/29/2017 08:24:41 Prime, [name] ([id number]) -------------------------------------------------------------------------------- Multi-Disciplinary Care Plan Details Patient Name: Xu, Lanique L. Date of Service: 06/29/2017 8:00 AM Medical Record Number: [id number] Patient Account Number: [id number] Date of Birth/Sex: Dec 09, 1920 (83 y.o. Female) Treating RN: Curtis Sites Primary Care Alexes Menchaca: Dewaine Oats Other Clinician: Referring Thomasa Heidler: Dewaine Oats Treating Eldonna Neuenfeldt/Extender: Rudene Re in Treatment: 12 Active Inactive ` Abuse / Safety / Falls / Self Care Management Nursing Diagnoses: Impaired physical mobility Goals: Patient will remain injury free related to falls Date Initiated: 04/06/2017 Target Resolution Date: 06/22/2017 Goal Status: Active Interventions: Assess fall risk on admission and as needed Notes: ` Orientation to the Wound Care Program Nursing Diagnoses: Knowledge deficit related to the wound healing center program Goals: Patient/caregiver will verbalize understanding of the Wound Healing Center Program Date Initiated: 04/06/2017 Target Resolution Date: 06/22/2017 Goal Status: Active Interventions: Provide education on orientation to the wound center Notes: ` Wound/Skin Impairment Nursing Diagnoses: Impaired tissue integrity Goals: Ulcer/skin breakdown will heal within 14 weeks Date Initiated: 04/06/2017 Target Resolution Date: 06/22/2017 Goal Status: Active Interventions: CYLA, HALUSKA ([id number]) Assess patient/caregiver ability to obtain necessary supplies Assess patient/caregiver ability to perform ulcer/skin care regimen upon admission and as needed Assess ulceration(s) every visit Notes: Electronic Signature(s) Signed: 06/29/2017 5:07:10 PM By: Curtis Sites Entered By: Curtis Sites on 06/29/2017  08:18:22 Charpentier, Shawnya L. ([id number]) -------------------------------------------------------------------------------- Pain Assessment Details Patient Name: Holik, Tahesha L. Date of Service: 06/29/2017 8:00 AM Medical Record Number: [id number] Patient Account Number: [id number] Date of Birth/Sex: [birth year] (83 y.o. Female) Treating RN: Curtis Sites Primary Care Telly Jawad: Dewaine Oats Other Clinician: Referring Sharlene Mccluskey: Dewaine Oats Treating Yuridia Couts/Extender: Rudene Re in Treatment: 12 Active Problems Location of Pain Severity and Description of Pain Patient Has Paino No Site Locations Pain Management and Medication Current Pain Management: Electronic Signature(s) Signed: 06/29/2017 5:07:10 PM By: Curtis Sites Entered By: Curtis Sites on 06/29/2017 08:09:44 Candela, [name] ([id number]) -------------------------------------------------------------------------------- Patient/Caregiver Education Details Patient Name: [name] Date of Service: 06/29/2017 8:00 AM Medical Record Number: [id number] Patient Account Number: [id number] Date of Birth/Gender: [birth year] (83 y.o. Female) Treating RN: Curtis Sites Primary Care Physician: Dewaine Oats Other Clinician: Referring Physician: Dewaine Oats Treating Physician/Extender: Rudene Re in Treatment: 12 Education Assessment Education Provided To: Caregiver Education Topics Provided Wound/Skin Impairment: Handouts: Other: wound care as ordered Methods: Demonstration, Explain/Verbal Responses: State content correctly Electronic Signature(s) Signed: 06/29/2017 5:07:10 PM By: Curtis Sites Entered By: Curtis Sites on 06/29/2017 09:14:38 Maslow, Shanika L. ([id number]) -------------------------------------------------------------------------------- Wound Assessment Details Patient Name: Ormiston, Cindel L. Date of Service: 06/29/2017 8:00 AM Medical Record Number: [id number]  Patient Account Number:  [id number] Date of Birth/Sex: [birth year] (83 y.o. Female) Treating RN: Curtis Sites Primary Care Dezeray Puccio: Dewaine Oats Other Clinician: Referring Keishon Chavarin: Dewaine Oats Treating Rajeev Escue/Extender: Rudene Re in Treatment: 12 Wound Status Wound Number: 1 Primary Etiology: Arterial Insufficiency Ulcer Wound Location: Right Toe Second Wound Status: Open Wounding Event: Gradually Appeared Comorbid History: Hypertension, Gout, Osteoarthritis Date Acquired: 12/26/2016 Weeks Of Treatment: 12 Clustered Wound: No Pending Amputation On Presentation Wound Measurements Length: (cm) 0.4 Width: (cm) 0.6 Depth: (cm) 0.3 Area: (cm) 0.188 Volume: (cm) 0.057 % Reduction in Area: -100% % Reduction in Volume: -533.3% Epithelialization: None Tunneling: No Undermining: No Wound Description Full Thickness With Exposed Support Classification: Structures Wound Margin: Flat and Intact Exudate Medium Amount: Exudate Type: Purulent Exudate Color: yellow, brown, green Foul Odor After Cleansing: No Slough/Fibrino Yes Wound Bed Granulation Amount: Large (67-100%) Exposed Structure Granulation Quality: Pink Fascia Exposed: No Necrotic Amount: Small (1-33%) Fat Layer (Subcutaneous Tissue) Exposed: Yes Necrotic Quality: Adherent Slough Tendon Exposed: No Muscle Exposed: No Joint Exposed: No Bone Exposed: Yes Periwound Skin Texture Texture Color No Abnormalities Noted: No No Abnormalities Noted: No Callus: No Atrophie Blanche: No Crepitus: No Cyanosis: No Excoriation: No Ecchymosis: No Induration: No Erythema: No Rash: No Hemosiderin Staining: No Scarring: No Mottled: No Pallor: No Moisture Rubor: No No Abnormalities Noted: No Falter, Alayjah L. ([id number]) Dry / Scaly: No Temperature / Pain Maceration: No Temperature: No Abnormality Tenderness on Palpation: Yes Wound Preparation Ulcer Cleansing: Rinsed/Irrigated with Saline Topical Anesthetic Applied: Other:  lidocaine 4%, Treatment Notes Wound #1 (Right Toe Second) 1. Cleansed with: Clean wound with Normal Saline 2. Anesthetic Topical Lidocaine 4% cream to wound bed prior to debridement 4. Dressing Applied: Other dressing (specify in notes) 5. Secondary Dressing Applied Gauze and Kerlix/Conform 7. Secured with Tape Notes silvercel Electronic Signature(s) Signed: 06/29/2017 5:07:10 PM By: Curtis Sites Entered By: Curtis Sites on 06/29/2017 08:13:16 Labate, Madeleyn Elbert Ewings ([id number]) -------------------------------------------------------------------------------- Vitals Details Patient Name: Erler, Teffany L. Date of Service: 06/29/2017 8:00 AM Medical Record Number: [id number] Patient Account Number: [id number] Date of Birth/Sex: [birth year] (83 y.o. Female) Treating RN: Curtis Sites Primary Care Tanice Petre: TATE, Katherina Right Other Clinician: Referring Emileigh Kellett: Dewaine Oats Treating Chelly Dombeck/Extender: Rudene Re in Treatment: 12 Vital Signs Time Taken: 08:09 Temperature (F): 97.7 Pulse (bpm): 58 Respiratory Rate (breaths/min): 16 Blood Pressure (mmHg): 186/62 Reference Range: 80 - 120 mg / dl Electronic Signature(s) Signed: 06/29/2017 5:07:10 PM By: Curtis Sites Entered By: Curtis Sites on 06/29/2017 08:10:54

## 2017-07-13 ENCOUNTER — Encounter: Payer: Medicare Other | Admitting: Surgery

## 2017-07-13 DIAGNOSIS — L97512 Non-pressure chronic ulcer of other part of right foot with fat layer exposed: Secondary | ICD-10-CM | POA: Diagnosis not present

## 2017-07-14 NOTE — Progress Notes (Signed)
MAHLET, JERGENS ([id number]) Visit Report for 07/13/2017 Chief Complaint Document Details Patient Name: [name], [name] Date of Service: 07/13/2017 11:00 AM Medical Record Number: [id number] Patient Account Number: [id number] Date of Birth/Sex: [birth year] (83 y.o. Female) Treating RN: Curtis Sites Primary Care Provider: Dewaine Oats Other Clinician: Referring Provider: Dewaine Oats Treating Provider/Extender: Rudene Re in Treatment: 14 Information Obtained from: Patient Chief Complaint Patient seen for complaints of Non-Healing Wound to the right second toe Electronic Signature(s) Signed: 07/13/2017 11:47:45 AM By: Evlyn Kanner MD, FACS Entered By: Evlyn Kanner on 07/13/2017 11:47:45 [name], [name] ([id number]) -------------------------------------------------------------------------------- HPI Details Patient Name: [name], [name] Date of Service: 07/13/2017 11:00 AM Medical Record Number: [id number] Patient Account Number: [id number] Date of Birth/Sex: 1920-11-02 (83 y.o. Female) Treating RN: Curtis Sites Primary Care Provider: Dewaine Oats Other Clinician: Referring Provider: Dewaine Oats Treating Provider/Extender: Rudene Re in Treatment: 14 History of Present Illness Location: right second toe Quality: Patient reports No Pain. Severity: Patient states wound are getting worse. Duration: Patient has had the wound for > 3 months prior to seeking treatment at the wound center Context: The wound would happen gradually Modifying Factors: Other treatment(s) tried include:asked her to wash the foot, soak it in Epsom salt and apply some Bactroban and offload it. HPI Description: very pleasant 83 year old patient, no history of diabetes or smoking has had a ulceration on the right second toe dorsum for about 3 months. Besides essential hypertension and gout she does not have any other significant problems and has never been a smoker. 04/13/17 on evaluation today  patient appears to be doing about the same in regard to her right second toe ulcer. We did get the results for x-ray which showed osteopenia without any specific bone abnormality osteomyelitis. She does continue to have discomfort with palpation of the wound although secondary to mental status is unable to rate or describe this pain. No fevers, chills, nausea, or vomiting noted at this time. 04/27/2017 -- over the last couple of weeks the patient has had some issues with a urinary tract infection and some mental status changes and after review today, I have recommended we do not do the arterial studies recommended by my colleague last week, in view of the fact that the patient's wound has improved considerably and I do not want to put her through further investigations, which may not change the treatment plan. 05/04/2017 -- the patient was seen by her podiatrist and also has a CT scan pending to workup her recent mental status changes. 05/13/17 on evaluation today patient appears to be doing about the same. The wound is not significantly smaller although there is more granulation than when I last saw this. We are continuing to manage this more conservatively as patient's family does not want to delve deeper into a lot of testing they would prefer to see how the wound progresses. Fortunately there is no evidence of infection. No fevers, chills, nausea, or vomiting noted at this time. She has been tolerating the dressing changes without complication and has only minimal discomfort that she is unable to rate or describe her pain secondary to mental status. 06/15/2017 -- 83 year old patient with significant dementia is being very well taken care of by her caregivers but the big plan is for palliative care only and no aggressive treatment is to be done. 06/29/2017 -- the caregiver confirms that the family and the power of attorney did not want any aggressive treatment to be done and we will continue  with  palliative care. She is a bit brighter today and seems to be more oriented in time and place. 07/13/2017 -- 83 year old with a open wound on the right second toe dorsum probing down to bone has had a stable toe without any evidence of cellulitis and the power of attorney does not want any aggressive treatment to be done unless absolutely essential. We have been giving her palliative care for a while and today she is very awake and alert and seems well oriented in time and space. Electronic Signature(s) Signed: 07/13/2017 11:48:42 AM By: Evlyn KannerBritto, Gelila Well MD, FACS Entered By: Evlyn KannerBritto, Haroldine Redler on 07/13/2017 11:48:42 [name], [name] ([id number]) [name], [name] ([id number]) -------------------------------------------------------------------------------- Physical Exam Details Patient Name: [name], [name] Date of Service: 07/13/2017 11:00 AM Medical Record Number: [id number] Patient Account Number: [id number] Date of Birth/Sex: 1920/10/11 (83 y.o. Female) Treating RN: Curtis Sitesorthy, Joanna Primary Care Provider: Dewaine OatsATE, DENNY Other Clinician: Referring Provider: Dewaine OatsATE, DENNY Treating Provider/Extender: Rudene ReBritto, Nolah Krenzer Weeks in Treatment: 14 Constitutional . Pulse regular. Respirations normal and unlabored. Afebrile. . Eyes Nonicteric. Reactive to light. Ears, Nose, Mouth, and Throat Lips, teeth, and gums WNL.Marland Kitchen. Moist mucosa without lesions. Neck supple and nontender. No palpable supraclavicular or cervical adenopathy. Normal sized without goiter. Respiratory WNL. No retractions.. Cardiovascular Pedal Pulses WNL. No clubbing, cyanosis or edema. Gastrointestinal (GI) Abdomen without masses or tenderness.. No liver or spleen enlargement or tenderness.. Lymphatic No adneopathy. No adenopathy. No adenopathy. Musculoskeletal Adexa without tenderness or enlargement.. Digits and nails w/o clubbing, cyanosis, infection, petechiae, ischemia, or inflammatory conditions.. Integumentary (Hair, Skin) No  suspicious lesions. No crepitus or fluctuance. No peri-wound warmth or erythema. No masses.Marland Kitchen. Psychiatric Judgement and insight Intact.. No evidence of depression, anxiety, or agitation.. Notes the patient's wound is looking good and was washed out with moist saline gauze and easily probes down to bone. There is no cellulitis and there is no purulent discharge or drainage. Electronic Signature(s) Signed: 07/13/2017 11:49:23 AM By: Evlyn KannerBritto, Aarsh Fristoe MD, FACS Entered By: Evlyn KannerBritto, Erabella Kuipers on 07/13/2017 11:49:22 Mcjunkin, [name] ([id number]) -------------------------------------------------------------------------------- Physician Orders Details Patient Name: [name], [name] Date of Service: 07/13/2017 11:00 AM Medical Record Number: [id number] Patient Account Number: [id number] Date of Birth/Sex: 1920/10/11 (83 y.o. Female) Treating RN: Curtis Sitesorthy, Joanna Primary Care Provider: Dewaine OatsATE, DENNY Other Clinician: Referring Provider: Dewaine OatsATE, DENNY Treating Provider/Extender: Rudene ReBritto, Hartwell Vandiver Weeks in Treatment: 14 Verbal / Phone Orders: No Diagnosis Coding Wound Cleansing Wound #1 Right Toe Second o Clean wound with Normal Saline. Primary Wound Dressing Wound #1 Right Toe Second o Silvercel Non-Adherent Secondary Dressing Wound #1 Right Toe Second o Gauze and Kerlix/Conform - or bandaid or coverlet Dressing Change Frequency Wound #1 Right Toe Second o Other: - change the bandage on shower days or if soiled Follow-up Appointments o Return Appointment in 2 weeks. Off-Loading Wound #1 Right Toe Second o Other: - continue wearing shoes that do not rub the top of the toe Additional Orders / Instructions Wound #1 Right Toe Second o Increase protein intake. o Other: - Please add vitamin A, vitamin C and zinc supplements to your diet Electronic Signature(s) Signed: 07/13/2017 1:42:42 PM By: Evlyn KannerBritto, Maribel Hadley MD, FACS Signed: 07/13/2017 4:57:04 PM By: Curtis Sitesorthy, Joanna Entered By: Curtis Sitesorthy, Joanna on  07/13/2017 11:27:58 Burleson, Monya L. ([id number]) -------------------------------------------------------------------------------- Problem List Details Patient Name: [name], [name] Date of Service: 07/13/2017 11:00 AM Medical Record Number: [id number] Patient Account Number: [id number] Date of Birth/Sex: 1920/10/11 (83 y.o. Female) Treating RN: Curtis Sitesorthy, Joanna Primary Care Provider: Dewaine OatsATE, DENNY Other Clinician: Referring Provider: Dewaine OatsATE, DENNY Treating  Provider/Extender: Rudene Re in Treatment: 14 Active Problems ICD-10 Encounter Code Description Active Date Diagnosis L97.512 Non-pressure chronic ulcer of other part of right foot with fat layer 04/06/2017 Yes exposed I73.9 Peripheral vascular disease, unspecified 04/06/2017 Yes I10 Essential (primary) hypertension 04/06/2017 Yes Inactive Problems Resolved Problems Electronic Signature(s) Signed: 07/13/2017 11:47:33 AM By: Evlyn Kanner MD, FACS Entered By: Evlyn Kanner on 07/13/2017 11:47:33 Heinlein, Zelene L. ([id number]) -------------------------------------------------------------------------------- Progress Note Details Patient Name: [name], [name] Date of Service: 07/13/2017 11:00 AM Medical Record Number: [id number] Patient Account Number: [id number] Date of Birth/Sex: 09-Sep-1920 (83 y.o. Female) Treating RN: Curtis Sites Primary Care Provider: Dewaine Oats Other Clinician: Referring Provider: Dewaine Oats Treating Provider/Extender: Rudene Re in Treatment: 14 Subjective Chief Complaint Information obtained from Patient Patient seen for complaints of Non-Healing Wound to the right second toe History of Present Illness (HPI) The following HPI elements were documented for the patient's wound: Location: right second toe Quality: Patient reports No Pain. Severity: Patient states wound are getting worse. Duration: Patient has had the wound for > 3 months prior to seeking treatment at the wound center Context:  The wound would happen gradually Modifying Factors: Other treatment(s) tried include:asked her to wash the foot, soak it in Epsom salt and apply some Bactroban and offload it. very pleasant 83 year old patient, no history of diabetes or smoking has had a ulceration on the right second toe dorsum for about 3 months. Besides essential hypertension and gout she does not have any other significant problems and has never been a smoker. 04/13/17 on evaluation today patient appears to be doing about the same in regard to her right second toe ulcer. We did get the results for x-ray which showed osteopenia without any specific bone abnormality osteomyelitis. She does continue to have discomfort with palpation of the wound although secondary to mental status is unable to rate or describe this pain. No fevers, chills, nausea, or vomiting noted at this time. 04/27/2017 -- over the last couple of weeks the patient has had some issues with a urinary tract infection and some mental status changes and after review today, I have recommended we do not do the arterial studies recommended by my colleague last week, in view of the fact that the patient's wound has improved considerably and I do not want to put her through further investigations, which may not change the treatment plan. 05/04/2017 -- the patient was seen by her podiatrist and also has a CT scan pending to workup her recent mental status changes. 05/13/17 on evaluation today patient appears to be doing about the same. The wound is not significantly smaller although there is more granulation than when I last saw this. We are continuing to manage this more conservatively as patient's family does not want to delve deeper into a lot of testing they would prefer to see how the wound progresses. Fortunately there is no evidence of infection. No fevers, chills, nausea, or vomiting noted at this time. She has been tolerating the dressing changes without  complication and has only minimal discomfort that she is unable to rate or describe her pain secondary to mental status. 06/15/2017 -- 83 year old patient with significant dementia is being very well taken care of by her caregivers but the big plan is for palliative care only and no aggressive treatment is to be done. 06/29/2017 -- the caregiver confirms that the family and the power of attorney did not want any aggressive treatment to be done and we will continue with palliative  care. She is a bit brighter today and seems to be more oriented in time and place. 07/13/2017 -- 83 year old with a open wound on the right second toe dorsum probing down to bone has had a stable toe without any evidence of cellulitis and the power of attorney does not want any aggressive treatment to be done unless [name], [name] ([id number]) absolutely essential. We have been giving her palliative care for a while and today she is very awake and alert and seems well oriented in time and space. Patient History Information obtained from Patient. Social History Never smoker, Marital Status - Widowed, Alcohol Use - Never, Drug Use - No History, Caffeine Use - Daily. Objective Constitutional Pulse regular. Respirations normal and unlabored. Afebrile. Vitals Time Taken: 11:06 AM, Temperature: 98.4 F, Pulse: 62 bpm, Respiratory Rate: 16 breaths/min, Blood Pressure: 132/70 mmHg. Eyes Nonicteric. Reactive to light. Ears, Nose, Mouth, and Throat Lips, teeth, and gums WNL.Marland Kitchen Moist mucosa without lesions. Neck supple and nontender. No palpable supraclavicular or cervical adenopathy. Normal sized without goiter. Respiratory WNL. No retractions.. Cardiovascular Pedal Pulses WNL. No clubbing, cyanosis or edema. Gastrointestinal (GI) Abdomen without masses or tenderness.. No liver or spleen enlargement or tenderness.. Lymphatic No adneopathy. No adenopathy. No adenopathy. Musculoskeletal Adexa without tenderness or  enlargement.. Digits and nails w/o clubbing, cyanosis, infection, petechiae, ischemia, or inflammatory conditions.Marland Kitchen Psychiatric Judgement and insight Intact.. No evidence of depression, anxiety, or agitation.. General Notes: the patient's wound is looking good and was washed out with moist saline gauze and easily probes down to [name], [name] ([id number]) bone. There is no cellulitis and there is no purulent discharge or drainage. Integumentary (Hair, Skin) No suspicious lesions. No crepitus or fluctuance. No peri-wound warmth or erythema. No masses.. Wound #1 status is Open. Original cause of wound was Gradually Appeared. The wound is located on the Right Toe Second. The wound measures 0.5cm length x 0.6cm width x 0.3cm depth; 0.236cm^2 area and 0.071cm^3 volume. There is bone and Fat Layer (Subcutaneous Tissue) Exposed exposed. There is no tunneling or undermining noted. There is a medium amount of purulent drainage noted. The wound margin is flat and intact. There is large (67-100%) pink granulation within the wound bed. There is a small (1-33%) amount of necrotic tissue within the wound bed including Adherent Slough. The periwound skin appearance did not exhibit: Callus, Crepitus, Excoriation, Induration, Rash, Scarring, Dry/Scaly, Maceration, Atrophie Blanche, Cyanosis, Ecchymosis, Hemosiderin Staining, Mottled, Pallor, Rubor, Erythema. Periwound temperature was noted as No Abnormality. The periwound has tenderness on palpation. Assessment Active Problems ICD-10 L97.512 - Non-pressure chronic ulcer of other part of right foot with fat layer exposed I73.9 - Peripheral vascular disease, unspecified I10 - Essential (primary) hypertension Plan Wound Cleansing: Wound #1 Right Toe Second: Clean wound with Normal Saline. Primary Wound Dressing: Wound #1 Right Toe Second: Silvercel Non-Adherent Secondary Dressing: Wound #1 Right Toe Second: Gauze and Kerlix/Conform - or bandaid or  coverlet Dressing Change Frequency: Wound #1 Right Toe Second: Other: - change the bandage on shower days or if soiled Follow-up Appointments: Return Appointment in 2 weeks. Off-Loading: Wound #1 Right Toe Second: Other: - continue wearing shoes that do not rub the top of the toe Additional Orders / Instructions: Wound #1 Right Toe Second: Increase protein intake. Other: - Please add vitamin A, vitamin C and zinc supplements to your diet [name], [name] L. ([id number]) the patient looks good overall today and seems much brighter and well oriented in time and space. She is a 83 year old patient,  for palliative care and as per the decision of the patient's family we will continue with supporting her with local silver alginate dressing changes with a bordered foam to be done every other day. No aggressive therapy has been planned and if she does develop cellulitis, I have instructed her caregiver to take her to the ER for appropriate conservative treatment or amputation of the right second toe. If all goes well, she will be seen back in about 4 weeks' time Electronic Signature(s) Signed: 07/13/2017 11:50:35 AM By: Evlyn KannerBritto, Elenna Spratling MD, FACS Entered By: Evlyn KannerBritto, Saliyah Gillin on 07/13/2017 11:50:35 Ghanem, Kylani L. ([id number]) -------------------------------------------------------------------------------- ROS/PFSH Details Patient Name: Lyall, Cinthia L. Date of Service: 07/13/2017 11:00 AM Medical Record Number: [id number] Patient Account Number: [id number] Date of Birth/Sex: 08/26/1920 (83 y.o. Female) Treating RN: Curtis Sitesorthy, Joanna Primary Care Provider: TATE, Katherina RightENNY Other Clinician: Referring Provider: Dewaine OatsATE, DENNY Treating Provider/Extender: Rudene ReBritto, Toretto Tingler Weeks in Treatment: 14 Information Obtained From Patient Wound History Do you currently have one or more open woundso Yes How many open wounds do you currently haveo 1 Approximately how long have you had your woundso 3 months How have you been  treating your wound(s) until nowo neosporin Has your wound(s) ever healed and then re-openedo No Have you had any lab work done in the past montho No Have you tested positive for an antibiotic resistant organism (MRSA, VRE)o No Have you tested positive for osteomyelitis (bone infection)o No Have you had any tests for circulation on your legso No Cardiovascular Medical History: Positive for: Hypertension Musculoskeletal Medical History: Positive for: Gout; Osteoarthritis Immunizations Pneumococcal Vaccine: Received Pneumococcal Vaccination: Yes Immunization Notes: up to date Implantable Devices Family and Social History Never smoker; Marital Status - Widowed; Alcohol Use: Never; Drug Use: No History; Caffeine Use: Daily; Financial Concerns: No; Food, Clothing or Shelter Needs: No; Support System Lacking: No; Transportation Concerns: No; Advanced Directives: Yes (Not Provided); Patient does not want information on Advanced Directives; Medical Power of Attorney: Yes - Ardeen Jourdainndrey Corbett - niece (Not Provided) Physician Affirmation I have reviewed and agree with the above information. Electronic Signature(s) Signed: 07/13/2017 1:42:42 PM By: Evlyn KannerBritto, Omarius Grantham MD, FACS Signed: 07/13/2017 4:57:04 PM By: Curtis Sitesorthy, Joanna Entered By: Evlyn KannerBritto, Yisel Megill on 07/13/2017 11:48:51 Whorley, Lizeth Elbert EwingsL. ([id number]) -------------------------------------------------------------------------------- SuperBill Details Patient Name: Borchers, Ja L. Date of Service: 07/13/2017 Medical Record Number: [id number] Patient Account Number: [id number] Date of Birth/Sex: 09/21/1920 (83 y.o. Female) Treating RN: Curtis Sitesorthy, Joanna Primary Care Provider: Dewaine OatsATE, DENNY Other Clinician: Referring Provider: Dewaine OatsATE, DENNY Treating Provider/Extender: Rudene ReBritto, Emmersen Garraway Weeks in Treatment: 14 Diagnosis Coding ICD-10 Codes Code Description L97.512 Non-pressure chronic ulcer of other part of right foot with fat layer exposed I73.9 Peripheral  vascular disease, unspecified I10 Essential (primary) hypertension Physician Procedures CPT4 Code: [id number] Description: 99213 - WC PHYS LEVEL 3 - EST PT ICD-10 Diagnosis Description L97.512 Non-pressure chronic ulcer of other part of right foot with fat I73.9 Peripheral vascular disease, unspecified I10 Essential (primary) hypertension Modifier: layer exposed Quantity: 1 Electronic Signature(s) Signed: 07/13/2017 11:50:57 AM By: Evlyn KannerBritto, Aleksandra Raben MD, FACS Entered By: Evlyn KannerBritto, Aryana Wonnacott on 07/13/2017 11:50:57

## 2017-07-16 NOTE — Progress Notes (Addendum)
[name], [name] ([id number]) Visit Report for 07/13/2017 Arrival Information Details Patient Name: [name], [name] Date of Service: 07/13/2017 11:00 AM Medical Record Number: [id number] Patient Account Number: [id number] Date of Birth/Sex: 03-08-1921 (83 y.o. Female) Treating RN: [name] Primary Care [name]: [name], DENNY Other Clinician: Referring Detavious Rinn: [name], DENNY Treating [name]: 14 Visit Information History Since Last Visit Added or deleted any medications: No Patient Arrived: Wheel Chair Any new allergies or adverse reactions: No Arrival Time: 11:03 Had a fall or experienced change in No Accompanied By: staff activities of daily living that may affect Transfer Assistance: None risk of falls: Patient Identification Verified: Yes Signs or symptoms of abuse/neglect since last visito No Secondary Verification Process Completed: Yes Hospitalized since last visit: No Patient Has Alerts: Yes Has Dressing in Place as Prescribed: Yes Pain Present Now: No Electronic Signature(s) Signed: 07/13/2017 4:57:04 PM By: [name] Entered By: [name] on 07/13/2017 11:04:15 [name], [name] ([id number]) -------------------------------------------------------------------------------- Clinic Level of Care Assessment Details Patient Name: [name], [name] Date of Service: 07/13/2017 11:00 AM Medical Record Number: [id number] Patient Account Number: [id number] Date of Birth/Sex: 03-08-1921 (83 y.o. Female) Treating RN: [name] Primary Care Lexxie Winberg: TATE, Katherina RightENNY Other Clinician: Referring Adrien Dietzman: [name], DENNY Treating Elah Avellino/Extender: [name] in [name]: 14 Clinic Level of Care Assessment Items TOOL 4 Quantity Score []  - Use when only an EandM is performed on FOLLOW-UP visit 0 ASSESSMENTS - Nursing Assessment / Reassessment X - Reassessment of Co-morbidities (includes updates in patient status) 1 10 X- 1  5 Reassessment of Adherence to [name] Plan ASSESSMENTS - Wound and Skin Assessment / Reassessment X - Simple Wound Assessment / Reassessment - one wound 1 5 []  - 0 Complex Wound Assessment / Reassessment - multiple wounds []  - 0 Dermatologic / Skin Assessment (not related to wound area) ASSESSMENTS - Focused Assessment []  - Circumferential Edema Measurements - multi extremities 0 []  - 0 Nutritional Assessment / Counseling / Intervention X- 1 5 Lower Extremity Assessment (monofilament, tuning fork, pulses) []  - 0 Peripheral Arterial Disease Assessment (using hand held doppler) ASSESSMENTS - Ostomy and/or Continence Assessment and Care []  - Incontinence Assessment and Management 0 []  - 0 Ostomy Care Assessment and Management (repouching, etc.) PROCESS - Coordination of Care X - Simple Patient / Family Education for ongoing care 1 15 []  - 0 Complex (extensive) Patient / Family Education for ongoing care []  - 0 Staff obtains ChiropractorConsents, Records, Test Results / Process Orders []  - 0 Staff telephones HHA, Nursing Homes / Clarify orders / etc []  - 0 Routine Transfer to another Facility (non-emergent condition) []  - 0 Routine Hospital Admission (non-emergent condition) []  - 0 New Admissions / Manufacturing engineernsurance Authorizations / Ordering NPWT, Apligraf, etc. []  - 0 Emergency Hospital Admission (emergent condition) X- 1 10 Simple Discharge Coordination [name], [name] ([id number]) []  - 0 Complex (extensive) Discharge Coordination PROCESS - Special Needs []  - Pediatric / Minor Patient Management 0 []  - 0 Isolation Patient Management []  - 0 Hearing / Language / Visual special needs []  - 0 Assessment of Community assistance (transportation, D/C planning, etc.) []  - 0 Additional assistance / Altered mentation []  - 0 Support Surface(s) Assessment (bed, cushion, seat, etc.) INTERVENTIONS - Wound Cleansing / Measurement X - Simple Wound Cleansing - one wound 1 5 []  - 0 Complex Wound  Cleansing - multiple wounds X- 1 5 Wound Imaging (photographs - any number of wounds) []  - 0 Wound Tracing (instead of photographs) X- 1  5 Simple Wound Measurement - one wound []  - 0 Complex Wound Measurement - multiple wounds INTERVENTIONS - Wound Dressings X - Small Wound Dressing one or multiple wounds 1 10 []  - 0 Medium Wound Dressing one or multiple wounds []  - 0 Large Wound Dressing one or multiple wounds []  - 0 Application of Medications - topical []  - 0 Application of Medications - injection INTERVENTIONS - Miscellaneous []  - External ear exam 0 []  - 0 Specimen Collection (cultures, biopsies, blood, body fluids, etc.) []  - 0 Specimen(s) / Culture(s) sent or taken to Lab for analysis []  - 0 Patient Transfer (multiple staff / Nurse, adultHoyer Lift / Similar devices) []  - 0 Simple Staple / Suture removal (25 or less) []  - 0 Complex Staple / Suture removal (26 or more) []  - 0 Hypo / Hyperglycemic Management (close monitor of Blood Glucose) []  - 0 Ankle / Brachial Index (ABI) - do not check if billed separately X- 1 5 Vital Signs [name], [name] ([id number]) Has the patient been seen at the hospital within the last three years: Yes Total Score: 80 Level Of Care: New/Established - Level 3 Electronic Signature(s) Signed: 07/18/2017 5:10:34 PM By: [name] Entered By: [name] on 07/18/2017 16:59:32 [name], [name] ([id number]) -------------------------------------------------------------------------------- Encounter Discharge Information Details Patient Name: [name], [name] Date of Service: 07/13/2017 11:00 AM Medical Record Number: [id number] Patient Account Number: [id number] Date of Birth/Sex: 1920-09-02 (83 y.o. Female) Treating RN: [name] Primary Care Casara Perrier: [name], DENNY Other Clinician: Referring Remee Charley: [name], DENNY Treating Johnothan Bascomb/Extender: [name] in [name]: 14 Encounter Discharge Information Items Discharge Pain Level:  0 Discharge Condition: Stable Ambulatory Status: Wheelchair Discharge Destination: Home Transportation: Private Auto Accompanied By: caregiver Schedule Follow-up Appointment: Yes Medication Reconciliation completed and No provided to Patient/Care Jersey Espinoza: Provided on Clinical Summary of Care: 07/13/2017 Form Type Recipient Paper [facility] Signature(s) Signed: 07/18/2017 5:00:27 PM By: [name] Previous Signature: 07/16/2017 9:52:38 AM Version By: Gwenlyn PerkingMoore, Shelia Entered By: [name] on 07/18/2017 17:00:27 Ginger, [name] ([id number]) -------------------------------------------------------------------------------- Lower Extremity Assessment Details Patient Name: Mcclenney, Jocelynne L. Date of Service: 07/13/2017 11:00 AM Medical Record Number: [id number] Patient Account Number: [id number] Date of Birth/Sex: 1920-09-02 (83 y.o. Female) Treating RN: [name] Primary Care Naylin Burkle: [name], DENNY Other Clinician: Referring Merlin Ege: [name], DENNY Treating [name]/Extender: [name] in [name]: 14 Vascular Assessment Pulses: Dorsalis Pedis Palpable: [Right:Yes] Posterior Tibial Extremity colors, hair growth, and conditions: Extremity Color: [Right:Hyperpigmented] Hair Growth on Extremity: [Right:No] Temperature of Extremity: [Right:Warm] Capillary Refill: [Right:< 3 seconds] Electronic Signature(s) Signed: 07/13/2017 4:57:04 PM By: [name] Entered By: [name] on 07/13/2017 11:12:36 Blankenbaker, [name] ([id number]) -------------------------------------------------------------------------------- Multi Wound Chart Details Patient Name: Gravelle, Acquanetta L. Date of Service: 07/13/2017 11:00 AM Medical Record Number: [id number] Patient Account Number: [id number] Date of Birth/Sex: 1920-09-02 (83 y.o. Female) Treating RN: [name] Primary Care Gus Littler: [name], DENNY Other Clinician: Referring Tomorrow Dehaas: [name], DENNY Treating Scout Gumbs/Extender:  [name] in [name]: 14 Vital Signs Height(in): Pulse(bpm): 62 Weight(lbs): Blood Pressure(mmHg): 132/70 Body Mass Index(BMI): Temperature(F): 98.4 Respiratory Rate 16 (breaths/min): Photos: [N/A:N/A] Wound Location: Right Toe Second N/A N/A Wounding Event: Gradually Appeared N/A N/A Primary Etiology: Arterial Insufficiency Ulcer N/A N/A Comorbid History: Hypertension, Gout, N/A N/A Osteoarthritis Date Acquired: 12/26/2016 N/A N/A [name] of [name]: 14 N/A N/A Wound Status: Open N/A N/A Pending Amputation on Yes N/A N/A Presentation: Measurements L x W x D 0.5x0.6x0.3 N/A N/A (cm) Area (cm) : 0.236 N/A N/A Volume (cm) : 0.071 N/A  N/A % Reduction in Area: -151.10% N/A N/A % Reduction in Volume: -688.90% N/A N/A Classification: Full Thickness With Exposed N/A N/A Support Structures Exudate Amount: Medium N/A N/A Exudate Type: Purulent N/A N/A Exudate Color: yellow, brown, green N/A N/A Wound Margin: Flat and Intact N/A N/A Granulation Amount: Large (67-100%) N/A N/A Granulation Quality: Pink N/A N/A Necrotic Amount: Small (1-33%) N/A N/A Exposed Structures: Fat Layer (Subcutaneous N/A N/A Tissue) Exposed: Yes Bone: Yes Fascia: No Lowden, [name] ([id number]) Tendon: No Muscle: No Joint: No Epithelialization: None N/A N/A Periwound Skin Texture: Excoriation: No N/A N/A Induration: No Callus: No Crepitus: No Rash: No Scarring: No Periwound Skin Moisture: Maceration: No N/A N/A Dry/Scaly: No Periwound Skin Color: Atrophie Blanche: No N/A N/A Cyanosis: No Ecchymosis: No Erythema: No Hemosiderin Staining: No Mottled: No Pallor: No Rubor: No Temperature: No Abnormality N/A N/A Tenderness on Palpation: Yes N/A N/A Wound Preparation: Ulcer Cleansing: N/A N/A Rinsed/Irrigated with Saline Topical Anesthetic Applied: Other: lidocaine 4% [name] Notes Electronic Signature(s) Signed: 07/13/2017 11:47:38 AM By: Evlyn Kanner MD,  FACS Entered By: Evlyn Kanner on 07/13/2017 11:47:38 Mihalic, [name] ([id number]) -------------------------------------------------------------------------------- Multi-Disciplinary Care Plan Details Patient Name: Croft, [name] Date of Service: 07/13/2017 11:00 AM Medical Record Number: [id number] Patient Account Number: [id number] Date of Birth/Sex: 1920/09/18 (83 y.o. Female) Treating RN: [name] Sites Primary Care Keionna Kinnaird: [name] Oats Other Clinician: Referring Abednego Yeates: [name] Oats Treating Aija Scarfo/Extender: [name] Re in [name]: 14 Active Inactive ` Abuse / Safety / Falls / Self Care Management Nursing Diagnoses: Impaired physical mobility Goals: Patient will remain injury free related to falls Date Initiated: 04/06/2017 Target Resolution Date: 06/22/2017 Goal Status: Active Interventions: Assess fall risk on admission and as needed Notes: ` Orientation to the Wound Care Program Nursing Diagnoses: Knowledge deficit related to the wound healing center program Goals: Patient/caregiver will verbalize understanding of the Wound Healing Center Program Date Initiated: 04/06/2017 Target Resolution Date: 06/22/2017 Goal Status: Active Interventions: Provide education on orientation to the wound center Notes: ` Wound/Skin Impairment Nursing Diagnoses: Impaired tissue integrity Goals: Ulcer/skin breakdown will heal within 14 [name] Date Initiated: 04/06/2017 Target Resolution Date: 06/22/2017 Goal Status: Active Interventions: RIO, TABER ([id number]) Assess patient/caregiver ability to obtain necessary supplies Assess patient/caregiver ability to perform ulcer/skin care regimen upon admission and as needed Assess ulceration(s) every visit Notes: Electronic Signature(s) Signed: 07/13/2017 4:57:04 PM By: [name] Sites Entered By: [name] Sites on 07/13/2017 11:23:49 Wauters, [name]  ([id number]) -------------------------------------------------------------------------------- Pain Assessment Details Patient Name: Robinson, [name] Date of Service: 07/13/2017 11:00 AM Medical Record Number: [id number] Patient Account Number: [id number] Date of Birth/Sex: Oct 28, 1920 (83 y.o. Female) Treating RN: [name] Sites Primary Care Quashaun Lazalde: [name] Oats Other Clinician: Referring Ayden Hardwick: [name] Oats Treating Maddon Horton/Extender: [name] Re in [name]: 14 Active Problems Location of Pain Severity and Description of Pain Patient Has Paino No Site Locations Pain Management and Medication Current Pain Management: Notes Topical or injectable lidocaine is offered to patient for acute pain when surgical debridement is performed. If needed, Patient is instructed to use over the counter pain medication for the following 24-48 hours after debridement. Wound care MDs do not prescribed pain medications. Patient has chronic pain or uncontrolled pain. Patient has been instructed to make an appointment with their Primary Care Physician for pain management. Electronic Signature(s) Signed: 07/13/2017 4:57:04 PM By: [name] Sites Entered By: [name] Sites on 07/13/2017 11:06:55 Raspberry, [name] ([id number]) -------------------------------------------------------------------------------- Patient/Caregiver Education Details Patient Name: [name] Fleet. Date of Service: 07/13/2017 11:00 AM Medical Record Number:  [id number] Patient Account Number: [id number] Date of Birth/Gender: Oct 02, 1920 (83 y.o. Female) Treating RN: [name] Sites Primary Care Physician: [name] Oats Other Clinician: Referring Physician: Dewaine Oats Treating Physician/Extender: [name] Re in [name]: 14 Education Assessment Education Provided To: Caregiver Education Topics Provided Wound/Skin Impairment: Handouts: Other: wound care to continue as ordered Methods: Demonstration,  Explain/Verbal Responses: State content correctly Electronic Signature(s) Signed: 07/18/2017 5:10:34 PM By: [name] Sites Entered By: [name] Sites on 07/18/2017 17:00:46 [name], [name] ([id number]) -------------------------------------------------------------------------------- Wound Assessment Details Patient Name: Wolfson, [name] Date of Service: 07/13/2017 11:00 AM Medical Record Number: [id number] Patient Account Number: [id number] Date of Birth/Sex: Aug 29, 1920 (83 y.o. Female) Treating RN: [name] Sites Primary Care Blanch Stang: [name] Oats Other Clinician: Referring Sausha Raymond: [name] Oats Treating Jaymarion Trombly/Extender: [name] Re in [name]: 14 Wound Status Wound Number: 1 Primary Etiology: Arterial Insufficiency Ulcer Wound Location: Right Toe Second Wound Status: Open Wounding Event: Gradually Appeared Comorbid History: Hypertension, Gout, Osteoarthritis Date Acquired: 12/26/2016 [name]: 14 Clustered Wound: No Pending Amputation On Presentation Photos Photo Uploaded By: [name] Sites on 07/13/2017 11:17:19 Wound Measurements Length: (cm) 0.5 Width: (cm) 0.6 Depth: (cm) 0.3 Area: (cm) 0.236 Volume: (cm) 0.071 % Reduction in Area: -151.1% % Reduction in Volume: -688.9% Epithelialization: None Tunneling: No Undermining: No Wound Description Full Thickness With Exposed Support Classification: Structures Wound Margin: Flat and Intact Exudate Medium Amount: Exudate Type: Purulent Exudate Color: yellow, brown, green Foul Odor After Cleansing: No Slough/Fibrino Yes Wound Bed Granulation Amount: Large (67-100%) Exposed Structure Granulation Quality: Pink Fascia Exposed: No Necrotic Amount: Small (1-33%) Fat Layer (Subcutaneous Tissue) Exposed: Yes Necrotic Quality: Adherent Slough Tendon Exposed: No Muscle Exposed: No Joint Exposed: No Kratz, [name] ([id number]) Bone Exposed: Yes Periwound Skin Texture Texture Color No  Abnormalities Noted: No No Abnormalities Noted: No Callus: No Atrophie Blanche: No Crepitus: No Cyanosis: No Excoriation: No Ecchymosis: No Induration: No Erythema: No Rash: No Hemosiderin Staining: No Scarring: No Mottled: No Pallor: No Moisture Rubor: No No Abnormalities Noted: No Dry / Scaly: No Temperature / Pain Maceration: No Temperature: No Abnormality Tenderness on Palpation: Yes Wound Preparation Ulcer Cleansing: Rinsed/Irrigated with Saline Topical Anesthetic Applied: Other: lidocaine 4%, [name] Notes Wound #1 (Right Toe Second) 1. Cleansed with: Clean wound with Normal Saline 4. Dressing Applied: Other dressing (specify in notes) 5. Secondary Dressing Applied Kerlix/Conform 7. Secured with Tape Notes silvercel Electronic Signature(s) Signed: 07/13/2017 4:57:04 PM By: [name] Sites Entered By: [name] Sites on 07/13/2017 11:12:02 Doble, [name] Elbert Ewings ([id number]) -------------------------------------------------------------------------------- Vitals Details Patient Name: Mcmorris, Murrell L. Date of Service: 07/13/2017 11:00 AM Medical Record Number: [id number] Patient Account Number: [id number] Date of Birth/Sex: [birth year] (83 y.o. Female) Treating RN: [name] Sites Primary Care Dontez Hauss: TATE, Katherina Right Other Clinician: Referring Zeina Akkerman: [name] Oats Treating Christyna Letendre/Extender: [name] Re in [name]: 14 Vital Signs Time Taken: 11:06 Temperature (F): 98.4 Pulse (bpm): 62 Respiratory Rate (breaths/min): 16 Blood Pressure (mmHg): 132/70 Reference Range: 80 - 120 mg / dl Electronic Signature(s) Signed: 07/13/2017 4:57:04 PM By: [name] Sites Entered By: [name] Sites on 07/13/2017 11:08:39

## 2017-07-27 ENCOUNTER — Encounter: Payer: Medicare Other | Attending: Physician Assistant | Admitting: Physician Assistant

## 2017-07-27 DIAGNOSIS — F0390 Unspecified dementia without behavioral disturbance: Secondary | ICD-10-CM | POA: Insufficient documentation

## 2017-07-27 DIAGNOSIS — M1990 Unspecified osteoarthritis, unspecified site: Secondary | ICD-10-CM | POA: Insufficient documentation

## 2017-07-27 DIAGNOSIS — I739 Peripheral vascular disease, unspecified: Secondary | ICD-10-CM | POA: Insufficient documentation

## 2017-07-27 DIAGNOSIS — L97512 Non-pressure chronic ulcer of other part of right foot with fat layer exposed: Secondary | ICD-10-CM | POA: Diagnosis present

## 2017-07-27 DIAGNOSIS — I10 Essential (primary) hypertension: Secondary | ICD-10-CM | POA: Insufficient documentation

## 2017-07-27 DIAGNOSIS — M109 Gout, unspecified: Secondary | ICD-10-CM | POA: Diagnosis not present

## 2017-07-29 NOTE — Progress Notes (Signed)
[name] [name] ([id number]) Visit Report for 07/27/2017 Arrival Information Details Patient Name: [name] [name]. Date of Service: 07/27/2017 11:00 AM Medical Record Number: [id number] Patient Account Number: [id number] Date of Birth/Sex: 09-25-1920 (84 y.o. Female) Treating RN: Ashok Cordia, Debi Primary Care [name] [name], [facility] Other Clinician: Referring [name] [name], [name] Right Treating Saanvi Hakala/Extender: [name] [name], [name] Weeks in Treatment: 16 Visit Information History Since Last Visit All ordered tests and consults were completed: No Patient Arrived: Wheel Chair Added or deleted any medications: No Arrival Time: 11:22 Any new allergies or adverse reactions: No Accompanied By: caregiver Had a fall or experienced change in No Transfer Assistance: EasyPivot Patient activities of daily living that may affect Lift risk of falls: Patient Identification Verified: Yes Signs or symptoms of abuse/neglect since last visito No Secondary Verification Process Yes Hospitalized since last visit: No Completed: Has Dressing in Place as Prescribed: Yes Patient Requires Transmission-Based No Precautions: Pain Present Now: No Patient Has Alerts: Yes Electronic Signature(s) Signed: 07/27/2017 4:08:09 PM By: [name] [name] Entered By: [name] [name] on 07/27/2017 11:22:32 [name] [name] Churn ([id number]) -------------------------------------------------------------------------------- Clinic Level of Care Assessment Details Patient Name: [name] [name]. Date of Service: 07/27/2017 11:00 AM Medical Record Number: [id number] Patient Account Number: [id number] Date of Birth/Sex: 09/16/1920 (84 y.o. Female) Treating RN: [name] Sites Primary Care Shaletha Humble: [name], [name] Right Other Clinician: Referring Betheny Suchecki: [name], [name] Right Treating Keigan Tafoya/Extender: [name] [name], [name] Weeks in Treatment: 16 Clinic Level of Care Assessment Items TOOL 4 Quantity Score []  - Use when only an EandM is performed on FOLLOW-UP visit  0 ASSESSMENTS - Nursing Assessment / Reassessment X - Reassessment of Co-morbidities (includes updates in patient status) 1 10 X- 1 5 Reassessment of Adherence to Treatment Plan ASSESSMENTS - Wound and Skin Assessment / Reassessment X - Simple Wound Assessment / Reassessment - one wound 1 5 []  - 0 Complex Wound Assessment / Reassessment - multiple wounds []  - 0 Dermatologic / Skin Assessment (not related to wound area) ASSESSMENTS - Focused Assessment []  - Circumferential Edema Measurements - multi extremities 0 []  - 0 Nutritional Assessment / Counseling / Intervention X- 1 5 Lower Extremity Assessment (monofilament, tuning fork, pulses) X- 1 10 Peripheral Arterial Disease Assessment (using hand held doppler) ASSESSMENTS - Ostomy and/or Continence Assessment and Care []  - Incontinence Assessment and Management 0 []  - 0 Ostomy Care Assessment and Management (repouching, etc.) PROCESS - Coordination of Care X - Simple Patient / Family Education for ongoing care 1 15 []  - 0 Complex (extensive) Patient / Family Education for ongoing care []  - 0 Staff obtains Chiropractor, Records, Test Results / Process Orders []  - 0 Staff telephones HHA, Nursing Homes / Clarify orders / etc []  - 0 Routine Transfer to another Facility (non-emergent condition) []  - 0 Routine Hospital Admission (non-emergent condition) []  - 0 New Admissions / Manufacturing engineer / Ordering NPWT, Apligraf, etc. []  - 0 Emergency Hospital Admission (emergent condition) X- 1 10 Simple Discharge Coordination Bittman, Pluma L. ([id number]) []  - 0 Complex (extensive) Discharge Coordination PROCESS - Special Needs []  - Pediatric / Minor Patient Management 0 []  - 0 Isolation Patient Management []  - 0 Hearing / Language / Visual special needs []  - 0 Assessment of Community assistance (transportation, D/C planning, etc.) []  - 0 Additional assistance / Altered mentation []  - 0 Support Surface(s) Assessment (bed,  cushion, seat, etc.) INTERVENTIONS - Wound Cleansing / Measurement []  - Simple Wound Cleansing - one wound 0 X- 3 5 Complex Wound Cleansing - multiple wounds X- 1 5 Wound Imaging (  photographs - any number of wounds) []  - 0 Wound Tracing (instead of photographs) []  - 0 Simple Wound Measurement - one wound X- 3 5 Complex Wound Measurement - multiple wounds INTERVENTIONS - Wound Dressings X - Small Wound Dressing one or multiple wounds 3 10 []  - 0 Medium Wound Dressing one or multiple wounds []  - 0 Large Wound Dressing one or multiple wounds []  - 0 Application of Medications - topical []  - 0 Application of Medications - injection INTERVENTIONS - Miscellaneous []  - External ear exam 0 []  - 0 Specimen Collection (cultures, biopsies, blood, body fluids, etc.) []  - 0 Specimen(s) / Culture(s) sent or taken to Lab for analysis []  - 0 Patient Transfer (multiple staff / Nurse, adultHoyer Lift / Similar devices) []  - 0 Simple Staple / Suture removal (25 or less) []  - 0 Complex Staple / Suture removal (26 or more) []  - 0 Hypo / Hyperglycemic Management (close monitor of Blood Glucose) []  - 0 Ankle / Brachial Index (ABI) - do not check if billed separately X- 1 5 Vital Signs Akhavan, Mahsa L. ([id number]) Has the patient been seen at the hospital within the last three years: Yes Total Score: 130 Level Of Care: New/Established - Level 4 Electronic Signature(s) Signed: 07/27/2017 4:38:28 PM By: [name] [name] Entered By: [name] [name] on 07/27/2017 13:41:02 [name], [name] L. ([id number]) -------------------------------------------------------------------------------- Complex / Palliative Patient Assessment Details Patient Name: [name], [name] L. Date of Service: 07/27/2017 11:00 AM Medical Record Number: [id number] Patient Account Number: [id number] Date of Birth/Sex: 11/10/1920 (84 y.o. Female) Treating RN: [name] [name] Primary Care Johnice Riebe: Dewaine OatsATE, DENNY Other Clinician: Referring Aylyn Wenzler:  Dewaine OatsATE, DENNY Treating Faryn Sieg/Extender: [name] DibblesSTONE [name], [name] Weeks in Treatment: 16 Palliative Management Criteria Complex Wound Management Criteria The patient, the patient's family or care Rilie Glanz(s), or primary care physician has requested supportive care rather than advanced wound treatment. (Must be documented in physician progress notes.) Care Approach Wound Care Plan: Complex Wound Management Electronic Signature(s) Signed: 07/27/2017 1:40:14 PM By: [name] [name] Signed: 07/29/2017 5:30:41 PM By: Lenda KelpStone [name], Hoyt PA-C Entered By: [name] [name] on 07/27/2017 13:40:14 [name], [name] L. ([id number]) -------------------------------------------------------------------------------- Encounter Discharge Information Details Patient Name: [name], [name] L. Date of Service: 07/27/2017 11:00 AM Medical Record Number: [id number] Patient Account Number: [id number] Date of Birth/Sex: 11/10/1920 (84 y.o. Female) Treating RN: [name] [name] Primary Care Kaida Games: Dewaine OatsATE, DENNY Other Clinician: Referring Faiz Weber: Dewaine OatsATE, DENNY Treating Shirely Toren/Extender: [name] DibblesSTONE [name], [name] Weeks in Treatment: 16 Encounter Discharge Information Items Discharge Pain Level: 0 Discharge Condition: Stable Ambulatory Status: Wheelchair Discharge Destination: Home Transportation: Private Auto Accompanied By: caregiver Schedule Follow-up Appointment: Yes Medication Reconciliation completed and No provided to Patient/Care Kyler Lerette: Provided on Clinical Summary of Care: 07/27/2017 Form Type Recipient Paper Patient [facility] Electronic Signature(s) Signed: 07/27/2017 1:41:49 PM By: [name] [name] Entered By: [name] [name] on 07/27/2017 13:41:48 Gatchel, Cristel L. ([id number]) -------------------------------------------------------------------------------- Lower Extremity Assessment Details Patient Name: [name], [name] L. Date of Service: 07/27/2017 11:00 AM Medical Record Number: [id number] Patient Account Number: [id number] Date of  Birth/Sex: 11/10/1920 (84 y.o. Female) Treating RN: Ashok CordiaPinkerton, Debi Primary Care Deran Barro: [name] Other Clinician: Referring Tiffay Pinette: Dewaine OatsATE, DENNY Treating Miamarie Moll/Extender: STONE [name], [name] Weeks in Treatment: 16 Edema Assessment Assessed: [Left: No] [Right: No] Edema: [Left: N] [Right: o] Vascular Assessment Pulses: Dorsalis Pedis Palpable: [Right:No] Doppler Audible: [Right:Yes] Posterior Tibial Extremity colors, hair growth, and conditions: Extremity Color: [Right:Hyperpigmented] Hair Growth on Extremity: [Right:No] Temperature of Extremity: [Right:Cool] Capillary Refill: [Right:< 3 seconds] Electronic Signature(s) Signed: 07/27/2017 4:08:09 PM By: [name] MullingPinkerton, Debra Entered By: Ashok CordiaPinkerton,  Debra on 07/27/2017 11:33:43 [name], [name] LMarland Kitchen ([id number]) -------------------------------------------------------------------------------- Multi Wound Chart Details Patient Name: [name], [name] L. Date of Service: 07/27/2017 11:00 AM Medical Record Number: [id number] Patient Account Number: [id number] Date of Birth/Sex: 11-20-1920 (84 y.o. Female) Treating RN: Ashok Cordia, Debi Primary Care Tawan Corkern: [name], [facility] Other Clinician: Referring Zhoe Catania: [name], [name] Right Treating Genny Caulder/Extender: STONE [name], [name] Weeks in Treatment: 16 Vital Signs Height(in): Pulse(bpm): 51 Weight(lbs): Blood Pressure(mmHg): 125/66 Body Mass Index(BMI): Temperature(F): 98.3 Respiratory Rate 16 (breaths/min): Photos: [1:No Photos] [2:No Photos] [3:No Photos] Wound Location: [1:Right Toe Second] [2:Right Toe Great] [3:Right Toe Third] Wounding Event: [1:Gradually Appeared] [2:Gradually Appeared] [3:Gradually Appeared] Primary Etiology: [1:Arterial Insufficiency Ulcer] [2: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] Comorbid History: [1:N/A] [2:Hypertension, Gout, Osteoarthritis] [3:Hypertension, Gout, Osteoarthritis] Date Acquired: [1:12/26/2016] [2:07/18/2017] [3:07/18/2017] Weeks of Treatment:  [1:16] [2:0] [3:0] Wound Status: [1:Open] [2:Open] [3:Open] Pending Amputation on [1:Yes] [2:No] [3:No] Presentation: Measurements L x W x D [1:0.5x0.7x0.3] [2:0.7x0.8x0.1] [3:0.4x0.4x0.1] (cm) Area (cm) : [1:0.275] [2:0.44] [3:0.126] Volume (cm) : [1:0.082] [2:0.044] [3:0.013] % Reduction in Area: [1:-192.60%] [2:N/A] [3:N/A] % Reduction in Volume: [1:-811.10%] [2:N/A] [3:N/A] Classification: [1:Full Thickness With Exposed Support Structures] [2:Full Thickness Without Exposed Support Structures] [3:Full Thickness Without Exposed Support Structures] Exudate Amount: [1:N/A] [2:Large] [3:Medium] Exudate Type: [1:N/A] [2:Serous] [3:Serous] Exudate Color: [1:N/A] [2:amber] [3:amber] Wound Margin: [1:N/A] [2:Flat and Intact] [3:Flat and Intact] Granulation Amount: [1:N/A] [2:Small (1-33%)] [3:Medium (34-66%)] Granulation Quality: [1:N/A] [2:Pink] [3:Pink] Necrotic Amount: [1:N/A] [2:Large (67-100%)] [3:Medium (34-66%)] Epithelialization: [1:N/A] [2:None] [3:None] Periwound Skin Texture: [1:No Abnormalities Noted] [2:Excoriation: No Induration: No Callus: No Crepitus: No Rash: No Scarring: No] [3:Excoriation: No Induration: No Callus: No Crepitus: No Rash: No Scarring: No] Periwound Skin Moisture: [1:No Abnormalities Noted] [2:Maceration: No Dry/Scaly: No] [3:Maceration: No Dry/Scaly: No] Periwound Skin Color: [1:No Abnormalities Noted] [2:Atrophie Blanche: No Cyanosis: No] [3:Atrophie Blanche: No Cyanosis: No] Ecchymosis: No Ecchymosis: No Erythema: No Erythema: No Hemosiderin Staining: No Hemosiderin Staining: No Mottled: No Mottled: No Pallor: No Pallor: No Rubor: No Rubor: No Temperature: N/A No Abnormality No Abnormality Tenderness on Palpation: No Yes Yes Wound Preparation: N/A Ulcer Cleansing: Ulcer Cleansing: Rinsed/Irrigated with Saline Rinsed/Irrigated with Saline Topical Anesthetic Applied: Topical Anesthetic Applied: Other: lidocaine 4% Other: lidocaine  4% Treatment Notes Electronic Signature(s) Signed: 07/27/2017 4:08:09 PM By: [name] [name] Entered By: [name] [name] on 07/27/2017 11:51:10 [name], [name] L. ([id number]) -------------------------------------------------------------------------------- Multi-Disciplinary Care Plan Details Patient Name: Paolucci, Yudit L. Date of Service: 07/27/2017 11:00 AM Medical Record Number: [id number] Patient Account Number: [id number] Date of Birth/Sex: 07-21-1920 (84 y.o. Female) Treating RN: Ashok Cordia, Debi Primary Care Zafar Debrosse: [name], [facility] Other Clinician: Referring Braylee Lal: Dewaine Oats Treating Mirielle Byrum/Extender: [name] [name], [name] Weeks in Treatment: 16 Active Inactive ` Abuse / Safety / Falls / Self Care Management Nursing Diagnoses: Impaired physical mobility Goals: Patient will remain injury free related to falls Date Initiated: 04/06/2017 Target Resolution Date: 06/22/2017 Goal Status: Active Interventions: Assess fall risk on admission and as needed Notes: ` Orientation to the Wound Care Program Nursing Diagnoses: Knowledge deficit related to the wound healing center program Goals: Patient/caregiver will verbalize understanding of the Wound Healing Center Program Date Initiated: 04/06/2017 Target Resolution Date: 06/22/2017 Goal Status: Active Interventions: Provide education on orientation to the wound center Notes: ` Wound/Skin Impairment Nursing Diagnoses: Impaired tissue integrity Goals: Ulcer/skin breakdown will heal within 14 weeks Date Initiated: 04/06/2017 Target Resolution Date: 06/22/2017 Goal Status: Active Interventions: FEMALE, IAFRATE ([id number]) Assess patient/caregiver ability to obtain necessary supplies Assess patient/caregiver ability to perform ulcer/skin care regimen upon admission and as needed  Assess ulceration(s) every visit Notes: Electronic Signature(s) Signed: 07/27/2017 4:08:09 PM By: [name] [name] Entered By: [name] [name] on 07/27/2017  11:50:59 Lapinsky, Janara L. ([id number]) -------------------------------------------------------------------------------- Pain Assessment Details Patient Name: Rolin, Zannah L. Date of Service: 07/27/2017 11:00 AM Medical Record Number: [id number] Patient Account Number: [id number] Date of Birth/Sex: 1920-11-25 (84 y.o. Female) Treating RN: Ashok Cordia, Debi Primary Care Kaleiah Kutzer: [name], [facility] Other Clinician: Referring Tanicia Wolaver: Dewaine Oats Treating Bona Hubbard/Extender: STONE [name], [name] Weeks in Treatment: 16 Active Problems Location of Pain Severity and Description of Pain Patient Has Paino No Site Locations Pain Management and Medication Current Pain Management: Electronic Signature(s) Signed: 07/27/2017 4:08:09 PM By: [name] [name] Entered By: [name] [name] on 07/27/2017 11:22:37 [name], [name] Churn ([id number]) -------------------------------------------------------------------------------- Patient/Caregiver Education Details Patient Name: [name] [name]. Date of Service: 07/27/2017 11:00 AM Medical Record Number: [id number] Patient Account Number: [id number] Date of Birth/Gender: Feb 13, 1921 (84 y.o. Female) Treating RN: [name] Sites Primary Care Physician: Dewaine Oats Other Clinician: Referring Physician: Dewaine Oats Treating Physician/Extender: Skeet Simmer in Treatment: 16 Education Assessment Education Provided To: Caregiver Education Topics Provided Wound/Skin Impairment: Handouts: Other: wound care as ordered Methods: Demonstration, Explain/Verbal Responses: State content correctly Electronic Signature(s) Signed: 07/27/2017 4:38:28 PM By: [name] Sites Entered By: [name] Sites on 07/27/2017 13:42:05 Luna, Mitsuye L. ([id number]) -------------------------------------------------------------------------------- Wound Assessment Details Patient Name: Vohra, Kristyl L. Date of Service: 07/27/2017 11:00 AM Medical Record Number: [id number] Patient Account Number:  [id number] Date of Birth/Sex: Jan 08, 1921 (84 y.o. Female) Treating RN: Ashok Cordia, Debi Primary Care Javious Hallisey: [name], [facility] Other Clinician: Referring Faryn Sieg: [name], [name] Right Treating Corene Resnick/Extender: STONE [name], [name] Weeks in Treatment: 16 Wound Status Wound Number: 1 Primary Etiology: Arterial Insufficiency Ulcer Wound Location: Right Toe Second Wound Status: Open Wounding Event: Gradually Appeared Date Acquired: 12/26/2016 Weeks Of Treatment: 16 Clustered Wound: No Pending Amputation On Presentation Photos Photo Uploaded By: [name] Sites on 07/27/2017 13:47:32 Wound Measurements Length: (cm) 0.5 Width: (cm) 0.7 Depth: (cm) 0.3 Area: (cm) 0.275 Volume: (cm) 0.082 % Reduction in Area: -192.6% % Reduction in Volume: -811.1% Wound Description Full Thickness With Exposed Support Classification: Structures Periwound Skin Texture Texture Color No Abnormalities Noted: No No Abnormalities Noted: No Moisture No Abnormalities Noted: No Treatment Notes Wound #1 (Right Toe Second) 1. Cleansed with: Clean wound with Normal Saline 4. Dressing Applied: NOLIA, TSCHANTZ. ([id number]) Other dressing (specify in notes) 5. Secondary Dressing Applied Kerlix/Conform 7. Secured with Tape Notes silvercel Electronic Signature(s) Signed: 07/27/2017 4:08:09 PM By: [name] [name] Entered By: [name] [name] on 07/27/2017 11:29:36 Sweaney, Jakyria L. ([id number]) -------------------------------------------------------------------------------- Wound Assessment Details Patient Name: Hornback, Lindsy L. Date of Service: 07/27/2017 11:00 AM Medical Record Number: [id number] Patient Account Number: [id number] Date of Birth/Sex: 1921-04-27 (84 y.o. Female) Treating RN: Ashok Cordia, Debi Primary Care Royce Sciara: [name], [facility] Other Clinician: Referring Hilbert Briggs: Dewaine Oats Treating Arissa Fagin/Extender: STONE [name], [name] Weeks in Treatment: 16 Wound Status Wound Number: 2 Primary Etiology: Arterial  Insufficiency Ulcer Wound Location: Right Toe Great Wound Status: Open Wounding Event: Gradually Appeared Comorbid History: Hypertension, Gout, Osteoarthritis Date Acquired: 07/18/2017 Weeks Of Treatment: 0 Clustered Wound: No Photos Photo Uploaded By: [name] Sites on 07/27/2017 13:47:33 Wound Measurements Length: (cm) 0.7 Width: (cm) 0.8 Depth: (cm) 0.1 Area: (cm) 0.44 Volume: (cm) 0.044 % Reduction in Area: % Reduction in Volume: Epithelialization: None Tunneling: No Undermining: No Wound Description Full Thickness Without Exposed Support Classification: Structures Wound Margin: Flat and Intact Exudate Large Amount: Exudate Type: Serous Exudate Color: amber Foul Odor After Cleansing: No Slough/Fibrino Yes Wound Bed Granulation Amount: Small (  1-33%) Exposed Structure Granulation Quality: Pink Fascia Exposed: No Necrotic Amount: Large (67-100%) Fat Layer (Subcutaneous Tissue) Exposed: No Necrotic Quality: Adherent Slough Tendon Exposed: No Muscle Exposed: No Joint Exposed: No Bone Exposed: No [name], [name] L. ([id number]) Periwound Skin Texture Texture Color No Abnormalities Noted: No No Abnormalities Noted: No Callus: No Atrophie Blanche: No Crepitus: No Cyanosis: No Excoriation: No Ecchymosis: No Induration: No Erythema: No Rash: No Hemosiderin Staining: No Scarring: No Mottled: No Pallor: No Moisture Rubor: No No Abnormalities Noted: No Dry / Scaly: No Temperature / Pain Maceration: No Temperature: No Abnormality Tenderness on Palpation: Yes Wound Preparation Ulcer Cleansing: Rinsed/Irrigated with Saline Topical Anesthetic Applied: Other: lidocaine 4%, Treatment Notes Wound #2 (Right Toe Great) 1. Cleansed with: Clean wound with Normal Saline 4. Dressing Applied: Other dressing (specify in notes) 5. Secondary Dressing Applied Kerlix/Conform 7. Secured with Tape Notes silvercel Electronic Signature(s) Signed: 07/27/2017 4:08:09  PM By: [name] [name] Entered By: [name] [name] on 07/27/2017 11:30:34 Mincy, [name] LMarland Kitchen ([id number]) -------------------------------------------------------------------------------- Wound Assessment Details Patient Name: Rone, Ellajane L. Date of Service: 07/27/2017 11:00 AM Medical Record Number: [id number] Patient Account Number: [id number] Date of Birth/Sex: 08-12-1920 (84 y.o. Female) Treating RN: Ashok Cordia, Debi Primary Care Daylon Lafavor: [name], [facility] Other Clinician: Referring Wellington Winegarden: [name], [name] Right Treating Kyrstyn Greear/Extender: STONE [name], [name] Weeks in Treatment: 16 Wound Status Wound Number: 3 Primary Etiology: Arterial Insufficiency Ulcer Wound Location: Right Toe Third Wound Status: Open Wounding Event: Gradually Appeared Comorbid History: Hypertension, Gout, Osteoarthritis Date Acquired: 07/18/2017 Weeks Of Treatment: 0 Clustered Wound: No Photos Photo Uploaded By: [name] Sites on 07/27/2017 13:47:57 Wound Measurements Length: (cm) 0.4 Width: (cm) 0.4 Depth: (cm) 0.1 Area: (cm) 0.126 Volume: (cm) 0.013 % Reduction in Area: % Reduction in Volume: Epithelialization: None Tunneling: No Undermining: No Wound Description Full Thickness Without Exposed Support Classification: Structures Wound Margin: Flat and Intact Exudate Medium Amount: Exudate Type: Serous Exudate Color: amber Foul Odor After Cleansing: No Slough/Fibrino Yes Wound Bed Granulation Amount: Medium (34-66%) Exposed Structure Granulation Quality: Pink Fascia Exposed: No Necrotic Amount: Medium (34-66%) Fat Layer (Subcutaneous Tissue) Exposed: No Necrotic Quality: Adherent Slough Tendon Exposed: No Muscle Exposed: No Joint Exposed: No Bone Exposed: No Sivils, Margert L. ([id number]) Periwound Skin Texture Texture Color No Abnormalities Noted: No No Abnormalities Noted: No Callus: No Atrophie Blanche: No Crepitus: No Cyanosis: No Excoriation: No Ecchymosis: No Induration: No Erythema:  No Rash: No Hemosiderin Staining: No Scarring: No Mottled: No Pallor: No Moisture Rubor: No No Abnormalities Noted: No Dry / Scaly: No Temperature / Pain Maceration: No Temperature: No Abnormality Tenderness on Palpation: Yes Wound Preparation Ulcer Cleansing: Rinsed/Irrigated with Saline Topical Anesthetic Applied: Other: lidocaine 4%, Treatment Notes Wound #3 (Right Toe Third) 1. Cleansed with: Clean wound with Normal Saline 4. Dressing Applied: Other dressing (specify in notes) 5. Secondary Dressing Applied Kerlix/Conform 7. Secured with Tape Notes silvercel Electronic Signature(s) Signed: 07/27/2017 4:08:09 PM By: [name] [name] Entered By: [name] [name] on 07/27/2017 11:32:22 [name], [name] Churn ([id number]) -------------------------------------------------------------------------------- Vitals Details Patient Name: Spinola, Arieanna L. Date of Service: 07/27/2017 11:00 AM Medical Record Number: [id number] Patient Account Number: [id number] Date of Birth/Sex: 10-Jan-1921 (84 y.o. Female) Treating RN: Ashok Cordia, Debi Primary Care Andrea Ferrer: [name], [facility] Other Clinician: Referring Terez Montee: [name], [name] Right Treating Payslee Bateson/Extender: STONE [name], [name] Weeks in Treatment: 16 Vital Signs Time Taken: 11:22 Temperature (F): 98.3 Pulse (bpm): 51 Respiratory Rate (breaths/min): 16 Blood Pressure (mmHg): 125/66 Reference Range: 80 - 120 mg / dl Electronic Signature(s) Signed: 07/27/2017 4:08:09 PM By: [name] [name] Entered By: Ashok Cordia,  Debra on 07/27/2017 11:24:48

## 2017-07-29 NOTE — Progress Notes (Signed)
Gary FleetHARDY, Jyllian L. ([id number]) Visit Report for 07/27/2017 Chief Complaint Document Details Patient Name: [name], [name] Date of Service: 07/27/2017 11:00 AM Medical Record Number: [id number] Patient Account Number: [id number] Date of Birth/Sex: Jan 16, 1921 (84 y.o. Female) Treating RN: Curtis Sitesorthy, Joanna Primary Care Provider: Dewaine OatsATE, DENNY Other Clinician: Referring Provider: Dewaine OatsATE, DENNY Treating Provider/Extender: Linwood DibblesSTONE III, Dinh Ayotte Weeks in Treatment: 16 Information Obtained from: Patient Chief Complaint Patient seen for complaints of Non-Healing Wound to the right second toe Electronic Signature(s) Signed: 07/29/2017 5:30:41 PM By: Lenda KelpStone III, Tareq Dwan PA-C Entered By: Lenda KelpStone III, Lanae Federer on 07/27/2017 11:43:01 Maffei, Shley L. ([id number]) -------------------------------------------------------------------------------- HPI Details Patient Name: [name] BayleyHARDY, [name] Date of Service: 07/27/2017 11:00 AM Medical Record Number: [id number] Patient Account Number: [id number] Date of Birth/Sex: Jan 16, 1921 (84 y.o. Female) Treating RN: Curtis Sitesorthy, Joanna Primary Care Provider: Dewaine OatsATE, DENNY Other Clinician: Referring Provider: Dewaine OatsATE, DENNY Treating Provider/Extender: STONE III, Shirrell Solinger Weeks in Treatment: 16 History of Present Illness HPI Description: very pleasant 84 year old patient, no history of diabetes or smoking has had a ulceration on the right second toe dorsum for about 3 months. Besides essential hypertension and gout she does not have any other significant problems and has never been a smoker. 04/13/17 on evaluation today patient appears to be doing about the same in regard to her right second toe ulcer. We did get the results for x-ray which showed osteopenia without any specific bone abnormality osteomyelitis. She does continue to have discomfort with palpation of the wound although secondary to mental status is unable to rate or describe this pain. No fevers, chills, nausea, or vomiting noted at this  time. 04/27/2017 -- over the last couple of weeks the patient has had some issues with a urinary tract infection and some mental status changes and after review today, I have recommended we do not do the arterial studies recommended by my colleague last week, in view of the fact that the patient's wound has improved considerably and I do not want to put her through further investigations, which may not change the treatment plan. 05/04/2017 -- the patient was seen by her podiatrist and also has a CT scan pending to workup her recent mental status changes. 05/13/17 on evaluation today patient appears to be doing about the same. The wound is not significantly smaller although there is more granulation than when I last saw this. We are continuing to manage this more conservatively as patient's family does not want to delve deeper into a lot of testing they would prefer to see how the wound progresses. Fortunately there is no evidence of infection. No fevers, chills, nausea, or vomiting noted at this time. She has been tolerating the dressing changes without complication and has only minimal discomfort that she is unable to rate or describe her pain secondary to mental status. 06/15/2017 -- 84 year old patient with significant dementia is being very well taken care of by her caregivers but the big plan is for palliative care only and no aggressive treatment is to be done. 06/29/2017 -- the caregiver confirms that the family and the power of attorney did not want any aggressive treatment to be done and we will continue with palliative care. She is a bit brighter today and seems to be more oriented in time and place. 07/13/2017 -- 84 year old with a open wound on the right second toe dorsum probing down to bone has had a stable toe without any evidence of cellulitis and the power of attorney does not want any aggressive treatment to be done unless absolutely  essential. We have been giving her palliative  care for a while and today she is very awake and alert and seems well oriented in time and space. 07/27/17 on evaluation today patient unfortunately has two new ulcers one over the medial fourth toe and one on the lateral second toe just adjacent to the main ulcerated lesion. This almost appears to be possibly moisture associated breakdown although they are very circular in nature which does lend itself to an arterial insufficiency ulcer. There does not appear to be any purulent discharge from any site at this point. Patient did see Dr. Alberteen Spindle recently and he has recommended proceeding with the arterial studies at this point so that if any type of amputation is necessary they will know what needs to be done without having to guess or go and blindly. Obviously I think this is a good idea as it's possible that amputation may become necessary. He also placed her on Augmentin for six weeks. Electronic Signature(s) Signed: 07/29/2017 5:30:41 PM By: Lenda Kelp PA-C Entered By: Lenda Kelp on 07/27/2017 12:01:13 Pusey, [name] ([id number]) Boswell, [name] ([id number]) -------------------------------------------------------------------------------- Physical Exam Details Patient Name: [name], [name] Date of Service: 07/27/2017 11:00 AM Medical Record Number: [id number] Patient Account Number: [id number] Date of Birth/Sex: September 03, 1920 (84 y.o. Female) Treating RN: Curtis Sites Primary Care Provider: Dewaine Oats Other Clinician: Referring Provider: TATE, Katherina Right Treating Provider/Extender: STONE III, Magdiel Bartles Weeks in Treatment: 16 Constitutional Thin and well-hydrated in no acute distress. Respiratory normal breathing without difficulty. clear to auscultation bilaterally. Cardiovascular regular rate and rhythm with normal S1, S2. Absent posterior tibial and dorsalis pedis pulses bilateral lower extremities. Psychiatric Patient is not able to cooperate in decision making regarding care. Patient  has dementia. patient is confused. Notes Patient's wound actually appears to be unchanged as best I can tell even from when I first saw it. Other than the fact that she has the two new ulcers on the adjacent toes I really would not have noted any significant worsening overall. Electronic Signature(s) Signed: 07/29/2017 5:30:41 PM By: Lenda Kelp PA-C Entered By: Lenda Kelp on 07/27/2017 12:02:40 Wickstrom, [name] ([id number]) -------------------------------------------------------------------------------- Physician Orders Details Patient Name: [name]. Date of Service: 07/27/2017 11:00 AM Medical Record Number: [id number] Patient Account Number: [id number] Date of Birth/Sex: 1920-11-09 (84 y.o. Female) Treating RN: Curtis Sites Primary Care Provider: Dewaine Oats Other Clinician: Referring Provider: Dewaine Oats Treating Provider/Extender: Linwood Dibbles, Ricketta Colantonio Weeks in Treatment: 16 Verbal / Phone Orders: No Diagnosis Coding ICD-10 Coding Code Description L97.512 Non-pressure chronic ulcer of other part of right foot with fat layer exposed I73.9 Peripheral vascular disease, unspecified I10 Essential (primary) hypertension Wound Cleansing Wound #1 Right Toe Second o Clean wound with Normal Saline. Wound #2 Right Toe Great o Clean wound with Normal Saline. Wound #3 Right Toe Third o Clean wound with Normal Saline. Primary Wound Dressing Wound #1 Right Toe Second o Silvercel Non-Adherent Wound #2 Right Toe Great o Silvercel Non-Adherent Wound #3 Right Toe Third o Silvercel Non-Adherent Secondary Dressing Wound #1 Right Toe Second o Gauze and Kerlix/Conform - or bandaid or coverlet Wound #2 Right Toe Great o Gauze and Kerlix/Conform - or bandaid or coverlet Wound #3 Right Toe Third o Gauze and Kerlix/Conform - or bandaid or coverlet Dressing Change Frequency Wound #1 Right Toe Second o Other: - change the bandage on shower days or if soiled Wound  #2 Right Toe Great o Other: - change the bandage on shower days or  if soiled Germani, Elizabella L. ([id number]) Wound #3 Right Toe Third o Other: - change the bandage on shower days or if soiled Follow-up Appointments Wound #1 Right Toe Second o Return Appointment in 2 weeks. Wound #2 Right Toe Great o Return Appointment in 2 weeks. Wound #3 Right Toe Third o Return Appointment in 2 weeks. Off-Loading Wound #1 Right Toe Second o Other: - continue wearing shoes that do not rub the top of the toe Wound #2 Right Toe Great o Other: - continue wearing shoes that do not rub the top of the toe Wound #3 Right Toe Third o Other: - continue wearing shoes that do not rub the top of the toe Additional Orders / Instructions Wound #1 Right Toe Second o Increase protein intake. o Other: - Please add vitamin A, vitamin C and zinc supplements to your diet Wound #2 Right Toe Great o Increase protein intake. o Other: - Please add vitamin A, vitamin C and zinc supplements to your diet Wound #3 Right Toe Third o Increase protein intake. o Other: - Please add vitamin A, vitamin C and zinc supplements to your diet Electronic Signature(s) Signed: 07/27/2017 4:38:28 PM By: Curtis Sites Signed: 07/29/2017 5:30:41 PM By: Lenda Kelp PA-C Entered By: Curtis Sites on 07/27/2017 11:56:05 Goga, Kaeleen L. ([id number]) -------------------------------------------------------------------------------- Problem List Details Patient Name: Remer, Junior L. Date of Service: 07/27/2017 11:00 AM Medical Record Number: [id number] Patient Account Number: [id number] Date of Birth/Sex: 1920/09/02 (84 y.o. Female) Treating RN: Curtis Sites Primary Care Provider: Dewaine Oats Other Clinician: Referring Provider: Dewaine Oats Treating Provider/Extender: Linwood Dibbles, Taeveon Keesling Weeks in Treatment: 16 Active Problems ICD-10 Encounter Code Description Active Date Diagnosis L97.512 Non-pressure chronic  ulcer of other part of right foot with fat layer 04/06/2017 Yes exposed I73.9 Peripheral vascular disease, unspecified 04/06/2017 Yes I10 Essential (primary) hypertension 04/06/2017 Yes Inactive Problems Resolved Problems Electronic Signature(s) Signed: 07/29/2017 5:30:41 PM By: Lenda Kelp PA-C Entered By: Lenda Kelp on 07/27/2017 11:42:52 Thatch, Chenell L. ([id number]) -------------------------------------------------------------------------------- Progress Note Details Patient Name: Severs, Harshitha L. Date of Service: 07/27/2017 11:00 AM Medical Record Number: [id number] Patient Account Number: [id number] Date of Birth/Sex: Jun 16, 1921 (84 y.o. Female) Treating RN: Curtis Sites Primary Care Provider: TATE, Katherina Right Other Clinician: Referring Provider: Dewaine Oats Treating Provider/Extender: Linwood Dibbles, Seniah Lawrence Weeks in Treatment: 16 Subjective Chief Complaint Information obtained from Patient Patient seen for complaints of Non-Healing Wound to the right second toe History of Present Illness (HPI) very pleasant 84 year old patient, no history of diabetes or smoking has had a ulceration on the right second toe dorsum for about 3 months. Besides essential hypertension and gout she does not have any other significant problems and has never been a smoker. 04/13/17 on evaluation today patient appears to be doing about the same in regard to her right second toe ulcer. We did get the results for x-ray which showed osteopenia without any specific bone abnormality osteomyelitis. She does continue to have discomfort with palpation of the wound although secondary to mental status is unable to rate or describe this pain. No fevers, chills, nausea, or vomiting noted at this time. 04/27/2017 -- over the last couple of weeks the patient has had some issues with a urinary tract infection and some mental status changes and after review today, I have recommended we do not do the arterial studies recommended  by my colleague last week, in view of the fact that the patient's wound has improved considerably and I do not want to put her  through further investigations, which may not change the treatment plan. 05/04/2017 -- the patient was seen by her podiatrist and also has a CT scan pending to workup her recent mental status changes. 05/13/17 on evaluation today patient appears to be doing about the same. The wound is not significantly smaller although there is more granulation than when I last saw this. We are continuing to manage this more conservatively as patient's family does not want to delve deeper into a lot of testing they would prefer to see how the wound progresses. Fortunately there is no evidence of infection. No fevers, chills, nausea, or vomiting noted at this time. She has been tolerating the dressing changes without complication and has only minimal discomfort that she is unable to rate or describe her pain secondary to mental status. 06/15/2017 -- 84 year old patient with significant dementia is being very well taken care of by her caregivers but the big plan is for palliative care only and no aggressive treatment is to be done. 06/29/2017 -- the caregiver confirms that the family and the power of attorney did not want any aggressive treatment to be done and we will continue with palliative care. She is a bit brighter today and seems to be more oriented in time and place. 07/13/2017 -- 84 year old with a open wound on the right second toe dorsum probing down to bone has had a stable toe without any evidence of cellulitis and the power of attorney does not want any aggressive treatment to be done unless absolutely essential. We have been giving her palliative care for a while and today she is very awake and alert and seems well oriented in time and space. 07/27/17 on evaluation today patient unfortunately has two new ulcers one over the medial fourth toe and one on the lateral second toe  just adjacent to the main ulcerated lesion. This almost appears to be possibly moisture associated breakdown although they are very circular in nature which does lend itself to an arterial insufficiency ulcer. There does not appear to be any purulent discharge from any site at this point. Patient did see Dr. Alberteen Spindle recently and he has recommended proceeding with the arterial studies at this point so that if any type of amputation is necessary they will know what needs to be done without having to guess or go and blindly. Obviously I think this is a good idea as it's possible that amputation may become Schnapp, Evangelina L. ([id number]) necessary. He also placed her on Augmentin for six weeks. Patient History Information obtained from Patient. Social History Never smoker, Marital Status - Widowed, Alcohol Use - Never, Drug Use - No History, Caffeine Use - Daily. Review of Systems (ROS) Constitutional Symptoms (General Health) Denies complaints or symptoms of Fever, Chills. Respiratory The patient has no complaints or symptoms. Cardiovascular The patient has no complaints or symptoms. Psychiatric The patient has no complaints or symptoms. Objective Constitutional Thin and well-hydrated in no acute distress. Vitals Time Taken: 11:22 AM, Temperature: 98.3 F, Pulse: 51 bpm, Respiratory Rate: 16 breaths/min, Blood Pressure: 125/66 mmHg. Respiratory normal breathing without difficulty. clear to auscultation bilaterally. Cardiovascular regular rate and rhythm with normal S1, S2. Absent posterior tibial and dorsalis pedis pulses bilateral lower extremities. Psychiatric Patient is not able to cooperate in decision making regarding care. Patient has dementia. patient is confused. General Notes: Patient's wound actually appears to be unchanged as best I can tell even from when I first saw it. Other than the fact that she has the two  new ulcers on the adjacent toes I really would not have noted any  significant worsening overall. Integumentary (Hair, Skin) Wound #1 status is Open. Original cause of wound was Gradually Appeared. The wound is located on the Right Toe Second. The wound measures 0.5cm length x 0.7cm width x 0.3cm depth; 0.275cm^2 area and 0.082cm^3 volume. Wound #2 status is Open. Original cause of wound was Gradually Appeared. The wound is located on the Right Toe Great. The wound measures 0.7cm length x 0.8cm width x 0.1cm depth; 0.44cm^2 area and 0.044cm^3 volume. There is no tunneling or undermining noted. There is a large amount of serous drainage noted. The wound margin is flat and intact. There is small (1-33%) pink granulation within the wound bed. There is a large (67-100%) amount of necrotic tissue within the wound bed including Adherent Slough. The periwound skin appearance did not exhibit: Callus, Crepitus, Excoriation, Induration, Rash, Scarring, Dry/Scaly, Maceration, Atrophie Blanche, Cyanosis, Ecchymosis, Hemosiderin Staining, Mottled, Tweed, Anjulie L. ([id number]) Pallor, Rubor, Erythema. Periwound temperature was noted as No Abnormality. The periwound has tenderness on palpation. Wound #3 status is Open. Original cause of wound was Gradually Appeared. The wound is located on the Right Toe Third. The wound measures 0.4cm length x 0.4cm width x 0.1cm depth; 0.126cm^2 area and 0.013cm^3 volume. There is no tunneling or undermining noted. There is a medium amount of serous drainage noted. The wound margin is flat and intact. There is medium (34-66%) pink granulation within the wound bed. There is a medium (34-66%) amount of necrotic tissue within the wound bed including Adherent Slough. The periwound skin appearance did not exhibit: Callus, Crepitus, Excoriation, Induration, Rash, Scarring, Dry/Scaly, Maceration, Atrophie Blanche, Cyanosis, Ecchymosis, Hemosiderin Staining, Mottled, Pallor, Rubor, Erythema. Periwound temperature was noted as No Abnormality. The  periwound has tenderness on palpation. Assessment Active Problems ICD-10 L97.512 - Non-pressure chronic ulcer of other part of right foot with fat layer exposed I73.9 - Peripheral vascular disease, unspecified I10 - Essential (primary) hypertension Plan Wound Cleansing: Wound #1 Right Toe Second: Clean wound with Normal Saline. Wound #2 Right Toe Great: Clean wound with Normal Saline. Wound #3 Right Toe Third: Clean wound with Normal Saline. Primary Wound Dressing: Wound #1 Right Toe Second: Silvercel Non-Adherent Wound #2 Right Toe Great: Silvercel Non-Adherent Wound #3 Right Toe Third: Silvercel Non-Adherent Secondary Dressing: Wound #1 Right Toe Second: Gauze and Kerlix/Conform - or bandaid or coverlet Wound #2 Right Toe Great: Gauze and Kerlix/Conform - or bandaid or coverlet Wound #3 Right Toe Third: Gauze and Kerlix/Conform - or bandaid or coverlet Dressing Change Frequency: Wound #1 Right Toe Second: Other: - change the bandage on shower days or if soiled Wound #2 Right Toe Great: Other: - change the bandage on shower days or if soiled Wound #3 Right Toe Third: Other: - change the bandage on shower days or if soiled Vipond, Adrie L. ([id number]) Follow-up Appointments: Wound #1 Right Toe Second: Return Appointment in 2 weeks. Wound #2 Right Toe Great: Return Appointment in 2 weeks. Wound #3 Right Toe Third: Return Appointment in 2 weeks. Off-Loading: Wound #1 Right Toe Second: Other: - continue wearing shoes that do not rub the top of the toe Wound #2 Right Toe Great: Other: - continue wearing shoes that do not rub the top of the toe Wound #3 Right Toe Third: Other: - continue wearing shoes that do not rub the top of the toe Additional Orders / Instructions: Wound #1 Right Toe Second: Increase protein intake. Other: - Please add vitamin  A, vitamin C and zinc supplements to your diet Wound #2 Right Toe Great: Increase protein intake. Other: - Please add  vitamin A, vitamin C and zinc supplements to your diet Wound #3 Right Toe Third: Increase protein intake. Other: - Please add vitamin A, vitamin C and zinc supplements to your diet I'm going to recommend that we continue with the Current wound care measures specifically the silver alginate dressing for patient's wounds at all three locations. Then hopefully this will help the smaller areas to close by helping to prevent excessive moisture. Otherwise we are going to see her for reevaluation in two weeks time. We will see what the arterial study shows which was ordered by Dr. Alberteen Spindle. Please see above for specific wound care orders. We will see patient for re-evaluation in 1 week(s) here in the clinic. If anything worsens or changes patient will contact our office for additional recommendations. Electronic Signature(s) Signed: 07/29/2017 5:30:41 PM By: Lenda Kelp PA-C Entered By: Lenda Kelp on 07/27/2017 12:07:01 Schwertner, [name] ([id number]) -------------------------------------------------------------------------------- ROS/PFSH Details Patient Name: [name] L. Date of Service: 07/27/2017 11:00 AM Medical Record Number: [id number] Patient Account Number: [id number] Date of Birth/Sex: May 11, 1921 (84 y.o. Female) Treating RN: Curtis Sites Primary Care Provider: TATE, Katherina Right Other Clinician: Referring Provider: Dewaine Oats Treating Provider/Extender: Linwood Dibbles, Abdoul Encinas Weeks in Treatment: 16 Information Obtained From Patient Wound History Do you currently have one or more open woundso Yes How many open wounds do you currently haveo 1 Approximately how long have you had your woundso 3 months How have you been treating your wound(s) until nowo neosporin Has your wound(s) ever healed and then re-openedo No Have you had any lab work done in the past montho No Have you tested positive for an antibiotic resistant organism (MRSA, VRE)o No Have you tested positive for osteomyelitis (bone  infection)o No Have you had any tests for circulation on your legso No Constitutional Symptoms (General Health) Complaints and Symptoms: Negative for: Fever; Chills Respiratory Complaints and Symptoms: No Complaints or Symptoms Cardiovascular Complaints and Symptoms: No Complaints or Symptoms Medical History: Positive for: Hypertension Musculoskeletal Medical History: Positive for: Gout; Osteoarthritis Psychiatric Complaints and Symptoms: No Complaints or Symptoms Immunizations Pneumococcal Vaccine: Received Pneumococcal Vaccination: Yes Immunization Notes: up to date Implantable Devices EVGENIA, MERRIMAN ([id number]) Family and Social History Never smoker; Marital Status - Widowed; Alcohol Use: Never; Drug Use: No History; Caffeine Use: Daily; Financial Concerns: No; Food, Clothing or Shelter Needs: No; Support System Lacking: No; Transportation Concerns: No; Advanced Directives: Yes (Not Provided); Patient does not want information on Advanced Directives; Medical Power of Attorney: Yes - Ardeen Jourdain - niece (Not Provided) Physician Affirmation I have reviewed and agree with the above information. Electronic Signature(s) Signed: 07/27/2017 4:38:28 PM By: Curtis Sites Signed: 07/29/2017 5:30:41 PM By: Lenda Kelp PA-C Entered By: Lenda Kelp on 07/27/2017 12:02:00 Chismar, Tykeshia Elbert Ewings ([id number]) -------------------------------------------------------------------------------- SuperBill Details Patient Name: Selleck, Kritika L. Date of Service: 07/27/2017 Medical Record Number: [id number] Patient Account Number: [id number] Date of Birth/Sex: 10-22-1920 (84 y.o. Female) Treating RN: Curtis Sites Primary Care Provider: Dewaine Oats Other Clinician: Referring Provider: Dewaine Oats Treating Provider/Extender: STONE III, Xzander Gilham Weeks in Treatment: 16 Diagnosis Coding ICD-10 Codes Code Description L97.512 Non-pressure chronic ulcer of other part of right foot with fat layer  exposed I73.9 Peripheral vascular disease, unspecified I10 Essential (primary) hypertension Facility Procedures CPT4 Code: [id number] Description: 99213 - WOUND CARE VISIT-LEV 3 EST PT Modifier: Quantity: 1 Physician Procedures CPT4 Code:  [id number] Description: 99213 - WC PHYS LEVEL 3 - EST PT ICD-10 Diagnosis Description L97.512 Non-pressure chronic ulcer of other part of right foot with fat I73.9 Peripheral vascular disease, unspecified I10 Essential (primary) hypertension Modifier: layer exposed Quantity: 1 Electronic Signature(s) Signed: 07/27/2017 1:41:10 PM By: Curtis Sites Signed: 07/29/2017 5:30:41 PM By: Lenda Kelp PA-C Entered By: Curtis Sites on 07/27/2017 13:41:10

## 2017-08-08 ENCOUNTER — Ambulatory Visit (INDEPENDENT_AMBULATORY_CARE_PROVIDER_SITE_OTHER): Payer: Medicare Other

## 2017-08-08 ENCOUNTER — Ambulatory Visit (INDEPENDENT_AMBULATORY_CARE_PROVIDER_SITE_OTHER): Payer: Medicare Other | Admitting: Vascular Surgery

## 2017-08-08 ENCOUNTER — Other Ambulatory Visit (INDEPENDENT_AMBULATORY_CARE_PROVIDER_SITE_OTHER): Payer: Self-pay | Admitting: Vascular Surgery

## 2017-08-08 ENCOUNTER — Encounter (INDEPENDENT_AMBULATORY_CARE_PROVIDER_SITE_OTHER): Payer: Self-pay | Admitting: Vascular Surgery

## 2017-08-08 VITALS — BP 133/57 | HR 67 | Resp 16 | Ht 60.0 in | Wt 101.6 lb

## 2017-08-08 DIAGNOSIS — L97513 Non-pressure chronic ulcer of other part of right foot with necrosis of muscle: Secondary | ICD-10-CM

## 2017-08-08 DIAGNOSIS — I10 Essential (primary) hypertension: Secondary | ICD-10-CM | POA: Diagnosis not present

## 2017-08-08 DIAGNOSIS — S81809A Unspecified open wound, unspecified lower leg, initial encounter: Secondary | ICD-10-CM

## 2017-08-08 DIAGNOSIS — I739 Peripheral vascular disease, unspecified: Secondary | ICD-10-CM

## 2017-08-08 NOTE — Progress Notes (Signed)
Subjective:    Patient ID: [name], female    DOB: 06/03/1921, 84 y.o.   MRN: [id number] Chief Complaint  Patient presents with  . Follow-up    abi    Presents as a new patient referred by Dr. Alberteen Spindle for evaluation of a nonhealing ulcer and possible peripheral artery disease.  The patient is seen with an aide.  The patient's power of attorney is her niece.  Most of the patient's history is obtained from the aide.  The patient has been receiving services from the wound center for what sounds like approximately 2 months for an ulceration to the right second toe.  The patient recently was seen by Dr. Alberteen Spindle who recommended she will most likely need amputation of the second toe due to the severity of her wound.  At the time of his examination no palpable pedal pulses were noted on exam.  The patient's aide states that she is active and continues to walk briskly.  The patient does not seem to be in much discomfort due to her ulceration.  Patient does not seem to be in any pain with ambulation.  The patient underwent a bilateral lower extremity arterial ABI which was notable for right ABI of 0.31 and left ABI of 0.40.  Bilateral waveforms are practically dampened and flattened.  Right lower extremity with a biphasic peroneal and monophasic tibials.  Left lower extremity with biphasic tibials.  Patient denies any fever, nausea vomiting.   Review of Systems  Constitutional: Negative.   HENT: Negative.   Eyes: Negative.   Respiratory: Negative.   Cardiovascular: Negative.   Gastrointestinal: Negative.   Endocrine: Negative.   Genitourinary: Negative.   Musculoskeletal: Negative.   Skin: Positive for wound.  Allergic/Immunologic: Negative.   Neurological: Negative.   Hematological: Negative.   Psychiatric/Behavioral: Negative.       Objective:   Physical Exam  Constitutional: She is oriented to person, place, and time. She appears well-developed and well-nourished. No distress.  HENT:    Head: Normocephalic and atraumatic.  Eyes: Conjunctivae are normal. Pupils are equal, round, and reactive to light.  Neck: Normal range of motion.  Cardiovascular: Normal rate, regular rhythm, normal heart sounds and intact distal pulses.  Pulses:      Radial pulses are 2+ on the right side, and 2+ on the left side.  Unable to palpate pedal pulses bilaterally  Pulmonary/Chest: Effort normal and breath sounds normal.  Musculoskeletal: Normal range of motion. She exhibits no edema.  Neurological: She is alert and oriented to person, place, and time.  Skin: She is not diaphoretic.  Right foot: Full-thickness ulceration with exposed bone at the dorsal proximal interphalangeal joint of the right second toe.   Vitals reviewed.  BP (!) 133/57 (BP Location: Right Arm)   Pulse 67   Resp 16   Ht 5' (1.524 m)   Wt 101 lb 9.6 oz (46.1 kg)   BMI 19.84 kg/m   Past Medical History:  Diagnosis Date  . GERD (gastroesophageal reflux disease)   . Gout   . Hypertension   . Osteoarthritis   . Renal disorder    Social History   Socioeconomic History  . Marital status: Single    Spouse name: Not on file  . Number of children: Not on file  . Years of education: Not on file  . Highest education level: Not on file  Social Needs  . Financial resource strain: Not on file  . Food insecurity - worry: Not  on file  . Food insecurity - inability: Not on file  . Transportation needs - medical: Not on file  . Transportation needs - non-medical: Not on file  Occupational History  . Not on file  Tobacco Use  . Smoking status: Never Smoker  . Smokeless tobacco: Never Used  Substance and Sexual Activity  . Alcohol use: No  . Drug use: No  . Sexual activity: Not Currently  Other Topics Concern  . Not on file  Social History Narrative  . Not on file   Past Surgical History:  Procedure Laterality Date  . NO PAST SURGERIES     Family History  Problem Relation Age of Onset  . Hypertension Other    . Hyperlipidemia Other    No Known Allergies     Assessment & Plan:  Presents as a new patient referred by Dr. Alberteen Spindle for evaluation of a nonhealing ulcer and possible peripheral artery disease.  The patient is seen with an aide.  The patient's power of attorney is her niece.  Most of the patient's history is obtained from the aide.  The patient has been receiving services from the wound center for what sounds like approximately 2 months for an ulceration to the right second toe.  The patient recently was seen by Dr. Alberteen Spindle who recommended she will most likely need amputation of the second toe due to the severity of her wound.  At the time of his examination no palpable pedal pulses were noted on exam.  The patient's aide states that she is active and continues to walk briskly.  The patient does not seem to be in much discomfort due to her ulceration.  Patient does not seem to be in any pain with ambulation.  The patient underwent a bilateral lower extremity arterial ABI which was notable for right ABI of 0.31 and left ABI of 0.40.  Bilateral waveforms are practically dampened and flattened.  Right lower extremity with a biphasic peroneal and monophasic tibials.  Left lower extremity with biphasic tibials.  Patient denies any fever, nausea vomiting.  1. PAD (peripheral artery disease) (HCC) - New Patient with severe peripheral artery disease to the bilateral lower extremity The patient presents with a full thickness gangrenous ulceration of the second right toe The aide states that the patient is still very active and will walk "briskly" Podiatry feels the patient will need amputation of of the second right toe however with the patient's severe peripheral artery disease she would not have appropriate arterial blood flow to heal the amputation site.  This would certainly lead to the need for further amputation. Recommend a right lower extremity angiogram with possible intervention and attempt to assess  the patient's anatomy and contributing degree of peripheral artery disease.  If appropriate at that time an attempt to revascularize the extremity can take place Procedure, risks and benefits explained to the patient and the patient's aide At this time the patient's aide will discuss the procedure risks and benefits with the patient's power of attorney which is her niece. If the patient's power of attorney would like to move forward which the aide believes she does as she would like everything done for her and she will call the office to let us know  2. 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 Essential hypertension - Stable Encouraged good control as its slows the progression of atherosclerotic disease  Current Outpatient Medications on File Prior to  Visit  Medication Sig Dispense Refill  . allopurinol (ZYLOPRIM) 100 MG tablet Take 100 mg by mouth daily.    Marland Kitchen. aspirin 81 MG tablet Take 81 mg by mouth daily.    . cloNIDine (CATAPRES) 0.2 MG tablet Take 0.2 mg by mouth 3 (three) times daily.    Marland Kitchen. donepezil (ARICEPT) 5 MG tablet Take by mouth.    . felodipine (PLENDIL) 10 MG 24 hr tablet Take 10 mg by mouth daily.    . metoprolol (LOPRESSOR) 100 MG tablet Take 100 mg by mouth 2 (two) times daily.    . QUEtiapine (SEROQUEL) 25 MG tablet Take 25 mg by mouth 2 (two) times daily.    Marland Kitchen. amLODipine (NORVASC) 2.5 MG tablet Take 1 tablet (2.5 mg total) by mouth daily. 30 tablet 0  . [DISCONTINUED] lisinopril (PRINIVIL,ZESTRIL) 30 MG tablet Take 30 mg by mouth daily.     No current facility-administered medications on file prior to visit.    There are no Patient Instructions on file for this visit. No Follow-up on file.  Chrisangel Eskenazi A Donneisha Beane, PA-C

## 2017-08-09 ENCOUNTER — Encounter (INDEPENDENT_AMBULATORY_CARE_PROVIDER_SITE_OTHER): Payer: Self-pay

## 2017-08-10 ENCOUNTER — Encounter: Payer: Medicare Other | Admitting: Physician Assistant

## 2017-08-10 ENCOUNTER — Other Ambulatory Visit (INDEPENDENT_AMBULATORY_CARE_PROVIDER_SITE_OTHER): Payer: Self-pay | Admitting: Vascular Surgery

## 2017-08-10 DIAGNOSIS — L97512 Non-pressure chronic ulcer of other part of right foot with fat layer exposed: Secondary | ICD-10-CM | POA: Diagnosis not present

## 2017-08-12 NOTE — Progress Notes (Signed)
TAKYLA, KUCHERA ([id number]) Visit Report for 08/10/2017 Chief Complaint Document Details Patient Name: [name], [name] Date of Service: 08/10/2017 11:00 AM Medical Record Number: [id number] Patient Account Number: [id number] Date of Birth/Sex: Nov 22, 1920 (84 y.o. Female) Treating RN: Curtis Sites Primary Care Provider: Dewaine Oats Other Clinician: Referring Provider: Dewaine Oats Treating Provider/Extender: Linwood Dibbles, HOYT Weeks in Treatment: 18 Information Obtained from: Patient Chief Complaint Patient seen for complaints of Non-Healing Wound to the right second toe Electronic Signature(s) Signed: 08/11/2017 12:23:43 AM By: Lenda Kelp PA-C Entered By: Lenda Kelp on 08/10/2017 11:19:16 Chiles, Leniyah L. ([id number]) -------------------------------------------------------------------------------- HPI Details Patient Name: [name] Bayley L. Date of Service: 08/10/2017 11:00 AM Medical Record Number: [id number] Patient Account Number: [id number] Date of Birth/Sex: August 06, 1920 (84 y.o. Female) Treating RN: Curtis Sites Primary Care Provider: Dewaine Oats Other Clinician: Referring Provider: Dewaine Oats Treating Provider/Extender: STONE III, HOYT Weeks in Treatment: 18 History of Present Illness HPI Description: very pleasant 84 year old patient, no history of diabetes or smoking has had a ulceration on the right second toe dorsum for about 3 months. Besides essential hypertension and gout she does not have any other significant problems and has never been a smoker. 04/13/17 on evaluation today patient appears to be doing about the same in regard to her right second toe ulcer. We did get the results for x-ray which showed osteopenia without any specific bone abnormality osteomyelitis. She does continue to have discomfort with palpation of the wound although secondary to mental status is unable to rate or describe this pain. No fevers, chills, nausea, or vomiting noted at this  time. 04/27/2017 -- over the last couple of weeks the patient has had some issues with a urinary tract infection and some mental status changes and after review today, I have recommended we do not do the arterial studies recommended by my colleague last week, in view of the fact that the patient's wound has improved considerably and I do not want to put her through further investigations, which may not change the treatment plan. 05/04/2017 -- the patient was seen by her podiatrist and also has a CT scan pending to workup her recent mental status changes. 05/13/17 on evaluation today patient appears to be doing about the same. The wound is not significantly smaller although there is more granulation than when I last saw this. We are continuing to manage this more conservatively as patient's family does not want to delve deeper into a lot of testing they would prefer to see how the wound progresses. Fortunately there is no evidence of infection. No fevers, chills, nausea, or vomiting noted at this time. She has been tolerating the dressing changes without complication and has only minimal discomfort that she is unable to rate or describe her pain secondary to mental status. 06/15/2017 -- 84 year old patient with significant dementia is being very well taken care of by her caregivers but the big plan is for palliative care only and no aggressive treatment is to be done. 06/29/2017 -- the caregiver confirms that the family and the power of attorney did not want any aggressive treatment to be done and we will continue with palliative care. She is a bit brighter today and seems to be more oriented in time and place. 07/13/2017 -- 84 year old with a open wound on the right second toe dorsum probing down to bone has had a stable toe without any evidence of cellulitis and the power of attorney does not want any aggressive treatment to be done unless absolutely  essential. We have been giving her palliative  care for a while and today she is very awake and alert and seems well oriented in time and space. 07/27/17 on evaluation today patient unfortunately has two new ulcers one over the medial fourth toe and one on the lateral second toe just adjacent to the main ulcerated lesion. This almost appears to be possibly moisture associated breakdown although they are very circular in nature which does lend itself to an arterial insufficiency ulcer. There does not appear to be any purulent discharge from any site at this point. Patient did see Dr. Alberteen Spindle recently and he has recommended proceeding with the arterial studies at this point so that if any type of amputation is necessary they will know what needs to be done without having to guess or go and blindly. Obviously I think this is a good idea as it's possible that amputation may become necessary. He also placed her on Augmentin for six weeks. 08/10/17 on evaluation today patient appears to be doing about the same in regard to her right foot ulcers. Unfortunately the new thing that is noted is that she has an ulcer on the medial aspect of her third toe. The second toe ulcer on the dorsal aspect appears to be about the same although I'm not sure that she has not in the interim had a dorsal tendon rupture as the distal portion of her toe seems to be very loose almost as if the tendon is no longer attached on the dorsal aspect. There is [name], [name] ([id number]) also bone exposed obviously in the wound bed. she has not had her vascular procedure yet although she does have this on February 5 which is an angiogram. There are going to see if any intervention can be attempted in order to improve her blood flow to the right lower extremity. In the meantime she has been tolerating the dressing changes decently well she does have a lot of pain according to the power of attorney and caregiver who is with her today. Electronic Signature(s) Signed: 08/11/2017 12:23:43  AM By: Lenda Kelp PA-C Entered By: Lenda Kelp on 08/10/2017 12:54:40 Krabbenhoft, [name] ([id number]) -------------------------------------------------------------------------------- Physical Exam Details Patient Name: Seth, Macaila L. Date of Service: 08/10/2017 11:00 AM Medical Record Number: [id number] Patient Account Number: [id number] Date of Birth/Sex: 1921-03-04 (84 y.o. Female) Treating RN: Curtis Sites Primary Care Provider: Dewaine Oats Other Clinician: Referring Provider: TATE, Katherina Right Treating Provider/Extender: STONE III, HOYT Weeks in Treatment: 18 Constitutional Chronically ill appearing but in no apparent acute distress. Respiratory normal breathing without difficulty. clear to auscultation bilaterally. Cardiovascular regular rate and rhythm with normal S1, S2. Psychiatric Patient is not able to cooperate in decision making regarding care. Patient is oriented to person only. pleasant and cooperative. Notes At this point patient's wound bed does show bone exposure in the bed of the wound unfortunately. I'm also concerned about the fact that it appears her dorsal tendon may ruptured and the distal tip of her toe is very loose. No debridement was performed obviously in any of the wounds due to vascular status. Electronic Signature(s) Signed: 08/11/2017 12:23:43 AM By: Lenda Kelp PA-C Entered By: Lenda Kelp on 08/10/2017 12:55:53 Demir, [name] ([id number]) -------------------------------------------------------------------------------- Physician Orders Details Patient Name: [name] Bayley L. Date of Service: 08/10/2017 11:00 AM Medical Record Number: [id number] Patient Account Number: [id number] Date of Birth/Sex: April 09, 1921 (84 y.o. Female) Treating RN: Curtis Sites Primary Care Provider: Dewaine Oats Other Clinician: Referring Provider: TATE,  DENNY Treating Provider/Extender: Linwood Dibbles, HOYT Weeks in Treatment: 32 Verbal / Phone Orders: Yes Clinician:  Dorthy, Joanna Read Back and Verified: Yes Diagnosis Coding ICD-10 Coding Code Description L97.512 Non-pressure chronic ulcer of other part of right foot with fat layer exposed I73.9 Peripheral vascular disease, unspecified I10 Essential (primary) hypertension Wound Cleansing Wound #1 Right Toe Second o Clean wound with Normal Saline. Wound #2 Right Toe Great o Clean wound with Normal Saline. Wound #3 Right Toe Third o Clean wound with Normal Saline. Anesthetic (add to Medication List) Wound #1 Right Toe Second o Topical Lidocaine 4% cream applied to wound bed prior to debridement (In Clinic Only). Wound #2 Right Toe Great o Topical Lidocaine 4% cream applied to wound bed prior to debridement (In Clinic Only). Wound #3 Right Toe Third o Topical Lidocaine 4% cream applied to wound bed prior to debridement (In Clinic Only). Primary Wound Dressing Wound #1 Right Toe Second o Silvercel Non-Adherent Wound #2 Right Toe Great o Silvercel Non-Adherent Wound #3 Right Toe Third o Silvercel Non-Adherent Secondary Dressing Wound #1 Right Toe Second o Gauze and Kerlix/Conform - as needed Wound #2 Right Toe Great o Gauze and Kerlix/Conform - as needed Anagnos, Nyiesha L. ([id number]) Wound #3 Right Toe Third o Gauze and Kerlix/Conform - as needed Dressing Change Frequency Wound #1 Right Toe Second o Other: - change the bandage on shower days or PRN if soiled Wound #2 Right Toe Great o Other: - change the bandage on shower days or PRN if soiled Wound #3 Right Toe Third o Other: - change the bandage on shower days or PRN if soiled Follow-up Appointments Wound #1 Right Toe Second o Return Appointment in 2 weeks. Wound #2 Right Toe Great o Return Appointment in 2 weeks. Wound #3 Right Toe Third o Return Appointment in 2 weeks. Off-Loading Wound #1 Right Toe Second o Other: - continue wearing shoes that do not rub the top of the toe Wound #2 Right  Toe Great o Other: - continue wearing shoes that do not rub the top of the toe Wound #3 Right Toe Third o Other: - continue wearing shoes that do not rub the top of the toe Additional Orders / Instructions Wound #1 Right Toe Second o Increase protein intake. o Other: - Please add vitamin A, vitamin C and zinc supplements to your diet Wound #2 Right Toe Great o Increase protein intake. o Other: - Please add vitamin A, vitamin C and zinc supplements to your diet Wound #3 Right Toe Third o Increase protein intake. o Other: - Please add vitamin A, vitamin C and zinc supplements to your diet Radiology o MRI, lower extremity with contast - right foot Patient Medications Allergies: No Known Allergies Notifications Medication Indication Start End Vallo, Damira L. ([id number]) Notifications Medication Indication Start End lidocaine DOSE 1 - topical 4 % cream - 1 cream topical Electronic Signature(s) Signed: 08/10/2017 4:37:30 PM By: Curtis Sites Signed: 08/11/2017 12:23:43 AM By: Lenda Kelp PA-C Entered By: Curtis Sites on 08/10/2017 11:46:20 Mcnulty, Lilla L. ([id number]) -------------------------------------------------------------------------------- Prescription 08/10/2017 Patient Name: [name] Bayley L. Provider: Lenda Kelp PA-C Date of Birth: Jan 16, 1921 NPI#: [id number] Sex: F DEA#: [id number] Phone #: [phone] License #: Patient Address: [facility] [address] [facility] [place], [place] [address] [address] [place], [place] [address] [phone] Allergies No Known Allergies Medication Medication: Route: Strength: Form: lidocaine 4 % topical cream topical 4% cream Class: TOPICAL LOCAL  ANESTHETICS Dose: Frequency / Time: Indication: 1 1 cream topical Number of Refills: Number of Units: 0 Generic Substitution: Start Date: End Date: One Time Use: Substitution  Permitted No Note to Pharmacy: Signature(s): Date(s): Electronic Signature(s) Signed: 08/10/2017 4:37:30 PM By: Curtis Sites Signed: 08/11/2017 12:23:43 AM By: Lenda Kelp PA-C Entered By: Curtis Sites on 08/10/2017 11:46:21 Kenner, Melannie L. ([id number]) --------------------------------------------------------------------------------  Problem List Details Patient Name: Batchelder, Sydne L. Date of Service: 08/10/2017 11:00 AM Medical Record Number: [id number] Patient Account Number: [id number] Date of Birth/Sex: February 24, 1921 (84 y.o. Female) Treating RN: Curtis Sites Primary Care Provider: Dewaine Oats Other Clinician: Referring Provider: Dewaine Oats Treating Provider/Extender: Linwood Dibbles, HOYT Weeks in Treatment: 18 Active Problems ICD-10 Encounter Code Description Active Date Diagnosis L97.512 Non-pressure chronic ulcer of other part of right foot with fat layer 04/06/2017 Yes exposed I73.9 Peripheral vascular disease, unspecified 04/06/2017 Yes I10 Essential (primary) hypertension 04/06/2017 Yes Inactive Problems Resolved Problems Electronic Signature(s) Signed: 08/11/2017 12:23:43 AM By: Lenda Kelp PA-C Entered By: Lenda Kelp on 08/10/2017 11:19:06 Sassi, Mily L. ([id number]) -------------------------------------------------------------------------------- Progress Note Details Patient Name: Tirone, Waleska L. Date of Service: 08/10/2017 11:00 AM Medical Record Number: [id number] Patient Account Number: [id number] Date of Birth/Sex: [birth year] (84 y.o. Female) Treating RN: Curtis Sites Primary Care Provider: TATE, Katherina Right Other Clinician: Referring Provider: Dewaine Oats Treating Provider/Extender: Linwood Dibbles, HOYT Weeks in Treatment: 18 Subjective Chief Complaint Information obtained from Patient Patient seen for complaints of Non-Healing Wound to the right second toe History of Present Illness (HPI) very pleasant 84 year old patient, no history of diabetes or smoking  has had a ulceration on the right second toe dorsum for about 3 months. Besides essential hypertension and gout she does not have any other significant problems and has never been a smoker. 04/13/17 on evaluation today patient appears to be doing about the same in regard to her right second toe ulcer. We did get the results for x-ray which showed osteopenia without any specific bone abnormality osteomyelitis. She does continue to have discomfort with palpation of the wound although secondary to mental status is unable to rate or describe this pain. No fevers, chills, nausea, or vomiting noted at this time. 04/27/2017 -- over the last couple of weeks the patient has had some issues with a urinary tract infection and some mental status changes and after review today, I have recommended we do not do the arterial studies recommended by my colleague last week, in view of the fact that the patient's wound has improved considerably and I do not want to put her through further investigations, which may not change the treatment plan. 05/04/2017 -- the patient was seen by her podiatrist and also has a CT scan pending to workup her recent mental status changes. 05/13/17 on evaluation today patient appears to be doing about the same. The wound is not significantly smaller although there is more granulation than when I last saw this. We are continuing to manage this more conservatively as patient's family does not want to delve deeper into a lot of testing they would prefer to see how the wound progresses. Fortunately there is no evidence of infection. No fevers, chills, nausea, or vomiting noted at this time. She has been tolerating the dressing changes without complication and has only minimal discomfort that she is unable to rate or describe her pain secondary to mental status. 06/15/2017 -- 84 year old patient with significant dementia is being very well taken care of by her caregivers but the big plan is  for palliative care only and no aggressive treatment is to be done. 06/29/2017 -- the caregiver confirms that the family and the power of attorney did not want any aggressive treatment to be done and we will continue with palliative care. She is a bit brighter today and seems to be more oriented in time and place. 07/13/2017 -- 84 year old with a open wound on the right second toe dorsum probing down to bone has had a stable toe without any evidence of cellulitis and the power of attorney does not want any aggressive treatment to be done unless absolutely essential. We have been giving her palliative care for a while and today she is very awake and alert and seems well oriented in time and space. 07/27/17 on evaluation today patient unfortunately has two new ulcers one over the medial fourth toe and one on the lateral second toe just adjacent to the main ulcerated lesion. This almost appears to be possibly moisture associated breakdown although they are very circular in nature which does lend itself to an arterial insufficiency ulcer. There does not appear to be any purulent discharge from any site at this point. Patient did see Dr. Alberteen Spindle recently and he has recommended proceeding with the arterial studies at this point so that if any type of amputation is necessary they will know what needs to be done without having to guess or go and blindly. Obviously I think this is a good idea as it's possible that amputation may become Aracena, Lorrinda L. ([id number]) necessary. He also placed her on Augmentin for six weeks. 08/10/17 on evaluation today patient appears to be doing about the same in regard to her right foot ulcers. Unfortunately the new thing that is noted is that she has an ulcer on the medial aspect of her third toe. The second toe ulcer on the dorsal aspect appears to be about the same although I'm not sure that she has not in the interim had a dorsal tendon rupture as the distal portion of her  toe seems to be very loose almost as if the tendon is no longer attached on the dorsal aspect. There is also bone exposed obviously in the wound bed. she has not had her vascular procedure yet although she does have this on February 5 which is an angiogram. There are going to see if any intervention can be attempted in order to improve her blood flow to the right lower extremity. In the meantime she has been tolerating the dressing changes decently well she does have a lot of pain according to the power of attorney and caregiver who is with her today. Patient History Information obtained from Patient. Social History Never smoker, Marital Status - Widowed, Alcohol Use - Never, Drug Use - No History, Caffeine Use - Daily. Review of Systems (ROS) Constitutional Symptoms (General Health) Denies complaints or symptoms of Fever, Chills. Respiratory The patient has no complaints or symptoms. Cardiovascular The patient has no complaints or symptoms. Objective Constitutional Chronically ill appearing but in no apparent acute distress. Vitals Time Taken: 11:12 AM, Temperature: 98.0 F, Pulse: 52 bpm, Respiratory Rate: 16 breaths/min, Blood Pressure: 139/56 mmHg. Respiratory normal breathing without difficulty. clear to auscultation bilaterally. Cardiovascular regular rate and rhythm with normal S1, S2. Psychiatric Patient is not able to cooperate in decision making regarding care. Patient is oriented to person only. pleasant and cooperative. General Notes: At this point patient's wound bed does show bone exposure in the bed of the wound unfortunately. I'm also concerned about  the fact that it appears her dorsal tendon may ruptured and the distal tip of her toe is very loose. No debridement was performed obviously in any of the wounds due to vascular status. Integumentary (Hair, Skin) Celaya, Airam L. ([id number]) Wound #1 status is Open. Original cause of wound was Gradually Appeared. The wound  is located on the Right Toe Second. The wound measures 0.6cm length x 0.9cm width x 0.4cm depth; 0.424cm^2 area and 0.17cm^3 volume. Wound #2 status is Open. Original cause of wound was Gradually Appeared. The wound is located on the Right Toe Great. The wound measures 1.3cm length x 1cm width x 0.1cm depth; 1.021cm^2 area and 0.102cm^3 volume. Wound #3 status is Open. Original cause of wound was Gradually Appeared. The wound is located on the Right Toe Third. The wound measures 0.5cm length x 0.5cm width x 0.1cm depth; 0.196cm^2 area and 0.02cm^3 volume. Assessment Active Problems ICD-10 L97.512 - Non-pressure chronic ulcer of other part of right foot with fat layer exposed I73.9 - Peripheral vascular disease, unspecified I10 - Essential (primary) hypertension Plan Wound Cleansing: Wound #1 Right Toe Second: Clean wound with Normal Saline. Wound #2 Right Toe Great: Clean wound with Normal Saline. Wound #3 Right Toe Third: Clean wound with Normal Saline. Anesthetic (add to Medication List): Wound #1 Right Toe Second: Topical Lidocaine 4% cream applied to wound bed prior to debridement (In Clinic Only). Wound #2 Right Toe Great: Topical Lidocaine 4% cream applied to wound bed prior to debridement (In Clinic Only). Wound #3 Right Toe Third: Topical Lidocaine 4% cream applied to wound bed prior to debridement (In Clinic Only). Primary Wound Dressing: Wound #1 Right Toe Second: Silvercel Non-Adherent Wound #2 Right Toe Great: Silvercel Non-Adherent Wound #3 Right Toe Third: Silvercel Non-Adherent Secondary Dressing: Wound #1 Right Toe Second: Gauze and Kerlix/Conform - as needed Wound #2 Right Toe Great: Gauze and Kerlix/Conform - as needed Wound #3 Right Toe Third: Gauze and Kerlix/Conform - as needed Dressing Change Frequency: Wound #1 Right Toe Second: Butcher, Kataleya L. ([id number]) Other: - change the bandage on shower days or PRN if soiled Wound #2 Right Toe Great: Other:  - change the bandage on shower days or PRN if soiled Wound #3 Right Toe Third: Other: - change the bandage on shower days or PRN if soiled Follow-up Appointments: Wound #1 Right Toe Second: Return Appointment in 2 weeks. Wound #2 Right Toe Great: Return Appointment in 2 weeks. Wound #3 Right Toe Third: Return Appointment in 2 weeks. Off-Loading: Wound #1 Right Toe Second: Other: - continue wearing shoes that do not rub the top of the toe Wound #2 Right Toe Great: Other: - continue wearing shoes that do not rub the top of the toe Wound #3 Right Toe Third: Other: - continue wearing shoes that do not rub the top of the toe Additional Orders / Instructions: Wound #1 Right Toe Second: Increase protein intake. Other: - Please add vitamin A, vitamin C and zinc supplements to your diet Wound #2 Right Toe Great: Increase protein intake. Other: - Please add vitamin A, vitamin C and zinc supplements to your diet Wound #3 Right Toe Third: Increase protein intake. Other: - Please add vitamin A, vitamin C and zinc supplements to your diet Radiology ordered were: MRI, lower extremity with contast - right foot The following medication(s) was prescribed: lidocaine topical 4 % cream 1 1 cream topical was prescribed at facility I'm concerned about the fact that patient's toe seems to be loosening the distal TIF  I am going to order an MRI of the right foot to further evaluate this as again I am somewhat concerned about the possibility of osteomyelitis. We have not had an MRI up to this point. We are gonna see what vascular is able to do in regard to the angiogram upcoming on February 5. Ultimately depending on the results of that as well is the MRI I do believe that we may be looking at a potential invitation situation the question is just to what degree and severity the amputation would be needed. Obviously this will be up to Dr. Graciela Husbands not myself but in the meantime we will continue with the Current  wound care measures per above. We will see what the testing shows. Please see above for specific wound care orders. We will see patient for re-evaluation in 2 week(s) here in the clinic. If anything worsens or changes patient will contact our office for additional recommendations. Electronic Signature(s) Signed: 08/11/2017 12:23:43 AM By: Lenda Kelp PA-C Entered By: Lenda Kelp on 08/10/2017 12:57:44 Manard, [name] ([id number]) -------------------------------------------------------------------------------- ROS/PFSH Details Patient Name: [name] Bayley L. Date of Service: 08/10/2017 11:00 AM Medical Record Number: [id number] Patient Account Number: [id number] Date of Birth/Sex: [birth year] (84 y.o. Female) Treating RN: Curtis Sites Primary Care Provider: TATE, Katherina Right Other Clinician: Referring Provider: Dewaine Oats Treating Provider/Extender: Linwood Dibbles, HOYT Weeks in Treatment: 18 Information Obtained From Patient Wound History Do you currently have one or more open woundso Yes How many open wounds do you currently haveo 1 Approximately how long have you had your woundso 3 months How have you been treating your wound(s) until nowo neosporin Has your wound(s) ever healed and then re-openedo No Have you had any lab work done in the past montho No Have you tested positive for an antibiotic resistant organism (MRSA, VRE)o No Have you tested positive for osteomyelitis (bone infection)o No Have you had any tests for circulation on your legso No Constitutional Symptoms (General Health) Complaints and Symptoms: Negative for: Fever; Chills Respiratory Complaints and Symptoms: No Complaints or Symptoms Cardiovascular Complaints and Symptoms: No Complaints or Symptoms Medical History: Positive for: Hypertension Musculoskeletal Medical History: Positive for: Gout; Osteoarthritis Immunizations Pneumococcal Vaccine: Received Pneumococcal Vaccination: Yes Immunization Notes: up to  date Implantable Devices Family and Social History Never smoker; Marital Status - Widowed; Alcohol Use: Never; Drug Use: No History; Caffeine Use: Daily; Financial Concerns: No; Food, Clothing or Shelter Needs: No; Support System Lacking: No; Transportation Concerns: No; Advanced Burel, Tahliyah L. ([id number]) Directives: Yes (Not Provided); Patient does not want information on Advanced Directives; Medical Power of Attorney: Yes - Ardeen Jourdain - niece (Not Provided) Physician Affirmation I have reviewed and agree with the above information. Electronic Signature(s) Signed: 08/10/2017 4:37:30 PM By: Curtis Sites Signed: 08/11/2017 12:23:43 AM By: Lenda Kelp PA-C Entered By: Lenda Kelp on 08/10/2017 12:55:07 Ventrella, Tanishia LMarland Kitchen ([id number]) -------------------------------------------------------------------------------- SuperBill Details Patient Name: Swiney, Virjean L. Date of Service: 08/10/2017 Medical Record Number: [id number] Patient Account Number: [id number] Date of Birth/Sex: [birth year] (84 y.o. Female) Treating RN: Curtis Sites Primary Care Provider: Dewaine Oats Other Clinician: Referring Provider: Dewaine Oats Treating Provider/Extender: STONE III, HOYT Weeks in Treatment: 18 Diagnosis Coding ICD-10 Codes Code Description L97.512 Non-pressure chronic ulcer of other part of right foot with fat layer exposed I73.9 Peripheral vascular disease, unspecified I10 Essential (primary) hypertension Facility Procedures CPT4 Code: 24401027 Description: 99214 - WOUND CARE VISIT-LEV 4 EST PT Modifier: Quantity: 1 Physician Procedures CPT4 Code: 2536644 Description: 03474 -  WC PHYS LEVEL 3 - EST PT ICD-10 Diagnosis Description L97.512 Non-pressure chronic ulcer of other part of right foot with fat I73.9 Peripheral vascular disease, unspecified I10 Essential (primary) hypertension Modifier: layer exposed Quantity: 1 Electronic Signature(s) Signed: 08/11/2017 12:23:43 AM By: Lenda Kelp PA-C Entered By: Lenda Kelp on 08/10/2017 12:57:58

## 2017-08-14 ENCOUNTER — Other Ambulatory Visit: Payer: Self-pay | Admitting: Physician Assistant

## 2017-08-14 DIAGNOSIS — S81809A Unspecified open wound, unspecified lower leg, initial encounter: Secondary | ICD-10-CM

## 2017-08-14 NOTE — Progress Notes (Signed)
[name], Lis L. ([id number]) Visit Report for 08/10/2017 Arrival Information Details Patient Name: [name], [name] Date of Service: 08/10/2017 11:00 AM Medical Record Number: [id number] Patient Account Number: [id number] Date of Birth/Sex: 09/12/1920 (84 y.o. Female) Treating RN: Curtis Sitesorthy, Joanna Primary Care Monty Mccarrell: Dewaine OatsATE, DENNY Other Clinician: Referring Gwenn Teodoro: Dewaine OatsATE, DENNY Treating Loraine Bhullar/Extender: Linwood DibblesSTONE III, HOYT Weeks in Treatment: 18 Visit Information History Since Last Visit Added or deleted any medications: No Patient Arrived: Wheel Chair Any new allergies or adverse reactions: No Arrival Time: 11:10 Had a fall or experienced change in No activities of daily living that may affect Accompanied By: staff risk of falls: Transfer Assistance: None Signs or symptoms of abuse/neglect since last visito No Patient Identification Verified: Yes Hospitalized since last visit: No Secondary Verification Process Completed: Yes Has Dressing in Place as Prescribed: Yes Patient Requires Transmission-Based No Pain Present Now: No Precautions: Patient Has Alerts: Yes Electronic Signature(s) Signed: 08/10/2017 4:37:30 PM By: Curtis Sitesorthy, Joanna Entered By: Curtis Sitesorthy, Joanna on 08/10/2017 11:12:01 Laske, Avanni L. ([id number]) -------------------------------------------------------------------------------- Clinic Level of Care Assessment Details Patient Name: [name], [name] Date of Service: 08/10/2017 11:00 AM Medical Record Number: [id number] Patient Account Number: [id number] Date of Birth/Sex: 09/12/1920 (84 y.o. Female) Treating RN: Curtis Sitesorthy, Joanna Primary Care Oney Folz: TATE, Katherina RightENNY Other Clinician: Referring Traven Davids: TATE, Katherina RightENNY Treating Dejon Jungman/Extender: Linwood DibblesSTONE III, HOYT Weeks in Treatment: 18 Clinic Level of Care Assessment Items TOOL 4 Quantity Score []  - Use when only an EandM is performed on FOLLOW-UP visit 0 ASSESSMENTS - Nursing Assessment / Reassessment X - Reassessment of  Co-morbidities (includes updates in patient status) 1 10 X- 1 5 Reassessment of Adherence to Treatment Plan ASSESSMENTS - Wound and Skin Assessment / Reassessment []  - Simple Wound Assessment / Reassessment - one wound 0 X- 3 5 Complex Wound Assessment / Reassessment - multiple wounds []  - 0 Dermatologic / Skin Assessment (not related to wound area) ASSESSMENTS - Focused Assessment []  - Circumferential Edema Measurements - multi extremities 0 []  - 0 Nutritional Assessment / Counseling / Intervention X- 1 5 Lower Extremity Assessment (monofilament, tuning fork, pulses) []  - 0 Peripheral Arterial Disease Assessment (using hand held doppler) ASSESSMENTS - Ostomy and/or Continence Assessment and Care []  - Incontinence Assessment and Management 0 []  - 0 Ostomy Care Assessment and Management (repouching, etc.) PROCESS - Coordination of Care X - Simple Patient / Family Education for ongoing care 1 15 []  - 0 Complex (extensive) Patient / Family Education for ongoing care []  - 0 Staff obtains ChiropractorConsents, Records, Test Results / Process Orders []  - 0 Staff telephones HHA, Nursing Homes / Clarify orders / etc []  - 0 Routine Transfer to another Facility (non-emergent condition) []  - 0 Routine Hospital Admission (non-emergent condition) []  - 0 New Admissions / Manufacturing engineernsurance Authorizations / Ordering NPWT, Apligraf, etc. []  - 0 Emergency Hospital Admission (emergent condition) X- 1 10 Simple Discharge Coordination Chason, Fayelynn L. ([id number]) []  - 0 Complex (extensive) Discharge Coordination PROCESS - Special Needs []  - Pediatric / Minor Patient Management 0 []  - 0 Isolation Patient Management []  - 0 Hearing / Language / Visual special needs []  - 0 Assessment of Community assistance (transportation, D/C planning, etc.) []  - 0 Additional assistance / Altered mentation []  - 0 Support Surface(s) Assessment (bed, cushion, seat, etc.) INTERVENTIONS - Wound Cleansing / Measurement []  -  Simple Wound Cleansing - one wound 0 X- 3 5 Complex Wound Cleansing - multiple wounds X- 1 5 Wound Imaging (photographs - any number of wounds) []  - 0 Wound Tracing (  instead of photographs) []  - 0 Simple Wound Measurement - one wound X- 3 5 Complex Wound Measurement - multiple wounds INTERVENTIONS - Wound Dressings X - Small Wound Dressing one or multiple wounds 3 10 []  - 0 Medium Wound Dressing one or multiple wounds []  - 0 Large Wound Dressing one or multiple wounds []  - 0 Application of Medications - topical []  - 0 Application of Medications - injection INTERVENTIONS - Miscellaneous []  - External ear exam 0 []  - 0 Specimen Collection (cultures, biopsies, blood, body fluids, etc.) []  - 0 Specimen(s) / Culture(s) sent or taken to Lab for analysis []  - 0 Patient Transfer (multiple staff / Nurse, adult / Similar devices) []  - 0 Simple Staple / Suture removal (25 or less) []  - 0 Complex Staple / Suture removal (26 or more) []  - 0 Hypo / Hyperglycemic Management (close monitor of Blood Glucose) []  - 0 Ankle / Brachial Index (ABI) - do not check if billed separately X- 1 5 Vital Signs Deeg, Jasiel L. ([id number]) Has the patient been seen at the hospital within the last three years: Yes Total Score: 130 Level Of Care: New/Established - Level 4 Electronic Signature(s) Signed: 08/10/2017 4:37:30 PM By: Curtis Sites Entered By: Curtis Sites on 08/10/2017 12:01:39 Carducci, Lurene Elbert Ewings ([id number]) -------------------------------------------------------------------------------- Encounter Discharge Information Details Patient Name: [name] Bayley L. Date of Service: 08/10/2017 11:00 AM Medical Record Number: [id number] Patient Account Number: [id number] Date of Birth/Sex: May 29, 1921 (84 y.o. Female) Treating RN: Curtis Sites Primary Care Nykira Reddix: Dewaine Oats Other Clinician: Referring Cattleya Dobratz: Dewaine Oats Treating Arren Laminack/Extender: Linwood Dibbles, HOYT Weeks in Treatment:  18 Encounter Discharge Information Items Discharge Pain Level: 0 Discharge Condition: Stable Ambulatory Status: Ambulatory Discharge Destination: Home Transportation: Private Auto Accompanied By: caregiver Schedule Follow-up Appointment: Yes Medication Reconciliation completed and No provided to Patient/Care Jaidin Richison: Provided on Clinical Summary of Care: 08/10/2017 Form Type Recipient Paper Patient [facility] Electronic Signature(s) Signed: 08/14/2017 10:47:22 AM By: Gwenlyn Perking Entered By: Gwenlyn Perking on 08/10/2017 11:51:03 Jindra, Chizuko L. ([id number]) -------------------------------------------------------------------------------- Lower Extremity Assessment Details Patient Name: Montante, Kamyra L. Date of Service: 08/10/2017 11:00 AM Medical Record Number: [id number] Patient Account Number: [id number] Date of Birth/Sex: 09-Jun-1921 (84 y.o. Female) Treating RN: Curtis Sites Primary Care Somnang Mahan: Dewaine Oats Other Clinician: Referring Khady Vandenberg: TATE, Katherina Right Treating Baily Hovanec/Extender: Linwood Dibbles, HOYT Weeks in Treatment: 18 Vascular Assessment Pulses: Dorsalis Pedis Palpable: [Right:No] Doppler Audible: [Right:Yes] Posterior Tibial Extremity colors, hair growth, and conditions: Extremity Color: [Right:Hyperpigmented] Temperature of Extremity: [Right:Cool] Capillary Refill: [Right:< 3 seconds] Electronic Signature(s) Signed: 08/10/2017 4:37:30 PM By: Curtis Sites Entered By: Curtis Sites on 08/10/2017 11:37:17 Hoelting, Mark L. ([id number]) -------------------------------------------------------------------------------- Multi Wound Chart Details Patient Name: Oddo, Deborra L. Date of Service: 08/10/2017 11:00 AM Medical Record Number: [id number] Patient Account Number: [id number] Date of Birth/Sex: 19-Jul-1920 (84 y.o. Female) Treating RN: Curtis Sites Primary Care Bindi Klomp: Dewaine Oats Other Clinician: Referring Logan Baltimore: Dewaine Oats Treating Rabecka Brendel/Extender: STONE III,  HOYT Weeks in Treatment: 18 Vital Signs Height(in): Pulse(bpm): 52 Weight(lbs): Blood Pressure(mmHg): 139/56 Body Mass Index(BMI): Temperature(F): 98.0 Respiratory Rate 16 (breaths/min): Photos: [1:No Photos] [2:No Photos] [3:No Photos] Wound Location: [1:Right Toe Second] [2:Right Toe Great] [3:Right Toe Third] Wounding Event: [1:Gradually Appeared] [2:Gradually Appeared] [3:Gradually Appeared] Primary Etiology: [1:Arterial Insufficiency Ulcer] [2: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] Date Acquired: [1:12/26/2016] [2:07/18/2017] [3:07/18/2017] Weeks of Treatment: [1:18] [2:2] [3:2] Wound Status: [1:Open] [2:Open] [3:Open] Pending Amputation on [1:Yes] [2:No] [3:No] Presentation: Measurements L x W x D [1:0.6x0.9x0.4] [2:1.3x1x0.1] [3:0.5x0.5x0.1] (cm) Area (cm) : [1:0.424] [2:1.021] [3:0.196]  Volume (cm) : [1:0.17] [2:0.102] [3:0.02] % Reduction in Area: [1:-351.10%] [2:-132.00%] [3:-55.60%] % Reduction in Volume: [1:-1788.90%] [2:-131.80%] [3:-53.80%] Classification: [1:Full Thickness With Exposed Support Structures] [2:Full Thickness Without Exposed Support Structures] [3:Full Thickness Without Exposed Support Structures] Periwound Skin Texture: [1:No Abnormalities Noted] [2:No Abnormalities Noted] [3:No Abnormalities Noted] Periwound Skin Moisture: [1:No Abnormalities Noted] [2:No Abnormalities Noted] [3:No Abnormalities Noted] Periwound Skin Color: [1:No Abnormalities Noted No] [2:No Abnormalities Noted No] [3:No Abnormalities Noted No] Treatment Notes Electronic Signature(s) Signed: 08/10/2017 4:37:30 PM By: Curtis Sites Entered By: Curtis Sites on 08/10/2017 11:38:01 Pappalardo, Joyia Elbert Ewings ([id number]) -------------------------------------------------------------------------------- Multi-Disciplinary Care Plan Details Patient Name: [name] Fleet. Date of Service: 08/10/2017 11:00 AM Medical Record Number: [id number] Patient Account Number:  [id number] Date of Birth/Sex: [birth year] (84 y.o. Female) Treating RN: Curtis Sites Primary Care Chet Greenley: Dewaine Oats Other Clinician: Referring Julieanne Hadsall: Dewaine Oats Treating Aarion Metzgar/Extender: Linwood Dibbles, HOYT Weeks in Treatment: 18 Active Inactive ` Abuse / Safety / Falls / Self Care Management Nursing Diagnoses: Impaired physical mobility Goals: Patient will remain injury free related to falls Date Initiated: 04/06/2017 Target Resolution Date: 06/22/2017 Goal Status: Active Interventions: Assess fall risk on admission and as needed Notes: ` Orientation to the Wound Care Program Nursing Diagnoses: Knowledge deficit related to the wound healing center program Goals: Patient/caregiver will verbalize understanding of the Wound Healing Center Program Date Initiated: 04/06/2017 Target Resolution Date: 06/22/2017 Goal Status: Active Interventions: Provide education on orientation to the wound center Notes: ` Wound/Skin Impairment Nursing Diagnoses: Impaired tissue integrity Goals: Ulcer/skin breakdown will heal within 14 weeks Date Initiated: 04/06/2017 Target Resolution Date: 06/22/2017 Goal Status: Active Interventions: TIARNA, KOPPEN ([id number]) Assess patient/caregiver ability to obtain necessary supplies Assess patient/caregiver ability to perform ulcer/skin care regimen upon admission and as needed Assess ulceration(s) every visit Notes: Electronic Signature(s) Signed: 08/10/2017 4:37:30 PM By: Curtis Sites Entered By: Curtis Sites on 08/10/2017 11:37:48 Huckeba, Serenidy L. ([id number]) -------------------------------------------------------------------------------- Pain Assessment Details Patient Name: Shahan, Khady L. Date of Service: 08/10/2017 11:00 AM Medical Record Number: [id number] Patient Account Number: [id number] Date of Birth/Sex: 04/16/1921 (84 y.o. Female) Treating RN: Curtis Sites Primary Care Deon Duer: Dewaine Oats Other Clinician: Referring  Emmerson Shuffield: Dewaine Oats Treating Xzavien Harada/Extender: STONE III, HOYT Weeks in Treatment: 18 Active Problems Location of Pain Severity and Description of Pain Patient Has Paino No Site Locations Pain Management and Medication Current Pain Management: Electronic Signature(s) Signed: 08/10/2017 4:37:30 PM By: Curtis Sites Entered By: Curtis Sites on 08/10/2017 11:12:10 Klecker, Olayinka Elbert Ewings ([id number]) -------------------------------------------------------------------------------- Patient/Caregiver Education Details Patient Name: [name] Fleet. Date of Service: 08/10/2017 11:00 AM Medical Record Number: [id number] Patient Account Number: [id number] Date of Birth/Gender: August 04, 1920 (84 y.o. Female) Treating RN: Curtis Sites Primary Care Physician: Dewaine Oats Other Clinician: Referring Physician: Dewaine Oats Treating Physician/Extender: Skeet Simmer in Treatment: 18 Education Assessment Education Provided To: Patient and Caregiver caregiver Education Topics Provided Wound/Skin Impairment: Handouts: Caring for Your Ulcer, Other: change dressing as ordered Methods: Demonstration, Explain/Verbal Responses: State content correctly Electronic Signature(s) Signed: 08/10/2017 4:37:30 PM By: Curtis Sites Entered By: Curtis Sites on 08/10/2017 11:41:37 Rafanan, Phillipa L. ([id number]) -------------------------------------------------------------------------------- Wound Assessment Details Patient Name: Vester, Onell L. Date of Service: 08/10/2017 11:00 AM Medical Record Number: [id number] Patient Account Number: [id number] Date of Birth/Sex: [birth year] (84 y.o. Female) Treating RN: Curtis Sites Primary Care Alexandre Lightsey: Dewaine Oats Other Clinician: Referring Amoria Mclees: TATE, Katherina Right Treating Abdikadir Fohl/Extender: STONE III, HOYT Weeks in Treatment: 18 Wound Status Wound Number: 1 Primary Etiology: Arterial Insufficiency Ulcer Wound Location: Right Toe Second Wound  Status: Open Wounding  Event: Gradually Appeared Date Acquired: 12/26/2016 Weeks Of Treatment: 18 Clustered Wound: No Pending Amputation On Presentation Photos Photo Uploaded By: Elliot Gurney, BSN, RN, CWS, Kim on 08/10/2017 17:41:44 Wound Measurements Length: (cm) 0.6 Width: (cm) 0.9 Depth: (cm) 0.4 Area: (cm) 0.424 Volume: (cm) 0.17 % Reduction in Area: -351.1% % Reduction in Volume: -1788.9% Wound Description Full Thickness With Exposed Support Classification: Structures Periwound Skin Texture Texture Color No Abnormalities Noted: No No Abnormalities Noted: No Moisture No Abnormalities Noted: No Treatment Notes Wound #1 (Right Toe Second) 1. Cleansed with: Clean wound with Normal Saline 2. Anesthetic Scaffidi, Barbarita L. ([id number]) Topical Lidocaine 4% cream to wound bed prior to debridement 4. Dressing Applied: Other dressing (specify in notes) 7. Secured with Tape Notes silvercel Electronic Signature(s) Signed: 08/10/2017 4:37:30 PM By: Curtis Sites Entered By: Curtis Sites on 08/10/2017 11:18:06 Ellerbrock, Toula L. ([id number]) -------------------------------------------------------------------------------- Wound Assessment Details Patient Name: Boulden, Christina L. Date of Service: 08/10/2017 11:00 AM Medical Record Number: [id number] Patient Account Number: [id number] Date of Birth/Sex: 1921-06-21 (84 y.o. Female) Treating RN: Curtis Sites Primary Care Brindle Leyba: TATE, Katherina Right Other Clinician: Referring Naiomi Musto: Dewaine Oats Treating Waseem Suess/Extender: STONE III, HOYT Weeks in Treatment: 18 Wound Status Wound Number: 2 Primary Etiology: Arterial Insufficiency Ulcer Wound Location: Right Toe Great Wound Status: Open Wounding Event: Gradually Appeared Date Acquired: 07/18/2017 Weeks Of Treatment: 2 Clustered Wound: No Photos Photo Uploaded By: Elliot Gurney, BSN, RN, CWS, Kim on 08/10/2017 17:41:59 Wound Measurements Length: (cm) 1.3 Width: (cm) 1 Depth: (cm) 0.1 Area: (cm) 1.021 Volume:  (cm) 0.102 % Reduction in Area: -132% % Reduction in Volume: -131.8% Wound Description Full Thickness Without Exposed Support Classification: Structures Periwound Skin Texture Texture Color No Abnormalities Noted: No No Abnormalities Noted: No Moisture No Abnormalities Noted: No Treatment Notes Wound #2 (Right Toe Great) 1. Cleansed with: Clean wound with Normal Saline 2. Anesthetic Topical Lidocaine 4% cream to wound bed prior to debridement [place], Kyliah L. ([id number]) 4. Dressing Applied: Other dressing (specify in notes) 7. Secured with Tape Notes silvercel Electronic Signature(s) Signed: 08/10/2017 4:37:30 PM By: Curtis Sites Entered By: Curtis Sites on 08/10/2017 11:18:06 Stencil, Carlye L. ([id number]) -------------------------------------------------------------------------------- Wound Assessment Details Patient Name: Dibari, Jasdeep L. Date of Service: 08/10/2017 11:00 AM Medical Record Number: [id number] Patient Account Number: [id number] Date of Birth/Sex: December 24, 1920 (84 y.o. Female) Treating RN: Curtis Sites Primary Care Geronimo Diliberto: TATE, Katherina Right Other Clinician: Referring Shayanna Thatch: Dewaine Oats Treating Laterra Lubinski/Extender: STONE III, HOYT Weeks in Treatment: 18 Wound Status Wound Number: 3 Primary Etiology: Arterial Insufficiency Ulcer Wound Location: Right Toe Third Wound Status: Open Wounding Event: Gradually Appeared Date Acquired: 07/18/2017 Weeks Of Treatment: 2 Clustered Wound: No Photos Photo Uploaded By: Elliot Gurney, BSN, RN, CWS, Kim on 08/10/2017 17:42:40 Wound Measurements Length: (cm) 0.5 Width: (cm) 0.5 Depth: (cm) 0.1 Area: (cm) 0.196 Volume: (cm) 0.02 % Reduction in Area: -55.6% % Reduction in Volume: -53.8% Wound Description Full Thickness Without Exposed Support Classification: Structures Periwound Skin Texture Texture Color No Abnormalities Noted: No No Abnormalities Noted: No Moisture No Abnormalities Noted: No Treatment  Notes Wound #3 (Right Toe Third) 1. Cleansed with: Clean wound with Normal Saline 2. Anesthetic Topical Lidocaine 4% cream to wound bed prior to debridement Obey, Alethea L. ([id number]) 4. Dressing Applied: Other dressing (specify in notes) 7. Secured with Tape Notes silvercel Electronic Signature(s) Signed: 08/10/2017 4:37:30 PM By: Curtis Sites Entered By: Curtis Sites on 08/10/2017 11:18:06 Blocher, Marli L. ([id number]) -------------------------------------------------------------------------------- Vitals Details Patient Name: Foor, Sheronda L. Date of Service: 08/10/2017  11:00 AM Medical Record Number: [id number] Patient Account Number: [id number] Date of Birth/Sex: [birth year] (84 y.o. Female) Treating RN: Curtis Sites Primary Care Fatin Bachicha: TATE, Katherina Right Other Clinician: Referring Mahad Newstrom: TATE, Katherina Right Treating Evaan Tidwell/Extender: STONE III, HOYT Weeks in Treatment: 18 Vital Signs Time Taken: 11:12 Temperature (F): 98.0 Pulse (bpm): 52 Respiratory Rate (breaths/min): 16 Blood Pressure (mmHg): 139/56 Reference Range: 80 - 120 mg / dl Electronic Signature(s) Signed: 08/10/2017 4:37:30 PM By: Curtis Sites Entered By: Curtis Sites on 08/10/2017 11:13:21

## 2017-08-16 ENCOUNTER — Ambulatory Visit: Payer: Medicare Other

## 2017-08-20 ENCOUNTER — Encounter
Admission: RE | Admit: 2017-08-20 | Discharge: 2017-08-20 | Disposition: A | Discharge status: Home / Self Care | Payer: Medicare Other | Source: Ambulatory Visit | Attending: Vascular Surgery | Admitting: Vascular Surgery

## 2017-08-20 DIAGNOSIS — Z7982 Long term (current) use of aspirin: Secondary | ICD-10-CM | POA: Diagnosis not present

## 2017-08-20 DIAGNOSIS — I7025 Atherosclerosis of native arteries of other extremities with ulceration: Secondary | ICD-10-CM | POA: Diagnosis not present

## 2017-08-20 DIAGNOSIS — M109 Gout, unspecified: Secondary | ICD-10-CM | POA: Diagnosis not present

## 2017-08-20 DIAGNOSIS — Z8249 Family history of ischemic heart disease and other diseases of the circulatory system: Secondary | ICD-10-CM | POA: Diagnosis not present

## 2017-08-20 DIAGNOSIS — N289 Disorder of kidney and ureter, unspecified: Secondary | ICD-10-CM | POA: Diagnosis not present

## 2017-08-20 DIAGNOSIS — M1990 Unspecified osteoarthritis, unspecified site: Secondary | ICD-10-CM | POA: Diagnosis not present

## 2017-08-20 DIAGNOSIS — L97513 Non-pressure chronic ulcer of other part of right foot with necrosis of muscle: Secondary | ICD-10-CM | POA: Diagnosis not present

## 2017-08-20 DIAGNOSIS — I10 Essential (primary) hypertension: Secondary | ICD-10-CM | POA: Diagnosis not present

## 2017-08-20 DIAGNOSIS — Z79899 Other long term (current) drug therapy: Secondary | ICD-10-CM | POA: Diagnosis not present

## 2017-08-20 LAB — BUN: BUN: 42 mg/dL — AB (ref 6–20)

## 2017-08-20 LAB — CREATININE, SERUM
Creatinine, Ser: 1.82 mg/dL — ABNORMAL HIGH (ref 0.44–1.00)
GFR calc Af Amer: 26 mL/min — ABNORMAL LOW (ref >60)
GFR calc non Af Amer: 23 mL/min — ABNORMAL LOW (ref >60)

## 2017-08-20 MED ORDER — CEFAZOLIN SODIUM-DEXTROSE 2-4 GM/100ML-% IV SOLN
2.0000 g | Freq: Once | INTRAVENOUS | Status: AC
  Administered 2017-08-21: 2 g via INTRAVENOUS

## 2017-08-21 ENCOUNTER — Encounter: Payer: Self-pay | Admitting: *Deleted

## 2017-08-21 ENCOUNTER — Encounter
Admission: RE | Disposition: A | Discharge status: Nursing Home (Includes ICF) | Payer: Self-pay | Source: Ambulatory Visit | Attending: Vascular Surgery

## 2017-08-21 ENCOUNTER — Ambulatory Visit
Admission: RE | Admit: 2017-08-21 | Discharge: 2017-08-21 | Disposition: A | Discharge status: Nursing Home (Includes ICF) | Payer: Medicare Other | Source: Ambulatory Visit | Attending: Vascular Surgery | Admitting: Vascular Surgery

## 2017-08-21 DIAGNOSIS — I10 Essential (primary) hypertension: Secondary | ICD-10-CM | POA: Insufficient documentation

## 2017-08-21 DIAGNOSIS — N289 Disorder of kidney and ureter, unspecified: Secondary | ICD-10-CM | POA: Insufficient documentation

## 2017-08-21 DIAGNOSIS — I739 Peripheral vascular disease, unspecified: Secondary | ICD-10-CM

## 2017-08-21 DIAGNOSIS — M109 Gout, unspecified: Secondary | ICD-10-CM | POA: Insufficient documentation

## 2017-08-21 DIAGNOSIS — Z79899 Other long term (current) drug therapy: Secondary | ICD-10-CM | POA: Insufficient documentation

## 2017-08-21 DIAGNOSIS — L97513 Non-pressure chronic ulcer of other part of right foot with necrosis of muscle: Secondary | ICD-10-CM | POA: Insufficient documentation

## 2017-08-21 DIAGNOSIS — Z8249 Family history of ischemic heart disease and other diseases of the circulatory system: Secondary | ICD-10-CM | POA: Insufficient documentation

## 2017-08-21 DIAGNOSIS — M1990 Unspecified osteoarthritis, unspecified site: Secondary | ICD-10-CM | POA: Insufficient documentation

## 2017-08-21 DIAGNOSIS — I7025 Atherosclerosis of native arteries of other extremities with ulceration: Secondary | ICD-10-CM | POA: Insufficient documentation

## 2017-08-21 DIAGNOSIS — Z7982 Long term (current) use of aspirin: Secondary | ICD-10-CM | POA: Insufficient documentation

## 2017-08-21 DIAGNOSIS — I70238 Atherosclerosis of native arteries of right leg with ulceration of other part of lower right leg: Secondary | ICD-10-CM

## 2017-08-21 HISTORY — PX: LOWER EXTREMITY ANGIOGRAPHY: CATH118251

## 2017-08-21 SURGERY — LOWER EXTREMITY ANGIOGRAPHY
Anesthesia: Moderate Sedation | Laterality: Right

## 2017-08-21 MED ORDER — METHYLPREDNISOLONE SODIUM SUCC 125 MG IJ SOLR: 125.0000 mg | INTRAMUSCULAR | Status: DC | PRN

## 2017-08-21 MED ORDER — FAMOTIDINE 20 MG PO TABS: 40.0000 mg | ORAL_TABLET | ORAL | Status: DC | PRN

## 2017-08-21 MED ORDER — ONDANSETRON HCL 4 MG/2ML IJ SOLN: 4.0000 mg | Freq: Four times a day (QID) | INTRAMUSCULAR | Status: DC | PRN

## 2017-08-21 MED ORDER — LABETALOL HCL 5 MG/ML IV SOLN: 10.0000 mg | INTRAVENOUS | Status: DC | PRN

## 2017-08-21 MED ORDER — SODIUM CHLORIDE 0.9 % IV SOLN: INTRAVENOUS | Status: DC

## 2017-08-21 MED ORDER — SODIUM CHLORIDE 0.9 % IV SOLN
INTRAVENOUS | Status: DC
  Administered 2017-08-21: 10:00:00 via INTRAVENOUS

## 2017-08-21 MED ORDER — SODIUM CHLORIDE 0.9% FLUSH: 3.0000 mL | INTRAVENOUS | Status: DC | PRN

## 2017-08-21 MED ORDER — FENTANYL CITRATE (PF) 100 MCG/2ML IJ SOLN
INTRAMUSCULAR | Status: DC | PRN
  Administered 2017-08-21 (×2): 25 ug via INTRAVENOUS
  Administered 2017-08-21: 12.5 ug via INTRAVENOUS
  Administered 2017-08-21: 25 ug via INTRAVENOUS
  Administered 2017-08-21: 12.5 ug via INTRAVENOUS

## 2017-08-21 MED ORDER — FENTANYL CITRATE (PF) 100 MCG/2ML IJ SOLN
INTRAMUSCULAR | Status: AC
  Filled 2017-08-21: qty 2

## 2017-08-21 MED ORDER — HYDRALAZINE HCL 20 MG/ML IJ SOLN
INTRAMUSCULAR | Status: AC
  Filled 2017-08-21: qty 1

## 2017-08-21 MED ORDER — HYDRALAZINE HCL 20 MG/ML IJ SOLN: 5.0000 mg | INTRAMUSCULAR | Status: DC | PRN

## 2017-08-21 MED ORDER — MIDAZOLAM HCL 2 MG/2ML IJ SOLN
INTRAMUSCULAR | Status: DC | PRN
  Administered 2017-08-21 (×4): 0.5 mg via INTRAVENOUS
  Administered 2017-08-21: 1 mg via INTRAVENOUS

## 2017-08-21 MED ORDER — SODIUM CHLORIDE 0.9% FLUSH: 3.0000 mL | Freq: Two times a day (BID) | INTRAVENOUS | Status: DC

## 2017-08-21 MED ORDER — HYDRALAZINE HCL 20 MG/ML IJ SOLN
INTRAMUSCULAR | Status: DC | PRN
  Administered 2017-08-21 (×2): 10 mg via INTRAVENOUS

## 2017-08-21 MED ORDER — IOPAMIDOL (ISOVUE-300) INJECTION 61%
INTRAVENOUS | Status: DC | PRN
  Administered 2017-08-21: 90 mL via INTRAVENOUS

## 2017-08-21 MED ORDER — LIDOCAINE HCL (PF) 1 % IJ SOLN
INTRAMUSCULAR | Status: AC
  Filled 2017-08-21: qty 30

## 2017-08-21 MED ORDER — HEPARIN SODIUM (PORCINE) 1000 UNIT/ML IJ SOLN
INTRAMUSCULAR | Status: DC | PRN
  Administered 2017-08-21: 3000 [IU] via INTRAVENOUS

## 2017-08-21 MED ORDER — MIDAZOLAM HCL 5 MG/5ML IJ SOLN
INTRAMUSCULAR | Status: AC
  Filled 2017-08-21: qty 5

## 2017-08-21 MED ORDER — OXYCODONE HCL 5 MG PO TABS: 5.0000 mg | ORAL_TABLET | ORAL | Status: DC | PRN

## 2017-08-21 MED ORDER — HYDROMORPHONE HCL 1 MG/ML IJ SOLN: 1.0000 mg | Freq: Once | INTRAMUSCULAR | Status: DC | PRN

## 2017-08-21 MED ORDER — SODIUM CHLORIDE 0.9 % IV SOLN: 250.0000 mL | INTRAVENOUS | Status: DC | PRN

## 2017-08-21 MED ORDER — HEPARIN SODIUM (PORCINE) 1000 UNIT/ML IJ SOLN
INTRAMUSCULAR | Status: AC
  Filled 2017-08-21: qty 1

## 2017-08-21 MED ORDER — MORPHINE SULFATE (PF) 4 MG/ML IV SOLN: 2.0000 mg | INTRAVENOUS | Status: DC | PRN

## 2017-08-21 SURGICAL SUPPLY — 25 items
BALLN ULTRVRSE 3X40X130C (BALLOONS) ×3
BALLN ULTRVRSE 4X300X130 (BALLOONS) ×3
BALLOON ULTRVRSE 3X40X130C (BALLOONS) ×1 IMPLANT
BALLOON ULTRVRSE 4X300X130 (BALLOONS) ×1 IMPLANT
CATH BEACON 5 .035 65 RIM TIP (CATHETERS) ×3 IMPLANT
CATH CROSSER 14S OTW 146CM (CATHETERS) ×3 IMPLANT
CATH CXI SUPP ANG 4FR 135 (MICROCATHETER) ×1 IMPLANT
CATH CXI SUPP ANG 4FR 135CM (MICROCATHETER) ×3
CATH PIG 70CM (CATHETERS) ×3 IMPLANT
CATH SIDEKICK ANG 110 GEOALIGN (CATHETERS) ×3 IMPLANT
DEVICE STARCLOSE SE CLOSURE (Vascular Products) ×3 IMPLANT
DEVICE TORQUE (MISCELLANEOUS) ×3 IMPLANT
GLIDECATH ANGLED 4FR 120CM (CATHETERS) ×3 IMPLANT
GLIDEWIRE ADV .035X260CM (WIRE) ×3 IMPLANT
GUIDEWIRE LT ZIPWIRE 035X260 (WIRE) ×3 IMPLANT
GUIDEWIRE PFTE-COATED .018X300 (WIRE) ×3 IMPLANT
KIT FLOWMATE PROCEDURAL (MISCELLANEOUS) ×3 IMPLANT
NEEDLE ENTRY 21GA 7CM ECHOTIP (NEEDLE) ×3 IMPLANT
PACK ANGIOGRAPHY (CUSTOM PROCEDURE TRAY) ×3 IMPLANT
SET INTRO CAPELLA COAXIAL (SET/KITS/TRAYS/PACK) ×3 IMPLANT
SHEATH BRITE TIP 5FRX11 (SHEATH) ×3 IMPLANT
SHEATH RAABE 7FR (SHEATH) ×3 IMPLANT
SHIELD X-DRAPE GOLD 12X17 (MISCELLANEOUS) ×3 IMPLANT
TUBING CONTRAST HIGH PRESS 72 (TUBING) ×3 IMPLANT
WIRE J 3MM .035X145CM (WIRE) ×3 IMPLANT

## 2017-08-21 NOTE — H&P (Signed)
[facility] History & Physical Update  The patient was interviewed and re-examined.  The patient's previous History and Physical has been reviewed and is unchanged.  There is no change in the plan of care. We plan to proceed with the scheduled procedure.  Levora DredgeGregory Schnier, MD  08/21/2017, 11:23 AM

## 2017-08-21 NOTE — Op Note (Signed)
[facility] Percutaneous Study/Intervention Procedural Note   Date of Surgery: 08/21/2017  Surgeon:  Renford DillsGregory G. Schnier, MD.  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 Introduction catheter into right lower extremity 3rd order catheter placement  2. Contrast injection right lower extremity for distal runoff   3. Crosser atherectomy of the right SFA and popliteal arteries 4.  Percutaneous transluminal angioplasty right superficial femoral artery and popliteal to 4 mm             5.   Star close closure left common femoral arteriotomy  Anesthesia: Conscious sedation was administered by the radiology RN under my direct supervision. IV Versed plus fentanyl were utilized. Continuous ECG, pulse oximetry and blood pressure was monitored throughout the entire procedure. Conscious sedation was for a total of 86 minutes.  Sheath: 7 [place] Ansell left common femoral  Contrast: 90 cc  Fluoroscopy Time: 22.6 minutes  Indications: [name] FleetKatie L Bennion presents with ulceration of the right foot.  Physical examination and noninvasive studies demonstrate significant atherosclerotic occlusive disease with flat Doppler signals and toe tracings bilaterally.  This represents limb threatening ischemia.  The risks and benefits are reviewed all questions answered patient agrees to proceed.  Procedure: [name] FleetKatie L [name] is a 84 y.o. y.o. female who was identified and appropriate procedural time out was performed. The patient was then placed supine on the table and prepped and draped in the usual sterile fashion.   Ultrasound was placed in the sterile sleeve and the left groin was evaluated the left common femoral artery was echolucent and pulsatile indicating patency.  Image was recorded for the  permanent record and under real-time visualization a microneedle was inserted into the common femoral artery microwire followed by a micro-sheath.  A J-wire was then advanced through the micro-sheath and a  5 [place] sheath was then inserted over a J-wire. J-wire was then advanced and a 5 French pigtail catheter was positioned at the level of T12. AP projection of the aorta was then obtained. Pigtail catheter was repositioned to above the bifurcation and a LAO view of the pelvis was obtained.  Subsequently a rim catheter with the stiff angle Glidewire was used to cross the aortic bifurcation the catheter wire were advanced down into the right distal external iliac artery. Oblique view of the femoral bifurcation was then obtained and subsequently the wire was reintroduced and the pigtail catheter negotiated into the SFA representing third order catheter placement. Distal runoff was then performed.  5000 units of heparin was then given and allowed to circulate and a 7 [place] Ansell sheath was advanced up and over the bifurcation and positioned in the femoral artery  The 14 S Crosser catheter was then prepped on the field and a straight side kick catheter was advanced into the cul-de-sac of the SFA under magnified imaging in the LAO projection. Using the Crosser catheter the occlusion of the SFA and popliteal was negotiated.  Injection of contrast through the Crosser side port confirmed intraluminal positioning.  Angled glide catheter and stiff angle Glidewire were then advanced down into the distal popliteal.  Distal runoff was then completed by hand injection through the catheter.  A 4 mm x 30 cm Ultraverse balloon was used to angioplasty the superficial femoral and popliteal arteries. Inflations were to 14 atmospheres for 2 minutes. Follow-up imaging demonstrated patency of the SFA and popliteal now filling the trifurcation.  Distal runoff was then reassessed.  This demonstrated the anterior tibial posterior  tibial are occluded peroneal is patent.  The does appear to be disease within the tibioperoneal trunk.  Attempts at crossing this lesion resulted in AV fistula that obscured visualization and therefore given the recanalization of the entire SFA and popliteal as well as the contrast load that she had received and her age I elected to terminate the procedure at this time.  I will recheck her ABIs and if they are not significantly improved will plan to revascularize the peroneal as well.  After review of these images the sheath is pulled into the left external iliac oblique of the common femoral is obtained and a Star close device deployed. There no immediate Complications.  Findings: The abdominal aorta is opacified with a bolus injection contrast. Renal arteries are severely stenotic bilaterally with greater than 70% stenosis. The aorta itself has diffuse disease but no hemodynamically significant lesions. The common and external iliac arteries are widely patent bilaterally.  The right common femoral is widely patent as is the profunda femoris.  The SFA does indeed have a significant stenosis throughout its course with several areas of occlusion this is true within the proximal and mid popliteal as well.  The distal popliteal demonstrates increasing disease and the trifurcation is heavily diseased with occlusion of the anterior tibial and posterior tibial.  The peroneal demonstrates a subtotal near its origin but is otherwise patent down to the ankle.    Angioplasty of the SFA and popliteal yields an excellent result with less than 15% residual stenosis.  As noted above:  Given the recanalization of the entire SFA and popliteal as well as the contrast load that she had received and her age I elected to terminate the procedure at this time.  I will recheck her ABIs and if they are not significantly improved will plan to revascularize the peroneal as well.    Disposition: Patient  was taken to the recovery room in stable condition having tolerated the procedure well.  Earl Lites Schnier 08/21/2017,6:20 PM

## 2017-08-22 ENCOUNTER — Encounter: Payer: Self-pay | Admitting: Vascular Surgery

## 2017-08-24 ENCOUNTER — Ambulatory Visit: Payer: Self-pay | Admitting: Physician Assistant

## 2017-08-24 ENCOUNTER — Ambulatory Visit
Admission: RE | Admit: 2017-08-24 | Discharge: 2017-08-24 | Disposition: A | Discharge status: Home / Self Care | Payer: Medicare Other | Source: Ambulatory Visit | Attending: Physician Assistant | Admitting: Physician Assistant

## 2017-08-24 DIAGNOSIS — X58XXXA Exposure to other specified factors, initial encounter: Secondary | ICD-10-CM | POA: Insufficient documentation

## 2017-08-24 DIAGNOSIS — M7989 Other specified soft tissue disorders: Secondary | ICD-10-CM | POA: Diagnosis not present

## 2017-08-24 DIAGNOSIS — S91104A Unspecified open wound of right lesser toe(s) without damage to nail, initial encounter: Secondary | ICD-10-CM | POA: Diagnosis not present

## 2017-08-24 DIAGNOSIS — M868X7 Other osteomyelitis, ankle and foot: Secondary | ICD-10-CM | POA: Insufficient documentation

## 2017-08-24 DIAGNOSIS — S81809A Unspecified open wound, unspecified lower leg, initial encounter: Secondary | ICD-10-CM

## 2017-09-07 ENCOUNTER — Encounter: Payer: Medicare Other | Attending: Physician Assistant | Admitting: Physician Assistant

## 2017-09-07 DIAGNOSIS — L97512 Non-pressure chronic ulcer of other part of right foot with fat layer exposed: Secondary | ICD-10-CM | POA: Diagnosis present

## 2017-09-07 DIAGNOSIS — M1990 Unspecified osteoarthritis, unspecified site: Secondary | ICD-10-CM | POA: Insufficient documentation

## 2017-09-07 DIAGNOSIS — I10 Essential (primary) hypertension: Secondary | ICD-10-CM | POA: Insufficient documentation

## 2017-09-07 DIAGNOSIS — M109 Gout, unspecified: Secondary | ICD-10-CM | POA: Insufficient documentation

## 2017-09-07 DIAGNOSIS — I739 Peripheral vascular disease, unspecified: Secondary | ICD-10-CM | POA: Diagnosis not present

## 2017-09-07 DIAGNOSIS — F0390 Unspecified dementia without behavioral disturbance: Secondary | ICD-10-CM | POA: Diagnosis not present

## 2017-09-11 NOTE — Progress Notes (Signed)
[name], [name] ([id number]) Visit Report for 09/07/2017 Chief Complaint Document Details Patient Name: [name], [name]. Date of Service: 09/07/2017 10:30 AM Medical Record Number: [id number] Patient Account Number: [id number] Date of Birth/Sex: 1921/06/22 (84 y.o. Female) Treating RN: Curtis Sites Primary Care Provider: Dewaine Oats Other Clinician: Referring Provider: Dewaine Oats Treating Provider/Extender: Linwood Dibbles, HOYT Weeks in Treatment: 22 Information Obtained from: Patient Chief Complaint Patient seen for complaints of Non-Healing Wound to the right 1st through 4th toes Electronic Signature(s) Signed: 09/08/2017 12:37:14 PM By: Lenda Kelp PA-C Entered By: Lenda Kelp on 09/08/2017 10:58:13 [name], [name] ([id number]) -------------------------------------------------------------------------------- HPI Details Patient Name: [name] Bayley L. Date of Service: 09/07/2017 10:30 AM Medical Record Number: [id number] Patient Account Number: [id number] Date of Birth/Sex: 07/28/1920 (84 y.o. Female) Treating RN: Curtis Sites Primary Care Provider: Dewaine Oats Other Clinician: Referring Provider: Dewaine Oats Treating Provider/Extender: STONE III, HOYT Weeks in Treatment: 22 History of Present Illness HPI Description: very pleasant 84 year old patient, no history of diabetes or smoking has had a ulceration on the right second toe dorsum for about 3 months. Besides essential hypertension and gout she does not have any other significant problems and has never been a smoker. 04/13/17 on evaluation today patient appears to be doing about the same in regard to her right second toe ulcer. We did get the results for x-ray which showed osteopenia without any specific bone abnormality osteomyelitis. She does continue to have discomfort with palpation of the wound although secondary to mental status is unable to rate or describe this pain. No fevers, chills, nausea, or vomiting noted at this  time. 04/27/2017 -- over the last couple of weeks the patient has had some issues with a urinary tract infection and some mental status changes and after review today, I have recommended we do not do the arterial studies recommended by my colleague last week, in view of the fact that the patient's wound has improved considerably and I do not want to put her through further investigations, which may not change the treatment plan. 05/04/2017 -- the patient was seen by her podiatrist and also has a CT scan pending to workup her recent mental status changes. 05/13/17 on evaluation today patient appears to be doing about the same. The wound is not significantly smaller although there is more granulation than when I last saw this. We are continuing to manage this more conservatively as patient's family does not want to delve deeper into a lot of testing they would prefer to see how the wound progresses. Fortunately there is no evidence of infection. No fevers, chills, nausea, or vomiting noted at this time. She has been tolerating the dressing changes without complication and has only minimal discomfort that she is unable to rate or describe her pain secondary to mental status. 06/15/2017 -- 84 year old patient with significant dementia is being very well taken care of by her caregivers but the big plan is for palliative care only and no aggressive treatment is to be done. 06/29/2017 -- the caregiver confirms that the family and the power of attorney did not want any aggressive treatment to be done and we will continue with palliative care. She is a bit brighter today and seems to be more oriented in time and place. 07/13/2017 -- 84 year old with a open wound on the right second toe dorsum probing down to bone has had a stable toe without any evidence of cellulitis and the power of attorney does not want any aggressive treatment to be done  unless absolutely essential. We have been giving her palliative  care for a while and today she is very awake and alert and seems well oriented in time and space. 07/27/17 on evaluation today patient unfortunately has two new ulcers one over the medial fourth toe and one on the lateral second toe just adjacent to the main ulcerated lesion. This almost appears to be possibly moisture associated breakdown although they are very circular in nature which does lend itself to an arterial insufficiency ulcer. There does not appear to be any purulent discharge from any site at this point. Patient did see Dr. Alberteen Spindle recently and he has recommended proceeding with the arterial studies at this point so that if any type of amputation is necessary they will know what needs to be done without having to guess or go and blindly. Obviously I think this is a good idea as it's possible that amputation may become necessary. He also placed her on Augmentin for six weeks. 08/10/17 on evaluation today patient appears to be doing about the same in regard to her right foot ulcers. Unfortunately the new thing that is noted is that she has an ulcer on the medial aspect of her third toe. The second toe ulcer on the dorsal aspect appears to be about the same although I'm not sure that she has not in the interim had a dorsal tendon rupture as the distal portion of her toe seems to be very loose almost as if the tendon is no longer attached on the dorsal aspect. There is [name], [name] ([id number]) also bone exposed obviously in the wound bed. she has not had her vascular procedure yet although she does have this on February 5 which is an angiogram. There are going to see if any intervention can be attempted in order to improve her blood flow to the right lower extremity. In the meantime she has been tolerating the dressing changes decently well she does have a lot of pain according to the power of attorney and caregiver who is with her today. 09/07/17 on evaluation today patient appears to be  doing worse in regard to her right foot ulcers. She continues to have the ulceration of the second toe although she now has ulcers on the lateral first toe, medial third toe, and medial fourth toe. Obviously this seems to be getting worse and I really do not believe these are pressure areas but rather arterial ulcers. She did have the vascular procedure but unfortunately she had plaque throughout the leg and the procedure was going on so long that it was terminated by the vascular surgeon even though she still has significant blockages below the knee. He was afraid that she was getting too much contrast dye especially considering her age. The plan has been to potentially go back in and further trying to open up blood flow to the extremity in future weeks although her appointment is not until the fourth or fifth of March 2 see the the surgeon back just for reevaluation and then they have to schedule the procedure if there are gonna proceed with another procedure. In the meantime her foot really does not appear to be doing very well at all. She did have a posterior tibial pulse that was not palpable but rather Dopplerable. However I could not get a Doppler signal in the dorsalis pedis region at all. There was also no palpable pulse. I do believe that we are coming close to needing to make a decision either to  potentially proceed with amputation or else potentially to continue with palliative care. I did review patient's MRI results which revealed osteomyelitis of the right second toe. I did also review the vascular note in regard to the revascularization. It does appear that they were able to get quite a bit done in the upper leg although the lower leg has not really been addressed at this point. This again is on the right. Electronic Signature(s) Signed: 09/08/2017 12:37:14 PM By: Lenda Kelp PA-C Entered By: Lenda Kelp on 09/08/2017 11:23:37 Plotz, [name]  ([id number]) -------------------------------------------------------------------------------- Physical Exam Details Patient Name: Waltermire, Elexa L. Date of Service: 09/07/2017 10:30 AM Medical Record Number: [id number] Patient Account Number: [id number] Date of Birth/Sex: 1920/12/29 (84 y.o. Female) Treating RN: Curtis Sites Primary Care Provider: Dewaine Oats Other Clinician: Referring Provider: TATE, Katherina Right Treating Provider/Extender: STONE III, HOYT Weeks in Treatment: 22 Constitutional Well-nourished and well-hydrated in no acute distress. Respiratory normal breathing without difficulty. clear to auscultation bilaterally. Cardiovascular regular rate and rhythm with normal S1, S2. 1+ pitting edema of the right knee. Psychiatric Patient is not able to cooperate in decision making regarding care. Patient has dementia. pleasant and cooperative. Notes Patient's wounds really do not seem to be doing any better. Unfortunately she also has osteomyelitis noted in regard to the right second toe. Obviously this is definitely not good for the patient in general. Electronic Signature(s) Signed: 09/08/2017 12:37:14 PM By: Lenda Kelp PA-C Entered By: Lenda Kelp on 09/08/2017 11:24:49 Hargan, [name] ([id number]) -------------------------------------------------------------------------------- Physician Orders Details Patient Name: [name] Bayley L. Date of Service: 09/07/2017 10:30 AM Medical Record Number: [id number] Patient Account Number: [id number] Date of Birth/Sex: 1920-10-29 (84 y.o. Female) Treating RN: Curtis Sites Primary Care Provider: Dewaine Oats Other Clinician: Referring Provider: TATE, Katherina Right Treating Provider/Extender: STONE III, HOYT Weeks in Treatment: 22 Verbal / Phone Orders: No Diagnosis Coding Wound Cleansing Wound #1 Right Toe Second o Clean wound with Normal Saline. Wound #2 Right Toe Great o Clean wound with Normal Saline. Wound #3 Right Toe Third o  Clean wound with Normal Saline. Wound #4 Right Toe Fourth o Clean wound with Normal Saline. Anesthetic (add to Medication List) Wound #1 Right Toe Second o Topical Lidocaine 4% cream applied to wound bed prior to debridement (In Clinic Only). Wound #2 Right Toe Great o Topical Lidocaine 4% cream applied to wound bed prior to debridement (In Clinic Only). Wound #3 Right Toe Third o Topical Lidocaine 4% cream applied to wound bed prior to debridement (In Clinic Only). Wound #4 Right Toe Fourth o Topical Lidocaine 4% cream applied to wound bed prior to debridement (In Clinic Only). Primary Wound Dressing Wound #1 Right Toe Second o Silvercel Non-Adherent Wound #2 Right Toe Great o Silvercel Non-Adherent Wound #3 Right Toe Third o Silvercel Non-Adherent Wound #4 Right Toe Fourth o Silvercel Non-Adherent Dressing Change Frequency Wound #1 Right Toe Second o Other: - change the bandage on shower days or PRN if soiled Wound #2 Right Toe Samayra Hebel, Ervin L. ([id number]) o Other: - change the bandage on shower days or PRN if soiled Wound #3 Right Toe Third o Other: - change the bandage on shower days or PRN if soiled Wound #4 Right Toe Fourth o Other: - change the bandage on shower days or PRN if soiled Follow-up Appointments Wound #1 Right Toe Second o Return Appointment in 2 weeks. Wound #2 Right Toe Great o Return Appointment in 2 weeks. Wound #3 Right Toe Third o Return Appointment  in 2 weeks. Wound #4 Right Toe Fourth o Return Appointment in 2 weeks. Off-Loading Wound #1 Right Toe Second o Other: - continue wearing shoes that do not rub the top of the toe Wound #2 Right Toe Great o Other: - continue wearing shoes that do not rub the top of the toe Wound #3 Right Toe Third o Other: - continue wearing shoes that do not rub the top of the toe Wound #4 Right Toe Fourth o Other: - continue wearing shoes that do not rub the top of the  toe Additional Orders / Instructions Wound #1 Right Toe Second o Increase protein intake. o Other: - Please add vitamin A, vitamin C and zinc supplements to your diet Wound #2 Right Toe Great o Increase protein intake. o Other: - Please add vitamin A, vitamin C and zinc supplements to your diet Wound #3 Right Toe Third o Increase protein intake. o Other: - Please add vitamin A, vitamin C and zinc supplements to your diet Wound #4 Right Toe Fourth o Increase protein intake. o Other: - Please add vitamin A, vitamin C and zinc supplements to your diet Electronic Signature(s) Signed: 09/07/2017 5:21:02 PM By: Curtis Sites Signed: 09/08/2017 12:37:14 PM By: Margot Chimes, Thora L. ([id number]) Entered By: Curtis Sites on 09/07/2017 11:34:45 Mudgett, Taline L. ([id number]) -------------------------------------------------------------------------------- Progress Note Details Patient Name: Culbertson, Marigrace L. Date of Service: 09/07/2017 10:30 AM Medical Record Number: [id number] Patient Account Number: [id number] Date of Birth/Sex: 1921-07-04 (84 y.o. Female) Treating RN: Curtis Sites Primary Care Provider: TATE, Katherina Right Other Clinician: Referring Provider: Dewaine Oats Treating Provider/Extender: Linwood Dibbles, HOYT Weeks in Treatment: 22 Subjective Chief Complaint Information obtained from Patient Patient seen for complaints of Non-Healing Wound to the right 1st through 4th toes History of Present Illness (HPI) very pleasant 84 year old patient, no history of diabetes or smoking has had a ulceration on the right second toe dorsum for about 3 months. Besides essential hypertension and gout she does not have any other significant problems and has never been a smoker. 04/13/17 on evaluation today patient appears to be doing about the same in regard to her right second toe ulcer. We did get the results for x-ray which showed osteopenia without any specific bone abnormality  osteomyelitis. She does continue to have discomfort with palpation of the wound although secondary to mental status is unable to rate or describe this pain. No fevers, chills, nausea, or vomiting noted at this time. 04/27/2017 -- over the last couple of weeks the patient has had some issues with a urinary tract infection and some mental status changes and after review today, I have recommended we do not do the arterial studies recommended by my colleague last week, in view of the fact that the patient's wound has improved considerably and I do not want to put her through further investigations, which may not change the treatment plan. 05/04/2017 -- the patient was seen by her podiatrist and also has a CT scan pending to workup her recent mental status changes. 05/13/17 on evaluation today patient appears to be doing about the same. The wound is not significantly smaller although there is more granulation than when I last saw this. We are continuing to manage this more conservatively as patient's family does not want to delve deeper into a lot of testing they would prefer to see how the wound progresses. Fortunately there is no evidence of infection. No fevers, chills, nausea, or vomiting noted at this time. She has been  tolerating the dressing changes without complication and has only minimal discomfort that she is unable to rate or describe her pain secondary to mental status. 06/15/2017 -- 84 year old patient with significant dementia is being very well taken care of by her caregivers but the big plan is for palliative care only and no aggressive treatment is to be done. 06/29/2017 -- the caregiver confirms that the family and the power of attorney did not want any aggressive treatment to be done and we will continue with palliative care. She is a bit brighter today and seems to be more oriented in time and place. 07/13/2017 -- 84 year old with a open wound on the right second toe dorsum probing  down to bone has had a stable toe without any evidence of cellulitis and the power of attorney does not want any aggressive treatment to be done unless absolutely essential. We have been giving her palliative care for a while and today she is very awake and alert and seems well oriented in time and space. 07/27/17 on evaluation today patient unfortunately has two new ulcers one over the medial fourth toe and one on the lateral second toe just adjacent to the main ulcerated lesion. This almost appears to be possibly moisture associated breakdown although they are very circular in nature which does lend itself to an arterial insufficiency ulcer. There does not appear to be any purulent discharge from any site at this point. Patient did see Dr. Alberteen Spindle recently and he has recommended proceeding with the arterial studies at this point so that if any type of amputation is necessary they will know what needs to be done without having to guess or go and blindly. Obviously I think this is a good idea as it's possible that amputation may become Cowley, Martina L. ([id number]) necessary. He also placed her on Augmentin for six weeks. 08/10/17 on evaluation today patient appears to be doing about the same in regard to her right foot ulcers. Unfortunately the new thing that is noted is that she has an ulcer on the medial aspect of her third toe. The second toe ulcer on the dorsal aspect appears to be about the same although I'm not sure that she has not in the interim had a dorsal tendon rupture as the distal portion of her toe seems to be very loose almost as if the tendon is no longer attached on the dorsal aspect. There is also bone exposed obviously in the wound bed. she has not had her vascular procedure yet although she does have this on February 5 which is an angiogram. There are going to see if any intervention can be attempted in order to improve her blood flow to the right lower extremity. In the meantime  she has been tolerating the dressing changes decently well she does have a lot of pain according to the power of attorney and caregiver who is with her today. 09/07/17 on evaluation today patient appears to be doing worse in regard to her right foot ulcers. She continues to have the ulceration of the second toe although she now has ulcers on the lateral first toe, medial third toe, and medial fourth toe. Obviously this seems to be getting worse and I really do not believe these are pressure areas but rather arterial ulcers. She did have the vascular procedure but unfortunately she had plaque throughout the leg and the procedure was going on so long that it was terminated by the vascular surgeon even though she still has significant blockages  below the knee. He was afraid that she was getting too much contrast dye especially considering her age. The plan has been to potentially go back in and further trying to open up blood flow to the extremity in future weeks although her appointment is not until the fourth or fifth of March 2 see the the surgeon back just for reevaluation and then they have to schedule the procedure if there are gonna proceed with another procedure. In the meantime her foot really does not appear to be doing very well at all. She did have a posterior tibial pulse that was not palpable but rather Dopplerable. However I could not get a Doppler signal in the dorsalis pedis region at all. There was also no palpable pulse. I do believe that we are coming close to needing to make a decision either to potentially proceed with amputation or else potentially to continue with palliative care. I did review patient's MRI results which revealed osteomyelitis of the right second toe. I did also review the vascular note in regard to the revascularization. It does appear that they were able to get quite a bit done in the upper leg although the lower leg has not really been addressed at this point.  This again is on the right. Patient History Information obtained from Patient. Social History Never smoker, Marital Status - Widowed, Alcohol Use - Never, Drug Use - No History, Caffeine Use - Daily. Review of Systems (ROS) Constitutional Symptoms (General Health) Denies complaints or symptoms of Fever, Chills. Respiratory The patient has no complaints or symptoms. Cardiovascular Complains or has symptoms of LE edema. Psychiatric The patient has no complaints or symptoms. Objective Constitutional Well-nourished and well-hydrated in no acute distress. Vitals Time Taken: 10:56 AM, Height: 59 in, Source: Stated, Weight: 101 lbs, Source: Stated, BMI: 20.4, Temperature: 97.7  [name], [name] ([id number]) F, Pulse: 42 bpm, Respiratory Rate: 16 breaths/min, Blood Pressure: 109/83 mmHg. Respiratory normal breathing without difficulty. clear to auscultation bilaterally. Cardiovascular regular rate and rhythm with normal S1, S2. 1+ pitting edema of the right knee. Psychiatric Patient is not able to cooperate in decision making regarding care. Patient has dementia. pleasant and cooperative. General Notes: Patient's wounds really do not seem to be doing any better. Unfortunately she also has osteomyelitis noted in regard to the right second toe. Obviously this is definitely not good for the patient in general. Integumentary (Hair, Skin) Wound #1 status is Open. Original cause of wound was Gradually Appeared. The wound is located on the Right Toe Second. The wound measures 0.7cm length x 0.4cm width x 0.2cm depth; 0.22cm^2 area and 0.044cm^3 volume. There is Fat Layer (Subcutaneous Tissue) Exposed exposed. There is no tunneling or undermining noted. There is a medium amount of serosanguineous drainage noted. There is small (1-33%) red granulation within the wound bed. There is a large (67-100%) amount of necrotic tissue within the wound bed including Adherent Slough. The periwound skin  appearance exhibited: Dry/Scaly. The periwound skin appearance did not exhibit: Callus, Crepitus, Excoriation, Induration, Rash, Scarring, Maceration, Atrophie Blanche, Cyanosis, Ecchymosis, Hemosiderin Staining, Mottled, Pallor, Rubor, Erythema. Periwound temperature was noted as No Abnormality. The periwound has tenderness on palpation. Wound #2 status is Open. Original cause of wound was Gradually Appeared. The wound is located on the Right Toe Great. The wound measures 0.7cm length x 0.5cm width x 0.1cm depth; 0.275cm^2 area and 0.027cm^3 volume. There is Fat Layer (Subcutaneous Tissue) Exposed exposed. There is no tunneling or undermining noted. There is a medium  amount of serosanguineous drainage noted. There is medium (34-66%) red, pink granulation within the wound bed. There is a medium (34-66%) amount of necrotic tissue within the wound bed including Adherent Slough. The periwound skin appearance exhibited: Callus. The periwound skin appearance did not exhibit: Crepitus, Excoriation, Induration, Rash, Scarring, Dry/Scaly, Maceration, Atrophie Blanche, Cyanosis, Ecchymosis, Hemosiderin Staining, Mottled, Pallor, Rubor, Erythema. Wound #3 status is Open. Original cause of wound was Gradually Appeared. The wound is located on the Right Toe Third. The wound measures 0.4cm length x 0.4cm width x 0.1cm depth; 0.126cm^2 area and 0.013cm^3 volume. There is Fat Layer (Subcutaneous Tissue) Exposed exposed. There is no tunneling or undermining noted. There is a medium amount of serosanguineous drainage noted. The wound margin is flat and intact. There is large (67-100%) red, pink granulation within the wound bed. There is a small (1-33%) amount of necrotic tissue within the wound bed including Adherent Slough. The periwound skin appearance exhibited: Callus. The periwound skin appearance did not exhibit: Crepitus, Excoriation, Induration, Rash, Scarring, Dry/Scaly, Maceration, Atrophie Blanche,  Cyanosis, Ecchymosis, Hemosiderin Staining, Mottled, Pallor, Rubor, Erythema. Wound #4 status is Open. Original cause of wound was Gradually Appeared. The wound is located on the Right Toe Fourth. The wound measures 0.6cm length x 0.5cm width x 0.1cm depth; 0.236cm^2 area and 0.024cm^3 volume. There is Fat Layer (Subcutaneous Tissue) Exposed exposed. There is no tunneling or undermining noted. There is a medium amount of serosanguineous drainage noted. The wound margin is flat and intact. There is medium (34-66%) red, pink granulation within the wound bed. There is a medium (34-66%) amount of necrotic tissue within the wound bed. The periwound skin appearance did not exhibit: Callus, Crepitus, Excoriation, Induration, Rash, Scarring, Dry/Scaly, Maceration, Atrophie Blanche, Cyanosis, Ecchymosis, Hemosiderin Staining, Mottled, Pallor, Rubor, Erythema. Periwound temperature was noted as No Abnormality. Plan Wound Cleansing: Wound #1 Right Toe Second: Mruk, Chevette L. ([id number]) Clean wound with Normal Saline. Wound #2 Right Toe Great: Clean wound with Normal Saline. Wound #3 Right Toe Third: Clean wound with Normal Saline. Wound #4 Right Toe Fourth: Clean wound with Normal Saline. Anesthetic (add to Medication List): Wound #1 Right Toe Second: Topical Lidocaine 4% cream applied to wound bed prior to debridement (In Clinic Only). Wound #2 Right Toe Great: Topical Lidocaine 4% cream applied to wound bed prior to debridement (In Clinic Only). Wound #3 Right Toe Third: Topical Lidocaine 4% cream applied to wound bed prior to debridement (In Clinic Only). Wound #4 Right Toe Fourth: Topical Lidocaine 4% cream applied to wound bed prior to debridement (In Clinic Only). Primary Wound Dressing: Wound #1 Right Toe Second: Silvercel Non-Adherent Wound #2 Right Toe Great: [place]cel Non-Adherent Wound #3 Right Toe Third: [place] Non-Adherent Wound #4 Right Toe Fourth: Silvercel  Non-Adherent Dressing Change Frequency: Wound #1 Right Toe Second: Other: - change the bandage on shower days or PRN if soiled Wound #2 Right Toe Great: Other: - change the bandage on shower days or PRN if soiled Wound #3 Right Toe Third: Other: - change the bandage on shower days or PRN if soiled Wound #4 Right Toe Fourth: Other: - change the bandage on shower days or PRN if soiled Follow-up Appointments: Wound #1 Right Toe Second: Return Appointment in 2 weeks. Wound #2 Right Toe Great: Return Appointment in 2 weeks. Wound #3 Right Toe Third: Return Appointment in 2 weeks. Wound #4 Right Toe Fourth: Return Appointment in 2 weeks. Off-Loading: Wound #1 Right Toe Second: Other: - continue wearing shoes that do not rub  the top of the toe Wound #2 Right Toe Great: Other: - continue wearing shoes that do not rub the top of the toe Wound #3 Right Toe Third: Other: - continue wearing shoes that do not rub the top of the toe Wound #4 Right Toe Fourth: Other: - continue wearing shoes that do not rub the top of the toe Additional Orders / Instructions: Wound #1 Right Toe Second: Increase protein intake. Other: - Please add vitamin A, vitamin C and zinc supplements to your diet Wound #2 Right Toe Great: Increase protein intake. Other: - Please add vitamin A, vitamin C and zinc supplements to your diet Wound #3 Right Toe Third: Nuckles, Alyla L. ([id number]) Increase protein intake. Other: - Please add vitamin A, vitamin C and zinc supplements to your diet Wound #4 Right Toe Fourth: Increase protein intake. Other: - Please add vitamin A, vitamin C and zinc supplements to your diet Currently I'm going to suggest that she continue with the Augmentin which Dr. Graciela Husbands has Artie prescribed for the time being. We are going to make a referral to infectious disease regarding the right second toe osteomyelitis. We will see what they recommended that point. Up quickly patient also sees Dr. Graciela Husbands  her podiatrist which again I do believe is still appropriate at this point. We will see were things stand in one weeks time. With that being said I think we do need to have an actual discussion with the power of attorney in particular to decide where we want to go although they would like to hold off on the discussion until after the patient sees vascular which is on the fourth or fifth of March coming up in the next couple weeks. In the meantime we will see were things stand in one weeks time. Electronic Signature(s) Signed: 09/08/2017 12:37:14 PM By: Lenda Kelp PA-C Entered By: Lenda Kelp on 09/08/2017 11:26:02 Ruis, [name] ([id number]) -------------------------------------------------------------------------------- ROS/PFSH Details Patient Name: [name] Bayley L. Date of Service: 09/07/2017 10:30 AM Medical Record Number: [id number] Patient Account Number: [id number] Date of Birth/Sex: 05-30-1921 (84 y.o. Female) Treating RN: Curtis Sites Primary Care Provider: TATE, Katherina Right Other Clinician: Referring Provider: Dewaine Oats Treating Provider/Extender: Linwood Dibbles, HOYT Weeks in Treatment: 22 Information Obtained From Patient Wound History Do you currently have one or more open woundso Yes How many open wounds do you currently haveo 1 Approximately how long have you had your woundso 3 months How have you been treating your wound(s) until nowo neosporin Has your wound(s) ever healed and then re-openedo No Have you had any lab work done in the past montho No Have you tested positive for an antibiotic resistant organism (MRSA, VRE)o No Have you tested positive for osteomyelitis (bone infection)o No Have you had any tests for circulation on your legso No Constitutional Symptoms (General Health) Complaints and Symptoms: Negative for: Fever; Chills Cardiovascular Complaints and Symptoms: Positive for: LE edema Medical History: Positive for: Hypertension Respiratory Complaints  and Symptoms: No Complaints or Symptoms Musculoskeletal Medical History: Positive for: Gout; Osteoarthritis Psychiatric Complaints and Symptoms: No Complaints or Symptoms Immunizations Pneumococcal Vaccine: Received Pneumococcal Vaccination: Yes Immunization Notes: up to date Implantable Devices ILMA, ACHEE ([id number]) Family and Social History Never smoker; Marital Status - Widowed; Alcohol Use: Never; Drug Use: No History; Caffeine Use: Daily; Financial Concerns: No; Food, Clothing or Shelter Needs: No; Support System Lacking: No; Transportation Concerns: No; Advanced Directives: Yes (Not Provided); Patient does not want information on Advanced Directives; Medical Power of Attorney:  Yes - Ardeen Jourdain - niece (Not Provided) Physician Affirmation I have reviewed and agree with the above information. Electronic Signature(s) Signed: 09/08/2017 12:37:14 PM By: Lenda Kelp PA-C Signed: 09/10/2017 4:35:46 PM By: Curtis Sites Entered By: Lenda Kelp on 09/08/2017 11:22:15 Sahr, Blyss LMarland Kitchen ([id number]) -------------------------------------------------------------------------------- SuperBill Details Patient Name: Shan, Baylee L. Date of Service: 09/07/2017 Medical Record Number: [id number] Patient Account Number: [id number] Date of Birth/Sex: Nov 24, 1920 (84 y.o. Female) Treating RN: Curtis Sites Primary Care Provider: Dewaine Oats Other Clinician: Referring Provider: Dewaine Oats Treating Provider/Extender: STONE III, HOYT Weeks in Treatment: 22 Diagnosis Coding ICD-10 Codes Code Description L97.512 Non-pressure chronic ulcer of other part of right foot with fat layer exposed I73.9 Peripheral vascular disease, unspecified I10 Essential (primary) hypertension Facility Procedures CPT4 Code: [id number] Description: 95284 - WOUND CARE VISIT-LEV 5 EST PT Modifier: Quantity: 1 Physician Procedures CPT4 Code: [id number] Description: 99213 - WC PHYS LEVEL 3 - EST PT ICD-10  Diagnosis Description L97.512 Non-pressure chronic ulcer of other part of right foot with fat I73.9 Peripheral vascular disease, unspecified I10 Essential (primary) hypertension Modifier: layer exposed Quantity: 1 Electronic Signature(s) Signed: 09/08/2017 12:37:14 PM By: Lenda Kelp PA-C Entered By: Lenda Kelp on 09/08/2017 11:26:19

## 2017-09-13 NOTE — Progress Notes (Signed)
MECHELL, GIRGIS ([id number]) Visit Report for 09/07/2017 Arrival Information Details Patient Name: [name] Date of Service: 09/07/2017 10:30 AM Medical Record Number: [id number] Patient Account Number: [id number] Date of Birth/Sex: 08-Oct-1920 (84 y.o. Female) Treating RN: Renne Crigler Primary Care Mackenzie Lia: Dewaine Oats Other Clinician: Referring Arhaan Chesnut: Dewaine Oats Treating Maciel Kegg/Extender: Linwood Dibbles, HOYT Weeks in Treatment: 22 Visit Information History Since Last Visit All ordered tests and consults were completed: No Patient Arrived: Wheel Chair Added or deleted any medications: No Arrival Time: 10:54 Any new allergies or adverse reactions: No Accompanied By: caregiver Had a fall or experienced change in No activities of daily living that may affect Transfer Assistance: None risk of falls: Patient Identification Verified: Yes Signs or symptoms of abuse/neglect since last visito No Secondary Verification Process Completed: Yes Hospitalized since last visit: No Patient Requires Transmission-Based No Pain Present Now: No Precautions: Patient Has Alerts: Yes Electronic Signature(s) Signed: 09/07/2017 4:58:29 PM By: Renne Crigler Entered By: Renne Crigler on 09/07/2017 10:56:03 Hannis, Sayana L. ([id number]) -------------------------------------------------------------------------------- Clinic Level of Care Assessment Details Patient Name: [name], [name] Date of Service: 09/07/2017 10:30 AM Medical Record Number: [id number] Patient Account Number: [id number] Date of Birth/Sex: 12-08-1920 (84 y.o. Female) Treating RN: Curtis Sites Primary Care Jade Burright: TATE, Katherina Right Other Clinician: Referring Inaaya Vellucci: Dewaine Oats Treating Jarman Litton/Extender: Linwood Dibbles, HOYT Weeks in Treatment: 22 Clinic Level of Care Assessment Items TOOL 4 Quantity Score []  - Use when only an EandM is performed on FOLLOW-UP visit 0 ASSESSMENTS - Nursing Assessment / Reassessment X -  Reassessment of Co-morbidities (includes updates in patient status) 1 10 X- 1 5 Reassessment of Adherence to Treatment Plan ASSESSMENTS - Wound and Skin Assessment / Reassessment []  - Simple Wound Assessment / Reassessment - one wound 0 X- 4 5 Complex Wound Assessment / Reassessment - multiple wounds []  - 0 Dermatologic / Skin Assessment (not related to wound area) ASSESSMENTS - Focused Assessment X - Circumferential Edema Measurements - multi extremities 1 5 []  - 0 Nutritional Assessment / Counseling / Intervention X- 1 5 Lower Extremity Assessment (monofilament, tuning fork, pulses) X- 1 10 Peripheral Arterial Disease Assessment (using hand held doppler) ASSESSMENTS - Ostomy and/or Continence Assessment and Care []  - Incontinence Assessment and Management 0 []  - 0 Ostomy Care Assessment and Management (repouching, etc.) PROCESS - Coordination of Care X - Simple Patient / Family Education for ongoing care 1 15 []  - 0 Complex (extensive) Patient / Family Education for ongoing care []  - 0 Staff obtains Chiropractor, Records, Test Results / Process Orders []  - 0 Staff telephones HHA, Nursing Homes / Clarify orders / etc []  - 0 Routine Transfer to another Facility (non-emergent condition) []  - 0 Routine Hospital Admission (non-emergent condition) []  - 0 New Admissions / Manufacturing engineer / Ordering NPWT, Apligraf, etc. []  - 0 Emergency Hospital Admission (emergent condition) X- 1 10 Simple Discharge Coordination Ney, Alizae L. ([id number]) []  - 0 Complex (extensive) Discharge Coordination PROCESS - Special Needs []  - Pediatric / Minor Patient Management 0 []  - 0 Isolation Patient Management []  - 0 Hearing / Language / Visual special needs []  - 0 Assessment of Community assistance (transportation, D/C planning, etc.) []  - 0 Additional assistance / Altered mentation []  - 0 Support Surface(s) Assessment (bed, cushion, seat, etc.) INTERVENTIONS - Wound Cleansing /  Measurement []  - Simple Wound Cleansing - one wound 0 X- 4 5 Complex Wound Cleansing - multiple wounds X- 1 5 Wound Imaging (photographs - any number of wounds) []  - 0  Wound Tracing (instead of photographs) []  - 0 Simple Wound Measurement - one wound X- 4 5 Complex Wound Measurement - multiple wounds INTERVENTIONS - Wound Dressings X - Small Wound Dressing one or multiple wounds 4 10 []  - 0 Medium Wound Dressing one or multiple wounds []  - 0 Large Wound Dressing one or multiple wounds []  - 0 Application of Medications - topical []  - 0 Application of Medications - injection INTERVENTIONS - Miscellaneous []  - External ear exam 0 []  - 0 Specimen Collection (cultures, biopsies, blood, body fluids, etc.) []  - 0 Specimen(s) / Culture(s) sent or taken to Lab for analysis []  - 0 Patient Transfer (multiple staff / Nurse, adult / Similar devices) []  - 0 Simple Staple / Suture removal (25 or less) []  - 0 Complex Staple / Suture removal (26 or more) []  - 0 Hypo / Hyperglycemic Management (close monitor of Blood Glucose) []  - 0 Ankle / Brachial Index (ABI) - do not check if billed separately X- 1 5 Vital Signs Caldeira, Abeera L. ([id number]) Has the patient been seen at the hospital within the last three years: Yes Total Score: 170 Level Of Care: New/Established - Level 5 Electronic Signature(s) Signed: 09/07/2017 5:21:02 PM By: Curtis Sites Entered By: Curtis Sites on 09/07/2017 11:36:16 Lanni, Koby Elbert Ewings ([id number]) -------------------------------------------------------------------------------- Encounter Discharge Information Details Patient Name: [name] Bayley L. Date of Service: 09/07/2017 10:30 AM Medical Record Number: [id number] Patient Account Number: [id number] Date of Birth/Sex: 1920/07/19 (84 y.o. Female) Treating RN: Ashok Cordia, Debi Primary Care Kile Kabler: TATE, [facility] Other Clinician: Referring Anahy Esh: Dewaine Oats Treating Lavana Huckeba/Extender: Linwood Dibbles, HOYT Weeks in  Treatment: 22 Encounter Discharge Information Items Discharge Pain Level: 0 Discharge Condition: Stable Ambulatory Status: Wheelchair Other (Note Discharge Destination: Required) Transportation: Private Auto Accompanied By: caregiver Schedule Follow-up Appointment: Yes Medication Reconciliation completed and No provided to Patient/Care Mishon Blubaugh: Provided on Clinical Summary of Care: 09/07/2017 Form Type Recipient Paper Patient [facility] Electronic Signature(s) Signed: 09/11/2017 8:07:32 AM By: Gwenlyn Perking Entered By: Gwenlyn Perking on 09/07/2017 11:40:36 Stumpo, Ahna L. ([id number]) -------------------------------------------------------------------------------- Lower Extremity Assessment Details Patient Name: Hendryx, Paulyne L. Date of Service: 09/07/2017 10:30 AM Medical Record Number: [id number] Patient Account Number: [id number] Date of Birth/Sex: [birth year] (84 y.o. Female) Treating RN: Renne Crigler Primary Care Conswella Bruney: TATE, Katherina Right Other Clinician: Referring Hutch Rhett: Dewaine Oats Treating Kimeka Badour/Extender: STONE III, HOYT Weeks in Treatment: 22 Edema Assessment Assessed: [Left: No] [Right: No] Edema: [Left: Ye] [Right: s] Calf Left: Right: Point of Measurement: 33 cm From Medial Instep cm 33 cm Ankle Left: Right: Point of Measurement: 8 cm From Medial Instep cm 21.3 cm Vascular Assessment Claudication: Claudication Assessment [Right:None] Pulses: Dorsalis Pedis Palpable: [Right:Yes] Posterior Tibial Extremity colors, hair growth, and conditions: Extremity Color: [Right:Normal] Hair Growth on Extremity: [Right:No] Temperature of Extremity: [Right:Cool] Capillary Refill: [Right:> 3 seconds] Toe Nail Assessment Left: Right: Thick: Yes Discolored: Yes Deformed: Yes Improper Length and Hygiene: Yes Electronic Signature(s) Signed: 09/07/2017 4:58:29 PM By: Renne Crigler Entered By: Renne Crigler on 09/07/2017 11:14:12 Geissinger, Amaura L.  ([id number]) -------------------------------------------------------------------------------- Multi Wound Chart Details Patient Name: Myren, Abi L. Date of Service: 09/07/2017 10:30 AM Medical Record Number: [id number] Patient Account Number: [id number] Date of Birth/Sex: 12/24/1920 (84 y.o. Female) Treating RN: Curtis Sites Primary Care Glendale Wherry: TATE, Katherina Right Other Clinician: Referring Pola Furno: Dewaine Oats Treating Analayah Brooke/Extender: STONE III, HOYT Weeks in Treatment: 22 Vital Signs Height(in): 59 Pulse(bpm): 42 Weight(lbs): 101 Blood Pressure(mmHg): 109/83 Body Mass Index(BMI): 20 Temperature(F): 97.7 Respiratory Rate 16 (breaths/min): Photos: [1:No Photos] [2:No Photos] [3:No Photos]  Wound Location: [1:Right Toe Second] [2:Right Toe Great] [3:Right Toe Third] Wounding Event: [1:Gradually Appeared] [2:Gradually Appeared] [3:Gradually Appeared] Primary Etiology: [1:Arterial Insufficiency Ulcer] [2: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] Comorbid History: [1:Hypertension, Gout, Osteoarthritis] [2:Hypertension, Gout, Osteoarthritis] [3:Hypertension, Gout, Osteoarthritis] Date Acquired: [1:12/26/2016] [2:07/18/2017] [3:07/18/2017] Weeks of Treatment: [1:22] [2:6] [3:6] Wound Status: [1:Open] [2:Open] [3:Open] Pending Amputation on [1:Yes] [2:No] [3:No] Presentation: Measurements L x W x D [1:0.7x0.4x0.2] [2:0.7x0.5x0.1] [3:0.4x0.4x0.1] (cm) Area (cm) : [1:0.22] [2:0.275] [3:0.126] Volume (cm) : [1:0.044] [2:0.027] [3:0.013] % Reduction in Area: [1:-134.00%] [2:37.50%] [3:0.00%] % Reduction in Volume: [1:-388.90%] [2:38.60%] [3:0.00%] Classification: [1:Full Thickness With Exposed Support Structures] [2:Full Thickness Without Exposed Support Structures] [3:Full Thickness Without Exposed Support Structures] Exudate Amount: [1:Medium] [2:Medium] [3:Medium] Exudate Type: [1:Serosanguineous] [2:Serosanguineous] [3:Serosanguineous] Exudate Color: [1:red,  brown] [2:red, brown] [3:red, brown] Wound Margin: [1:N/A] [2:N/A] [3:Flat and Intact] Granulation Amount: [1:Small (1-33%)] [2:Medium (34-66%)] [3:Large (67-100%)] Granulation Quality: [1:Red] [2:Red, Pink] [3:Red, Pink] Necrotic Amount: [1:Large (67-100%)] [2:Medium (34-66%)] [3:Small (1-33%)] Exposed Structures: [1:Fat Layer (Subcutaneous Tissue) Exposed: Yes] [2:Fat Layer (Subcutaneous Tissue) Exposed: Yes] [3:Fat Layer (Subcutaneous Tissue) Exposed: Yes] Epithelialization: [1:Small (1-33%)] [2:Small (1-33%)] [3:Small (1-33%)] Periwound Skin Texture: [1:Excoriation: No Induration: No Callus: No Crepitus: No Rash: No Scarring: No] [2:Callus: Yes Excoriation: No Induration: No Crepitus: No Rash: No Scarring: No] [3:Callus: Yes Excoriation: No Induration: No Crepitus: No Rash: No Scarring: No] Periwound Skin Moisture: [1:Dry/Scaly: Yes Maceration: No] [2:Maceration: No Dry/Scaly: No] [3:Maceration: No Dry/Scaly: No] Periwound Skin Color: Atrophie Blanche: No Atrophie Blanche: No Atrophie Blanche: No Cyanosis: No Cyanosis: No Cyanosis: No Ecchymosis: No Ecchymosis: No Ecchymosis: No Erythema: No Erythema: No Erythema: No Hemosiderin Staining: No Hemosiderin Staining: No Hemosiderin Staining: No Mottled: No Mottled: No Mottled: No Pallor: No Pallor: No Pallor: No Rubor: No Rubor: No Rubor: No Temperature: No Abnormality N/A N/A Tenderness on Palpation: Yes No No Wound Preparation: Ulcer Cleansing: Ulcer Cleansing: Ulcer Cleansing: Rinsed/Irrigated with Saline Rinsed/Irrigated with Saline Rinsed/Irrigated with Saline Topical Anesthetic Applied: Topical Anesthetic Applied: Topical Anesthetic Applied: Other: lidocaine 4% Other: lidocaine 4% Other: lidocaine 4% Wound Number: 4 N/A N/A Photos: No Photos N/A N/A Wound Location: Right Toe Fourth N/A N/A Wounding Event: Gradually Appeared N/A N/A Primary Etiology: Arterial Insufficiency Ulcer N/A N/A Comorbid History:  Hypertension, Gout, N/A N/A Osteoarthritis Date Acquired: 09/07/2017 N/A N/A Weeks of Treatment: 0 N/A N/A Wound Status: Open N/A N/A Pending Amputation on No N/A N/A Presentation: Measurements L x W x D 0.6x0.5x0.1 N/A N/A (cm) Area (cm) : 0.236 N/A N/A Volume (cm) : 0.024 N/A N/A % Reduction in Area: 0.00% N/A N/A % Reduction in Volume: 0.00% N/A N/A Classification: Full Thickness Without N/A N/A Exposed Support Structures Exudate Amount: Medium N/A N/A Exudate Type: Serosanguineous N/A N/A Exudate Color: red, brown N/A N/A Wound Margin: Flat and Intact N/A N/A Granulation Amount: Medium (34-66%) N/A N/A Granulation Quality: Red, Pink N/A N/A Necrotic Amount: Medium (34-66%) N/A N/A Exposed Structures: Fat Layer (Subcutaneous N/A N/A Tissue) Exposed: Yes Fascia: No Tendon: No Muscle: No Joint: No Bone: No Epithelialization: Small (1-33%) N/A N/A Periwound Skin Texture: Excoriation: No N/A N/A Induration: No Callus: No Crepitus: No Rash: No Scarring: No Periwound Skin Moisture: N/A N/A Murley, Seville L. ([id number]) Maceration: No Dry/Scaly: No Periwound Skin Color: Atrophie Blanche: No N/A N/A Cyanosis: No Ecchymosis: No Erythema: No Hemosiderin Staining: No Mottled: No Pallor: No Rubor: No Temperature: No Abnormality N/A N/A Tenderness on Palpation: No N/A N/A Wound Preparation: Ulcer Cleansing: N/A N/A Rinsed/Irrigated with Saline Topical Anesthetic Applied:  Other: lidocaine 4% Treatment Notes Electronic Signature(s) Signed: 09/07/2017 5:21:02 PM By: Curtis Sites Entered By: Curtis Sites on 09/07/2017 11:23:09 Haynie, [name] ([id number]) -------------------------------------------------------------------------------- Multi-Disciplinary Care Plan Details Patient Name: Lazcano, Shenia L. Date of Service: 09/07/2017 10:30 AM Medical Record Number: [id number] Patient Account Number: [id number] Date of Birth/Sex: Jul 04, 1921 (84 y.o. Female) Treating RN:  Curtis Sites Primary Care Allissa Albright: Dewaine Oats Other Clinician: Referring Jasman Pfeifle: Dewaine Oats Treating Matilyn Fehrman/Extender: Linwood Dibbles, HOYT Weeks in Treatment: 22 Active Inactive ` Abuse / Safety / Falls / Self Care Management Nursing Diagnoses: Impaired physical mobility Goals: Patient will remain injury free related to falls Date Initiated: 04/06/2017 Target Resolution Date: 06/22/2017 Goal Status: Active Interventions: Assess fall risk on admission and as needed Notes: ` Orientation to the Wound Care Program Nursing Diagnoses: Knowledge deficit related to the wound healing center program Goals: Patient/caregiver will verbalize understanding of the Wound Healing Center Program Date Initiated: 04/06/2017 Target Resolution Date: 06/22/2017 Goal Status: Active Interventions: Provide education on orientation to the wound center Notes: ` Wound/Skin Impairment Nursing Diagnoses: Impaired tissue integrity Goals: Ulcer/skin breakdown will heal within 14 weeks Date Initiated: 04/06/2017 Target Resolution Date: 06/22/2017 Goal Status: Active Interventions: RACHELLE, EDWARDS ([id number]) Assess patient/caregiver ability to obtain necessary supplies Assess patient/caregiver ability to perform ulcer/skin care regimen upon admission and as needed Assess ulceration(s) every visit Notes: Electronic Signature(s) Signed: 09/07/2017 5:21:02 PM By: Curtis Sites Entered By: Curtis Sites on 09/07/2017 11:22:59 Raczkowski, Elinor L. ([id number]) -------------------------------------------------------------------------------- Pain Assessment Details Patient Name: Verbeek, Javaya L. Date of Service: 09/07/2017 10:30 AM Medical Record Number: [id number] Patient Account Number: [id number] Date of Birth/Sex: [birth year] (84 y.o. Female) Treating RN: Renne Crigler Primary Care Aneliz Carbary: Dewaine Oats Other Clinician: Referring Breyson Kelm: Dewaine Oats Treating Harlee Pursifull/Extender: STONE III, HOYT Weeks in  Treatment: 22 Active Problems Location of Pain Severity and Description of Pain Patient Has Paino No Site Locations Pain Management and Medication Current Pain Management: Electronic Signature(s) Signed: 09/07/2017 4:58:29 PM By: Renne Crigler Entered By: Renne Crigler on 09/07/2017 10:56:09 Fehrman, Celes Elbert Ewings ([id number]) -------------------------------------------------------------------------------- Patient/Caregiver Education Details Patient Name: [name]. Date of Service: 09/07/2017 10:30 AM Medical Record Number: [id number] Patient Account Number: [id number] Date of Birth/Gender: Jan 22, 1921 (84 y.o. Female) Treating RN: Phillis Haggis Primary Care Physician: TATE, [facility] Other Clinician: Referring Physician: Dewaine Oats Treating Physician/Extender: Skeet Simmer in Treatment: 22 Education Assessment Education Provided To: Patient and Caregiver Education Topics Provided Wound/Skin Impairment: Handouts: Caring for Your Ulcer, Other: change dressing as ordered Methods: Demonstration, Explain/Verbal Responses: State content correctly Electronic Signature(s) Signed: 09/12/2017 4:30:35 PM By: Alejandro Mulling Entered By: Alejandro Mulling on 09/07/2017 11:39:23 Acres, Riko L. ([id number]) -------------------------------------------------------------------------------- Wound Assessment Details Patient Name: Leitz, Kissa L. Date of Service: 09/07/2017 10:30 AM Medical Record Number: [id number] Patient Account Number: [id number] Date of Birth/Sex: 05-20-1921 (84 y.o. Female) Treating RN: Renne Crigler Primary Care Laketia Vicknair: TATE, Katherina Right Other Clinician: Referring Yahaira Bruski: Dewaine Oats Treating Sabryna Lahm/Extender: STONE III, HOYT Weeks in Treatment: 22 Wound Status Wound Number: 1 Primary Etiology: Arterial Insufficiency Ulcer Wound Location: Right Toe Second Wound Status: Open Wounding Event: Gradually Appeared Comorbid History: Hypertension, Gout,  Osteoarthritis Date Acquired: 12/26/2016 Weeks Of Treatment: 22 Clustered Wound: No Pending Amputation On Presentation Photos Photo Uploaded By: Renne Crigler on 09/07/2017 16:36:53 Wound Measurements Length: (cm) 0.7 Width: (cm) 0.4 Depth: (cm) 0.2 Area: (cm) 0.22 Volume: (cm) 0.044 % Reduction in Area: -134% % Reduction in Volume: -388.9% Epithelialization: Small (1-33%) Tunneling: No Undermining: No Wound Description Full Thickness With Exposed Support  Classification: Structures Exudate Medium Amount: Exudate Type: Serosanguineous Exudate Color: red, brown Foul Odor After Cleansing: No Slough/Fibrino Yes Wound Bed Granulation Amount: Small (1-33%) Exposed Structure Granulation Quality: Red Fat Layer (Subcutaneous Tissue) Exposed: Yes Necrotic Amount: Large (67-100%) Necrotic Quality: Adherent Slough Periwound Skin Texture Texture Color No Abnormalities Noted: No No Abnormalities Noted: No Worthy, Houston L. ([id number]) Callus: No Atrophie Blanche: No Crepitus: No Cyanosis: No Excoriation: No Ecchymosis: No Induration: No Erythema: No Rash: No Hemosiderin Staining: No Scarring: No Mottled: No Pallor: No Moisture Rubor: No No Abnormalities Noted: No Dry / Scaly: Yes Temperature / Pain Maceration: No Temperature: No Abnormality Tenderness on Palpation: Yes Wound Preparation Ulcer Cleansing: Rinsed/Irrigated with Saline Topical Anesthetic Applied: Other: lidocaine 4%, Treatment Notes Wound #1 (Right Toe Second) 1. Cleansed with: Clean wound with Normal Saline 2. Anesthetic Topical Lidocaine 4% cream to wound bed prior to debridement 4. Dressing Applied: Other dressing (specify in notes) Notes silvercel rope Electronic Signature(s) Signed: 09/07/2017 4:58:29 PM By: Renne Crigler Entered By: Renne Crigler on 09/07/2017 11:09:30 Daddona, Doneen L.  ([id number]) -------------------------------------------------------------------------------- Wound Assessment Details Patient Name: Vary, Fay L. Date of Service: 09/07/2017 10:30 AM Medical Record Number: [id number] Patient Account Number: [id number] Date of Birth/Sex: 06/15/1921 (84 y.o. Female) Treating RN: Renne Crigler Primary Care Oona Trammel: TATE, Katherina Right Other Clinician: Referring Tarina Volk: Dewaine Oats Treating Makiah Clauson/Extender: STONE III, HOYT Weeks in Treatment: 22 Wound Status Wound Number: 2 Primary Etiology: Arterial Insufficiency Ulcer Wound Location: Right Toe Great Wound Status: Open Wounding Event: Gradually Appeared Comorbid History: Hypertension, Gout, Osteoarthritis Date Acquired: 07/18/2017 Weeks Of Treatment: 6 Clustered Wound: No Photos Photo Uploaded By: Renne Crigler on 09/07/2017 16:37:19 Wound Measurements Length: (cm) 0.7 Width: (cm) 0.5 Depth: (cm) 0.1 Area: (cm) 0.275 Volume: (cm) 0.027 % Reduction in Area: 37.5% % Reduction in Volume: 38.6% Epithelialization: Small (1-33%) Tunneling: No Undermining: No Wound Description Full Thickness Without Exposed Support Foul Classification: Structures Slou Exudate Medium Amount: Exudate Type: Serosanguineous Exudate Color: red, brown Odor After Cleansing: No gh/Fibrino Yes Wound Bed Granulation Amount: Medium (34-66%) Exposed Structure Granulation Quality: Red, Pink Fat Layer (Subcutaneous Tissue) Exposed: Yes Necrotic Amount: Medium (34-66%) Necrotic Quality: Adherent Slough Periwound Skin Texture Texture Color No Abnormalities Noted: No No Abnormalities Noted: No Callus: Yes Atrophie Blanche: No Collister, Cherylyn L. ([id number]) Crepitus: No Cyanosis: No Excoriation: No Ecchymosis: No Induration: No Erythema: No Rash: No Hemosiderin Staining: No Scarring: No Mottled: No Pallor: No Moisture Rubor: No No Abnormalities Noted: No Dry / Scaly: No Maceration: No Wound  Preparation Ulcer Cleansing: Rinsed/Irrigated with Saline Topical Anesthetic Applied: Other: lidocaine 4%, Treatment Notes Wound #2 (Right Toe Great) 1. Cleansed with: Clean wound with Normal Saline 2. Anesthetic Topical Lidocaine 4% cream to wound bed prior to debridement 4. Dressing Applied: Other dressing (specify in notes) Notes silvercel rope Electronic Signature(s) Signed: 09/07/2017 4:58:29 PM By: Renne Crigler Entered By: Renne Crigler on 09/07/2017 11:11:37 Farfan, Jaxyn L. ([id number]) -------------------------------------------------------------------------------- Wound Assessment Details Patient Name: Vanvoorhis, Evanee L. Date of Service: 09/07/2017 10:30 AM Medical Record Number: [id number] Patient Account Number: [id number] Date of Birth/Sex: July 14, 1921 (84 y.o. Female) Treating RN: Renne Crigler Primary Care Adriel Kessen: Dewaine Oats Other Clinician: Referring Mary-Anne Polizzi: TATE, Katherina Right Treating Ronette Hank/Extender: STONE III, HOYT Weeks in Treatment: 22 Wound Status Wound Number: 3 Primary Etiology: Arterial Insufficiency Ulcer Wound Location: Right Toe Third Wound Status: Open Wounding Event: Gradually Appeared Comorbid History: Hypertension, Gout, Osteoarthritis Date Acquired: 07/18/2017 Weeks Of Treatment: 6 Clustered Wound: No Photos Photo Uploaded By: Renne Crigler on 09/07/2017  16:37:36 Wound Measurements Length: (cm) 0.4 Width: (cm) 0.4 Depth: (cm) 0.1 Area: (cm) 0.126 Volume: (cm) 0.013 % Reduction in Area: 0% % Reduction in Volume: 0% Epithelialization: Small (1-33%) Tunneling: No Undermining: No Wound Description Full Thickness Without Exposed Support Classification: Structures Wound Margin: Flat and Intact Exudate Medium Amount: Exudate Type: Serosanguineous Exudate Color: red, brown Foul Odor After Cleansing: No Slough/Fibrino Yes Wound Bed Granulation Amount: Large (67-100%) Exposed Structure Granulation Quality: Red, Pink Fat  Layer (Subcutaneous Tissue) Exposed: Yes Necrotic Amount: Small (1-33%) Necrotic Quality: Adherent Slough Periwound Skin Texture Texture Color No Abnormalities Noted: No No Abnormalities Noted: No Pendergrass, Jermesha L. ([id number]) Callus: Yes Atrophie Blanche: No Crepitus: No Cyanosis: No Excoriation: No Ecchymosis: No Induration: No Erythema: No Rash: No Hemosiderin Staining: No Scarring: No Mottled: No Pallor: No Moisture Rubor: No No Abnormalities Noted: No Dry / Scaly: No Maceration: No Wound Preparation Ulcer Cleansing: Rinsed/Irrigated with Saline Topical Anesthetic Applied: Other: lidocaine 4%, Treatment Notes Wound #3 (Right Toe Third) 1. Cleansed with: Clean wound with Normal Saline 2. Anesthetic Topical Lidocaine 4% cream to wound bed prior to debridement 4. Dressing Applied: Other dressing (specify in notes) Notes silvercel rope Electronic Signature(s) Signed: 09/07/2017 4:58:29 PM By: Renne Crigler Entered By: Renne Crigler on 09/07/2017 11:12:51 Goshorn, Mathilde L. ([id number]) -------------------------------------------------------------------------------- Wound Assessment Details Patient Name: Agustin, Clytee L. Date of Service: 09/07/2017 10:30 AM Medical Record Number: [id number] Patient Account Number: [id number] Date of Birth/Sex: 01/15/1921 (84 y.o. Female) Treating RN: Renne Crigler Primary Care Shawne Bulow: TATE, Katherina Right Other Clinician: Referring Darya Bigler: Dewaine Oats Treating Briseidy Spark/Extender: STONE III, HOYT Weeks in Treatment: 22 Wound Status Wound Number: 4 Primary Etiology: Arterial Insufficiency Ulcer Wound Location: Right Toe Fourth Wound Status: Open Wounding Event: Gradually Appeared Comorbid History: Hypertension, Gout, Osteoarthritis Date Acquired: 09/07/2017 Weeks Of Treatment: 0 Clustered Wound: No Photos Photo Uploaded By: Renne Crigler on 09/07/2017 16:38:07 Wound Measurements Length: (cm) 0.6 Width: (cm) 0.5 Depth: (cm)  0.1 Area: (cm) 0.236 Volume: (cm) 0.024 % Reduction in Area: 0% % Reduction in Volume: 0% Epithelialization: Small (1-33%) Tunneling: No Undermining: No Wound Description Full Thickness Without Exposed Support Classification: Structures Wound Margin: Flat and Intact Exudate Medium Amount: Exudate Type: Serosanguineous Exudate Color: red, brown Foul Odor After Cleansing: No Slough/Fibrino Yes Wound Bed Granulation Amount: Medium (34-66%) Exposed Structure Granulation Quality: Red, Pink Fascia Exposed: No Necrotic Amount: Medium (34-66%) Fat Layer (Subcutaneous Tissue) Exposed: Yes Tendon Exposed: No Muscle Exposed: No Joint Exposed: No Bone Exposed: No Gonyer, Dannell L. ([id number]) Periwound Skin Texture Texture Color No Abnormalities Noted: No No Abnormalities Noted: No Callus: No Atrophie Blanche: No Crepitus: No Cyanosis: No Excoriation: No Ecchymosis: No Induration: No Erythema: No Rash: No Hemosiderin Staining: No Scarring: No Mottled: No Pallor: No Moisture Rubor: No No Abnormalities Noted: No Dry / Scaly: No Temperature / Pain Maceration: No Temperature: No Abnormality Wound Preparation Ulcer Cleansing: Rinsed/Irrigated with Saline Topical Anesthetic Applied: Other: lidocaine 4%, Treatment Notes Wound #4 (Right Toe Fourth) 1. Cleansed with: Clean wound with Normal Saline 2. Anesthetic Topical Lidocaine 4% cream to wound bed prior to debridement 4. Dressing Applied: Other dressing (specify in notes) Notes silvercel rope Electronic Signature(s) Signed: 09/07/2017 4:58:29 PM By: Renne Crigler Entered By: Renne Crigler on 09/07/2017 11:14:00 Baldyga, Halea L. ([id number]) -------------------------------------------------------------------------------- Vitals Details Patient Name: Hollis, Yoseline L. Date of Service: 09/07/2017 10:30 AM Medical Record Number: [id number] Patient Account Number: [id number] Date of Birth/Sex: 11-29-1920 (84 y.o.  Female) Treating RN: Renne Crigler Primary Care Luigi Stuckey: TATE, [facility] Other  Clinician: Referring Maja Mccaffery: TATE, Katherina RightENNY Treating Daveena Elmore/Extender: STONE III, HOYT Weeks in Treatment: 22 Vital Signs Time Taken: 10:56 Temperature (F): 97.7 Height (in): 59 Pulse (bpm): 42 Source: Stated Respiratory Rate (breaths/min): 16 Weight (lbs): 101 Blood Pressure (mmHg): 109/83 Source: Stated Reference Range: 80 - 120 mg / dl Body Mass Index (BMI): 20.4 Electronic Signature(s) Signed: 09/07/2017 4:58:29 PM By: Renne CriglerFlinchum, Cheryl Entered By: Renne CriglerFlinchum, Cheryl on 09/07/2017 10:57:08

## 2017-09-17 ENCOUNTER — Encounter (INDEPENDENT_AMBULATORY_CARE_PROVIDER_SITE_OTHER): Payer: Self-pay

## 2017-09-17 ENCOUNTER — Ambulatory Visit (INDEPENDENT_AMBULATORY_CARE_PROVIDER_SITE_OTHER): Payer: Medicare Other

## 2017-09-17 ENCOUNTER — Encounter (INDEPENDENT_AMBULATORY_CARE_PROVIDER_SITE_OTHER): Payer: Self-pay | Admitting: Vascular Surgery

## 2017-09-17 ENCOUNTER — Other Ambulatory Visit (INDEPENDENT_AMBULATORY_CARE_PROVIDER_SITE_OTHER): Payer: Self-pay | Admitting: Vascular Surgery

## 2017-09-17 ENCOUNTER — Ambulatory Visit (INDEPENDENT_AMBULATORY_CARE_PROVIDER_SITE_OTHER): Payer: Medicare Other | Admitting: Vascular Surgery

## 2017-09-17 VITALS — BP 172/60 | HR 59 | Resp 16 | Wt 103.0 lb

## 2017-09-17 DIAGNOSIS — M869 Osteomyelitis, unspecified: Secondary | ICD-10-CM

## 2017-09-17 DIAGNOSIS — I70239 Atherosclerosis of native arteries of right leg with ulceration of unspecified site: Secondary | ICD-10-CM

## 2017-09-17 DIAGNOSIS — L97513 Non-pressure chronic ulcer of other part of right foot with necrosis of muscle: Secondary | ICD-10-CM | POA: Diagnosis not present

## 2017-09-17 DIAGNOSIS — I739 Peripheral vascular disease, unspecified: Secondary | ICD-10-CM

## 2017-09-17 DIAGNOSIS — Z9862 Peripheral vascular angioplasty status: Secondary | ICD-10-CM

## 2017-09-17 NOTE — Progress Notes (Signed)
Subjective:    Patient ID: [name], female    DOB: 30-Oct-1920, 84 y.o.   MRN: [id number] Chief Complaint  Patient presents with  . Follow-up    2week abi   Patient presents for her first post procedure follow-up.  The patient is status post a right lower extremity angiogram with intervention for peripheral artery disease with toe ulceration on August 21, 2017.  The patient is seen with her group home aide.  On August 24, 2017, the wound center ordered an MRI of the patient's right foot to assess for osteomyelitis.  The MRI was notable for "soft tissue wound along the dorsal aspect of the second DIP joint with osteomyelitis of the head of the second middle phalanx". Minimal improvement to ulceration as per aide.  The patient underwent a bilateral ABI which was notable for Right: 0.59 and Left: 0.45.  Monophasic tibials laterally.  Possible occlusion of the right anterior tibial artery.  When compared to the previous ABI on August 08, 2017: left ABIs are stable and slight improvement to right ABIs.  The patient denies any fever, nausea vomiting.   Review of Systems  Constitutional: Negative.   HENT: Negative.   Eyes: Negative.   Respiratory: Negative.   Cardiovascular:       Peripheral artery disease Osteomyelitis Toe ulceration  Gastrointestinal: Negative.   Endocrine: Negative.   Genitourinary: Negative.   Musculoskeletal: Negative.   Skin: Negative.   Allergic/Immunologic: Negative.   Neurological: Negative.   Hematological: Negative.   Psychiatric/Behavioral: Negative.       Objective:   Physical Exam  Constitutional: She is oriented to person, place, and time. She appears well-developed and well-nourished. No distress.  HENT:  Head: Normocephalic and atraumatic.  Eyes: Conjunctivae are normal. Pupils are equal, round, and reactive to light.  Neck: Normal range of motion.  Cardiovascular: Normal rate, regular rhythm, normal heart sounds and intact distal pulses.    Pulses:      Radial pulses are 2+ on the right side, and 2+ on the left side.  Hard to palpate pedal pulses bilaterally  Pulmonary/Chest: Effort normal and breath sounds normal.  Musculoskeletal: Normal range of motion. She exhibits no edema.  Neurological: She is alert and oriented to person, place, and time.  Skin: She is not diaphoretic.  Full-thickness ulceration with exposed bone at the dorsal proximal interphalangeal joint of the right second toe.  Psychiatric: She has a normal mood and affect. Her behavior is normal. Judgment and thought content normal.  Vitals reviewed.  BP (!) 172/60 (BP Location: Right Arm)   Pulse (!) 59   Resp 16   Wt 103 lb (46.7 kg)   BMI 20.12 kg/m   Past Medical History:  Diagnosis Date  . GERD (gastroesophageal reflux disease)   . Gout   . Hypertension   . Osteoarthritis   . Renal disorder    Social History   Socioeconomic History  . Marital status: Single    Spouse name: Not on file  . Number of children: Not on file  . Years of education: Not on file  . Highest education level: Not on file  Social Needs  . Financial resource strain: Not on file  . Food insecurity - worry: Not on file  . Food insecurity - inability: Not on file  . Transportation needs - medical: Not on file  . Transportation needs - non-medical: Not on file  Occupational History  . Not on file  Tobacco Use  .  Smoking status: Never Smoker  . Smokeless tobacco: Never Used  Substance and Sexual Activity  . Alcohol use: No  . Drug use: No  . Sexual activity: Not Currently  Other Topics Concern  . Not on file  Social History Narrative  . Not on file   Past Surgical History:  Procedure Laterality Date  . LOWER EXTREMITY ANGIOGRAPHY Right 08/21/2017   Procedure: LOWER EXTREMITY ANGIOGRAPHY;  Surgeon: Renford Dills, MD;  Location: [facility] INVASIVE CV LAB;  Service: Cardiovascular;  Laterality: Right;  . NO PAST SURGERIES     Family History  Problem Relation Age  of Onset  . Hypertension Other   . Hyperlipidemia Other    No Known Allergies     Assessment & Plan:  Patient presents for her first post procedure follow-up.  The patient is status post a right lower extremity angiogram with intervention for peripheral artery disease with toe ulceration on August 21, 2017.  The patient is seen with her group home aide.  On August 24, 2017, the wound center ordered an MRI of the patient's right foot to assess for osteomyelitis.  The MRI was notable for "soft tissue wound along the dorsal aspect of the second DIP joint with osteomyelitis of the head of the second middle phalanx". Minimal improvement to ulceration as per aide.  The patient underwent a bilateral ABI which was notable for Right: 0.59 and Left: 0.45.  Monophasic tibials laterally.  Possible occlusion of the right anterior tibial artery.  When compared to the previous ABI on August 08, 2017: left ABIs are stable and slight improvement to right ABIs.  The patient denies any fever, nausea vomiting.  1. PAD (peripheral artery disease) (HCC) - Stable Patient with slight improvement to right ABI  No improvement to patient's ulceration Patient with recent diagnosis of osteomyelitis Patient may need amputation of toe in the future Recommend repeating the right lower extremity angiogram The initial angiogram was terminated after recannulization of the entire SFA and popliteal due to the contrast load and the patient's age Due to the patient's recent diagnosis of osteomyelitis and poor wound healing it would be beneficial to continue recannulization to the distal aspect of the patient's extremity to improve blood flow to the area Procedure, risks and benefits explained to the patient and her aide All questions answered The patient and her aide wish to proceed  2. 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 Osteomyelitis of right foot, unspecified type [facility]) -  New As above The patient has an appointment with Dr. Sampson Goon this Friday  Current Outpatient Medications on File Prior to Visit  Medication Sig Dispense Refill  . acetaminophen (TYLENOL) 500 MG tablet Take 1,000 mg by mouth every 6 (six) hours as needed for moderate pain.    Marland Kitchen allopurinol (ZYLOPRIM) 100 MG tablet Take 100 mg by mouth daily. (0800)    . amoxicillin-clavulanate (AUGMENTIN) 500-125 MG tablet Take 1 tablet by mouth 2 (two) times daily. (0800 & 2000)    . aspirin 81 MG chewable tablet Chew 81 mg by mouth daily. (0800)    . cloNIDine (CATAPRES) 0.2 MG tablet Take 0.2 mg by mouth 3 (three) times daily. (0800, 1700, & 2000)    . donepezil (ARICEPT) 5 MG tablet Take 5 mg by mouth every evening. (2000)    . felodipine (PLENDIL) 10 MG 24 hr tablet Take 10 mg by mouth daily. (0800)    . metoprolol (LOPRESSOR)  100 MG tablet Take 100 mg by mouth 2 (two) times daily. (0800 & 2000)    . Multiple Vitamin (MULTIVITAMIN WITH MINERALS) TABS tablet Take 1 tablet by mouth daily. (0800)    . QUEtiapine (SEROQUEL) 25 MG tablet Take 25 mg by mouth 2 (two) times daily. (0800 & 2000)    . amLODipine (NORVASC) 2.5 MG tablet Take 1 tablet (2.5 mg total) by mouth daily. 30 tablet 0   No current facility-administered medications on file prior to visit.    There are no Patient Instructions on file for this visit. No Follow-up on file.  Layman Gully A Jaelynne Hockley, PA-C

## 2017-09-20 ENCOUNTER — Other Ambulatory Visit (INDEPENDENT_AMBULATORY_CARE_PROVIDER_SITE_OTHER): Payer: Self-pay | Admitting: Vascular Surgery

## 2017-09-21 ENCOUNTER — Encounter: Payer: Medicare Other | Attending: Physician Assistant | Admitting: Physician Assistant

## 2017-09-21 DIAGNOSIS — I10 Essential (primary) hypertension: Secondary | ICD-10-CM | POA: Insufficient documentation

## 2017-09-21 DIAGNOSIS — M869 Osteomyelitis, unspecified: Secondary | ICD-10-CM | POA: Insufficient documentation

## 2017-09-21 DIAGNOSIS — M109 Gout, unspecified: Secondary | ICD-10-CM | POA: Insufficient documentation

## 2017-09-21 DIAGNOSIS — L97512 Non-pressure chronic ulcer of other part of right foot with fat layer exposed: Secondary | ICD-10-CM | POA: Insufficient documentation

## 2017-09-21 DIAGNOSIS — F0390 Unspecified dementia without behavioral disturbance: Secondary | ICD-10-CM | POA: Insufficient documentation

## 2017-09-21 DIAGNOSIS — I739 Peripheral vascular disease, unspecified: Secondary | ICD-10-CM | POA: Diagnosis not present

## 2017-09-21 DIAGNOSIS — M1990 Unspecified osteoarthritis, unspecified site: Secondary | ICD-10-CM | POA: Diagnosis not present

## 2017-09-24 ENCOUNTER — Inpatient Hospital Stay: Admission: RE | Admit: 2017-09-24 | Payer: Self-pay | Source: Ambulatory Visit

## 2017-09-25 ENCOUNTER — Ambulatory Visit
Admission: RE | Admit: 2017-09-25 | Discharge: 2017-09-25 | Disposition: A | Discharge status: Other Acute Inpatient Hospital | Payer: Medicare Other | Source: Ambulatory Visit | Attending: Vascular Surgery | Admitting: Vascular Surgery

## 2017-09-25 ENCOUNTER — Encounter
Admission: RE | Disposition: A | Discharge status: Nursing Home (Includes ICF) | Payer: Self-pay | Source: Ambulatory Visit | Attending: Vascular Surgery

## 2017-09-25 DIAGNOSIS — I739 Peripheral vascular disease, unspecified: Secondary | ICD-10-CM | POA: Diagnosis present

## 2017-09-25 DIAGNOSIS — M869 Osteomyelitis, unspecified: Secondary | ICD-10-CM | POA: Insufficient documentation

## 2017-09-25 DIAGNOSIS — I70261 Atherosclerosis of native arteries of extremities with gangrene, right leg: Secondary | ICD-10-CM | POA: Insufficient documentation

## 2017-09-25 DIAGNOSIS — L97919 Non-pressure chronic ulcer of unspecified part of right lower leg with unspecified severity: Secondary | ICD-10-CM | POA: Diagnosis not present

## 2017-09-25 DIAGNOSIS — L97909 Non-pressure chronic ulcer of unspecified part of unspecified lower leg with unspecified severity: Secondary | ICD-10-CM

## 2017-09-25 DIAGNOSIS — I70299 Other atherosclerosis of native arteries of extremities, unspecified extremity: Secondary | ICD-10-CM

## 2017-09-25 DIAGNOSIS — I70238 Atherosclerosis of native arteries of right leg with ulceration of other part of lower right leg: Secondary | ICD-10-CM | POA: Diagnosis not present

## 2017-09-25 HISTORY — PX: LOWER EXTREMITY ANGIOGRAPHY: CATH118251

## 2017-09-25 LAB — CREATININE, SERUM
CREATININE: 1.56 mg/dL — AB (ref 0.44–1.00)
GFR calc Af Amer: 32 mL/min — ABNORMAL LOW (ref >60)
GFR, EST NON AFRICAN AMERICAN: 27 mL/min — AB (ref >60)

## 2017-09-25 LAB — BUN: BUN: 39 mg/dL — AB (ref 6–20)

## 2017-09-25 SURGERY — LOWER EXTREMITY ANGIOGRAPHY
Anesthesia: Moderate Sedation | Laterality: Right

## 2017-09-25 MED ORDER — HYDRALAZINE HCL 20 MG/ML IJ SOLN
INTRAMUSCULAR | Status: DC | PRN
  Administered 2017-09-25: 10 mg via INTRAVENOUS

## 2017-09-25 MED ORDER — METHYLPREDNISOLONE SODIUM SUCC 125 MG IJ SOLR: 125.0000 mg | INTRAMUSCULAR | Status: DC | PRN

## 2017-09-25 MED ORDER — OXYCODONE HCL 5 MG PO TABS: 5.0000 mg | ORAL_TABLET | ORAL | Status: DC | PRN

## 2017-09-25 MED ORDER — FENTANYL CITRATE (PF) 100 MCG/2ML IJ SOLN
INTRAMUSCULAR | Status: AC
Start: 2017-09-25 — End: 2017-09-25
  Filled 2017-09-25: qty 2

## 2017-09-25 MED ORDER — CLOPIDOGREL BISULFATE 75 MG PO TABS
ORAL_TABLET | ORAL | Status: AC
  Filled 2017-09-25: qty 1

## 2017-09-25 MED ORDER — HYDROMORPHONE HCL 1 MG/ML IJ SOLN: 1.0000 mg | Freq: Once | INTRAMUSCULAR | Status: DC | PRN

## 2017-09-25 MED ORDER — HYDRALAZINE HCL 20 MG/ML IJ SOLN
INTRAMUSCULAR | Status: AC
  Filled 2017-09-25: qty 1

## 2017-09-25 MED ORDER — LIDOCAINE HCL (PF) 1 % IJ SOLN
INTRAMUSCULAR | Status: AC
  Filled 2017-09-25: qty 30

## 2017-09-25 MED ORDER — CLOPIDOGREL BISULFATE 75 MG PO TABS: 75.0000 mg | ORAL_TABLET | Freq: Once | ORAL | Status: DC

## 2017-09-25 MED ORDER — HEPARIN SODIUM (PORCINE) 1000 UNIT/ML IJ SOLN
INTRAMUSCULAR | Status: AC
  Filled 2017-09-25: qty 1

## 2017-09-25 MED ORDER — SODIUM CHLORIDE 0.9% FLUSH: 3.0000 mL | INTRAVENOUS | Status: DC | PRN

## 2017-09-25 MED ORDER — ONDANSETRON HCL 4 MG/2ML IJ SOLN: 4.0000 mg | Freq: Four times a day (QID) | INTRAMUSCULAR | Status: DC | PRN

## 2017-09-25 MED ORDER — SODIUM CHLORIDE 0.9 % IV SOLN: 250.0000 mL | INTRAVENOUS | Status: DC | PRN

## 2017-09-25 MED ORDER — HYDRALAZINE HCL 20 MG/ML IJ SOLN: 5.0000 mg | INTRAMUSCULAR | Status: DC | PRN

## 2017-09-25 MED ORDER — MIDAZOLAM HCL 5 MG/5ML IJ SOLN
INTRAMUSCULAR | Status: AC
  Filled 2017-09-25: qty 5

## 2017-09-25 MED ORDER — CLOPIDOGREL BISULFATE 75 MG PO TABS: 75.0000 mg | ORAL_TABLET | Freq: Every day | ORAL | 3 refills | Status: AC

## 2017-09-25 MED ORDER — SODIUM CHLORIDE 0.9 % IV SOLN
Freq: Once | INTRAVENOUS | Status: AC
  Administered 2017-09-25: 250 mL via INTRAVENOUS

## 2017-09-25 MED ORDER — FENTANYL CITRATE (PF) 100 MCG/2ML IJ SOLN
INTRAMUSCULAR | Status: DC | PRN
  Administered 2017-09-25 (×3): 25 ug via INTRAVENOUS

## 2017-09-25 MED ORDER — CEFAZOLIN SODIUM-DEXTROSE 2-4 GM/100ML-% IV SOLN
2.0000 g | Freq: Once | INTRAVENOUS | Status: AC
  Administered 2017-09-25: 2 g via INTRAVENOUS

## 2017-09-25 MED ORDER — FAMOTIDINE 20 MG PO TABS: 40.0000 mg | ORAL_TABLET | ORAL | Status: DC | PRN

## 2017-09-25 MED ORDER — SODIUM CHLORIDE 0.9% FLUSH: 3.0000 mL | Freq: Two times a day (BID) | INTRAVENOUS | Status: DC

## 2017-09-25 MED ORDER — HEPARIN SODIUM (PORCINE) 1000 UNIT/ML IJ SOLN
INTRAMUSCULAR | Status: DC | PRN
  Administered 2017-09-25: 4000 [IU] via INTRAVENOUS

## 2017-09-25 MED ORDER — MIDAZOLAM HCL 2 MG/2ML IJ SOLN
INTRAMUSCULAR | Status: DC | PRN
Start: 2017-09-25 — End: 2017-09-25
  Administered 2017-09-25 (×3): 0.5 mg via INTRAVENOUS

## 2017-09-25 MED ORDER — SODIUM CHLORIDE 0.9 % IV SOLN: INTRAVENOUS | Status: DC

## 2017-09-25 MED ORDER — SODIUM CHLORIDE 0.9 % IV BOLUS (SEPSIS)
250.0000 mL | Freq: Once | INTRAVENOUS | Status: AC
  Administered 2017-09-25: 250 mL via INTRAVENOUS

## 2017-09-25 MED ORDER — MORPHINE SULFATE (PF) 4 MG/ML IV SOLN: 2.0000 mg | INTRAVENOUS | Status: DC | PRN

## 2017-09-25 MED ORDER — SODIUM CHLORIDE 0.9 % IV SOLN
INTRAVENOUS | Status: DC
  Administered 2017-09-25: 1000 mL via INTRAVENOUS

## 2017-09-25 MED ORDER — LABETALOL HCL 5 MG/ML IV SOLN: 10.0000 mg | INTRAVENOUS | Status: DC | PRN

## 2017-09-25 MED ORDER — IOPAMIDOL (ISOVUE-300) INJECTION 61%
INTRAVENOUS | Status: DC | PRN
  Administered 2017-09-25: 75 mL via INTRA_ARTERIAL

## 2017-09-25 SURGICAL SUPPLY — 26 items
BALLN LUTONIX DCB 4X80X130 (BALLOONS) ×3
BALLN ULTRASCORE 4X40X130 (BALLOONS) ×3
BALLOON LUTONIX DCB 4X80X130 (BALLOONS) ×1 IMPLANT
BALLOON ULTRASCORE 4X40X130 (BALLOONS) ×1 IMPLANT
CATH BEACON 5 .035 65 RIM TIP (CATHETERS) ×3 IMPLANT
CATH CROSSER S6 154CM (CATHETERS) ×3 IMPLANT
CATH CXI SUPP ANG 2.6FR 150CM (MICROCATHETER) ×3 IMPLANT
CATH USHER ANGLED 130CM (CATHETERS) ×3 IMPLANT
CATH VERT 5FR 125CM (CATHETERS) ×3 IMPLANT
DEVICE PRESTO INFLATION (MISCELLANEOUS) ×3 IMPLANT
DEVICE STARCLOSE SE CLOSURE (Vascular Products) ×3 IMPLANT
DEVICE TORQUE (MISCELLANEOUS) ×3 IMPLANT
DRAPE TABLE BACK 80X90 (DRAPES) ×3 IMPLANT
GLIDEWIRE ADV .035X260CM (WIRE) ×3 IMPLANT
GUIDEWIRE PFTE-COATED .018X300 (WIRE) ×3 IMPLANT
IV NS 250ML (IV SOLUTION) ×2
IV NS 250ML BAXH (IV SOLUTION) ×1 IMPLANT
KIT FLOWMATE PROCEDURAL (MISCELLANEOUS) ×3 IMPLANT
NEEDLE ENTRY 21GA 7CM ECHOTIP (NEEDLE) ×3 IMPLANT
PACK ANGIOGRAPHY (CUSTOM PROCEDURE TRAY) ×3 IMPLANT
SET INTRO CAPELLA COAXIAL (SET/KITS/TRAYS/PACK) ×6 IMPLANT
SHEATH BRITE TIP 5FRX11 (SHEATH) ×3 IMPLANT
SHEATH FLEXOR ANSEL2 7FRX45 (SHEATH) ×3 IMPLANT
SHIELD X-DRAPE GOLD 12X17 (MISCELLANEOUS) ×3 IMPLANT
TOWEL OR 17X26 4PK STRL BLUE (TOWEL DISPOSABLE) ×3 IMPLANT
WIRE COMMAND ST 018 300CM (WIRE) ×3 IMPLANT

## 2017-09-25 NOTE — Discharge Instructions (Signed)

## 2017-09-25 NOTE — Op Note (Signed)
[facility] Percutaneous Study/Intervention Procedural Note   Date of Surgery: 09/25/2017  Surgeon:  Katha Cabal, MD.  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 Introduction catheter into right lower extremity 3rd order catheter placement with additional third order catheter placement 2. Contrast injection right lower extremity for distal runoff   3. Crosser atherectomy of the right peroneal and right posterior tibial arteries 4. Percutaneous transluminal angioplasty right peroneal and posterior tibial trunk arteries with a 4 mm Lutonix drug-eluting balloon             5.  Percutaneous transluminal angioplasty of the popliteal artery 4 mm with a Lutonix drug-eluting balloon                      6.   Star close closure left common femoral arteriotomy                Anesthesia: Conscious sedation was administered under my direct supervision by the interventional radiology RN. IV Versed plus fentanyl were utilized. Continuous ECG, pulse oximetry and blood pressure was monitored throughout the entire procedure. Conscious sedation was for a total of 97 minutes.  Sheath: 7 [place] Ansell left common femoral  Contrast: 75 cc  Fluoroscopy Time: 15.2 minutes  Indications: [name] presents with atherosclerotic occlusive disease bilateral lower extremities. The patient has developed ulceration and gangrene of the soft tissues of the right lower extremity. This places the patient at high risk for limb loss and amputation. The risks and benefits are reviewed all questions answered patient agrees to proceed.  Procedure: [name] is a 84 y.o. y.o. female who was identified and appropriate procedural time out was performed. The patient was  then placed supine on the table and prepped and draped in the usual sterile fashion.   Ultrasound was placed in the sterile sleeve and the left groin was evaluated the left common femoral artery was echolucent and pulsatile indicating patency.  Image was recorded for the permanent record and under real-time visualization a microneedle was inserted into the common femoral artery microwire followed by a micro-sheath.  A J-wire was then advanced through the micro-sheath and a  5 [place] sheath was then inserted over a J-wire. J-wire was then advanced and a rim catheter with the advantage wire was used to cross the aortic bifurcation the catheter wire were advanced down into the right distal external iliac artery. Oblique view of the femoral bifurcation was then obtained and subsequently the wire was reintroduced and the pigtail catheter negotiated into the SFA representing third order catheter placement. Distal runoff was then performed.  4 7 [place] Ansell 000 units of heparin was then given and allowed to circulate and a sheath was advanced up and over the bifurcation and positioned in the common femoral artery.  Wire and catheter were then used to negotiate the SFA stenosis and the catheter was then positioned in the distal popliteal where magnified imaging of the trifurcation was performed.  The S6 Crosser catheter was then prepped on the field and a angled Usher catheter was advanced into the cul-de-sac of the tibioperoneal trunk under magnified imaging. Using the S6 Crosser catheter the occlusion of the tibioperoneal trunk and posterior tibial was negotiated.  Injection of contrast confirmed intraluminal positioning.    Wire was then repositioned into the distal popliteal and the Usher catheter  repositioned to above the origin of the peroneal S6 catheter was then reintroduced and the occlusion at the origin of the peroneal was negotiated.  Subsequently a CXI catheter with an angled advantage 018 wire was  used to gain access to the distal peroneal.  Hand-injection contrast verified intraluminal positioning.  Over the 018 wire a 4 mm x 40 mm ultra score balloon was used to dilate the first few centimeters of the peroneal including the origin.  The Ultraverse balloon was then repositioned 2 additional times and the angioplasty extended across the entire length of the tibioperoneal trunk and into the distal several centimeters of the popliteal.  Inflations were between 6 atm and 10 atm.  All 3 inflations for 1 minute.  Follow-up imaging now demonstrated patency of the distal peroneal tibioperoneal trunk and peroneal with contrast filling the posterior tibial as well.  Therefore a 4 mm x 80 mm Lutonix drug-eluting balloon was advanced down the wire and positioned with its distal marker in the peroneal artery and extending into the popliteal.  Inflation was to 6 atm for 2 full minutes.  Follow-up imaging demonstrated an excellent result with less than 5% residual stenosis.  Distal runoff was then reassessed and there is now two-vessel runoff to the ankle with a dominant artery being the peroneal.  After review of these images the sheath is pulled into the left external iliac oblique of the common femoral is obtained and a Star close device deployed. There no immediate Complications.  Findings: The abdominal aorta was studied recently and therefore aortogram was not performed.  The common and external iliac arteries are widely patent bilaterally.  The right common femoral is patent as is the profunda femoris, although the profunda femoris demonstrates moderate to severe distal occlusive disease.  The SFA does not have a hemodynamically significant stenosis.  The distal popliteal demonstrates increasing disease with a greater than 60% stenosis in its distal several centimeters and the trifurcation is heavily diseased with occlusion of the anterior tibial, posterior tibial and tibioperoneal trunk and proximal  peroneal  Following Crosser atherectomy the posterior tibial now is now good patent in-line flow and looks good with less than 5% residual stenosis in its proximal two thirds.  Angioplasty with atherectomy of the peroneal tibioperoneal trunk and distal popliteal yields an excellent result  with less than 10% residual stenosis.   Disposition: Patient was taken to the recovery room in stable condition having tolerated the procedure well.  [name] 09/25/2017,11:52 AM

## 2017-09-25 NOTE — H&P (Signed)
[place] [facility] History & Physical Update  The patient was interviewed and re-examined.  The patient's previous History and Physical has been reviewed and is unchanged.  There is no change in the plan of care. We plan to proceed with the scheduled procedure.  Levora DredgeGregory Muneer Leider, MD  09/25/2017, 8:48 AM

## 2017-09-25 NOTE — Progress Notes (Signed)
CORONA, POPOVICH ([id number]) Visit Report for 09/21/2017 Chief Complaint Document Details Patient Name: [name], [name] Date of Service: 09/21/2017 11:00 AM Medical Record Number: [id number] Patient Account Number: [id number] Date of Birth/Sex: December 20, 1920 (84 y.o. Female) Treating RN: Curtis Sites Primary Care Provider: Dewaine Oats Other Clinician: Referring Provider: Dewaine Oats Treating Provider/Extender: Linwood Dibbles, HOYT Weeks in Treatment: 24 Information Obtained from: Patient Chief Complaint Patient seen for complaints of Non-Healing Wound to the right 1st through 4th toes Electronic Signature(s) Signed: 09/21/2017 6:07:32 PM By: Lenda Kelp PA-C Entered By: Lenda Kelp on 09/21/2017 11:41:49 [name], [name] ([id number]) -------------------------------------------------------------------------------- HPI Details Patient Name: [name] Bayley L. Date of Service: 09/21/2017 11:00 AM Medical Record Number: [id number] Patient Account Number: [id number] Date of Birth/Sex: Jul 02, 1921 (84 y.o. Female) Treating RN: Curtis Sites Primary Care Provider: Dewaine Oats Other Clinician: Referring Provider: Dewaine Oats Treating Provider/Extender: Linwood Dibbles, HOYT Weeks in Treatment: 24 History of Present Illness HPI Description: very pleasant 84 year old patient, no history of diabetes or smoking has had a ulceration on the right second toe dorsum for about 3 months. Besides essential hypertension and gout she does not have any other significant problems and has never been a smoker. 04/13/17 on evaluation today patient appears to be doing about the same in regard to her right second toe ulcer. We did get the results for x-ray which showed osteopenia without any specific bone abnormality osteomyelitis. She does continue to have discomfort with palpation of the wound although secondary to mental status is unable to rate or describe this pain. No fevers, chills, nausea, or vomiting noted at this  time. 04/27/2017 -- over the last couple of weeks the patient has had some issues with a urinary tract infection and some mental status changes and after review today, I have recommended we do not do the arterial studies recommended by my colleague last week, in view of the fact that the patient's wound has improved considerably and I do not want to put her through further investigations, which may not change the treatment plan. 05/04/2017 -- the patient was seen by her podiatrist and also has a CT scan pending to workup her recent mental status changes. 05/13/17 on evaluation today patient appears to be doing about the same. The wound is not significantly smaller although there is more granulation than when I last saw this. We are continuing to manage this more conservatively as patient's family does not want to delve deeper into a lot of testing they would prefer to see how the wound progresses. Fortunately there is no evidence of infection. No fevers, chills, nausea, or vomiting noted at this time. She has been tolerating the dressing changes without complication and has only minimal discomfort that she is unable to rate or describe her pain secondary to mental status. 06/15/2017 -- 84 year old patient with significant dementia is being very well taken care of by her caregivers but the big plan is for palliative care only and no aggressive treatment is to be done. 06/29/2017 -- the caregiver confirms that the family and the power of attorney did not want any aggressive treatment to be done and we will continue with palliative care. She is a bit brighter today and seems to be more oriented in time and place. 07/13/2017 -- 84 year old with a open wound on the right second toe dorsum probing down to bone has had a stable toe without any evidence of cellulitis and the power of attorney does not want any aggressive treatment to be done  unless absolutely essential. We have been giving her palliative  care for a while and today she is very awake and alert and seems well oriented in time and space. 07/27/17 on evaluation today patient unfortunately has two new ulcers one over the medial fourth toe and one on the lateral second toe just adjacent to the main ulcerated lesion. This almost appears to be possibly moisture associated breakdown although they are very circular in nature which does lend itself to an arterial insufficiency ulcer. There does not appear to be any purulent discharge from any site at this point. Patient did see Dr. Alberteen Spindle recently and he has recommended proceeding with the arterial studies at this point so that if any type of amputation is necessary they will know what needs to be done without having to guess or go and blindly. Obviously I think this is a good idea as it's possible that amputation may become necessary. He also placed her on Augmentin for six weeks. 08/10/17 on evaluation today patient appears to be doing about the same in regard to her right foot ulcers. Unfortunately the new thing that is noted is that she has an ulcer on the medial aspect of her third toe. The second toe ulcer on the dorsal aspect appears to be about the same although I'm not sure that she has not in the interim had a dorsal tendon rupture as the distal portion of her toe seems to be very loose almost as if the tendon is no longer attached on the dorsal aspect. There is [name], [name] ([id number]) also bone exposed obviously in the wound bed. she has not had her vascular procedure yet although she does have this on February 5 which is an angiogram. There are going to see if any intervention can be attempted in order to improve her blood flow to the right lower extremity. In the meantime she has been tolerating the dressing changes decently well she does have a lot of pain according to the power of attorney and caregiver who is with her today. 09/07/17 on evaluation today patient appears to be  doing worse in regard to her right foot ulcers. She continues to have the ulceration of the second toe although she now has ulcers on the lateral first toe, medial third toe, and medial fourth toe. Obviously this seems to be getting worse and I really do not believe these are pressure areas but rather arterial ulcers. She did have the vascular procedure but unfortunately she had plaque throughout the leg and the procedure was going on so long that it was terminated by the vascular surgeon even though she still has significant blockages below the knee. He was afraid that she was getting too much contrast dye especially considering her age. The plan has been to potentially go back in and further trying to open up blood flow to the extremity in future weeks although her appointment is not until the fourth or fifth of March 2 see the the surgeon back just for reevaluation and then they have to schedule the procedure if there are gonna proceed with another procedure. In the meantime her foot really does not appear to be doing very well at all. She did have a posterior tibial pulse that was not palpable but rather Dopplerable. However I could not get a Doppler signal in the dorsalis pedis region at all. There was also no palpable pulse. I do believe that we are coming close to needing to make a decision either to  potentially proceed with amputation or else potentially to continue with palliative care. I did review patient's MRI results which revealed osteomyelitis of the right second toe. I did also review the vascular note in regard to the revascularization. It does appear that they were able to get quite a bit done in the upper leg although the lower leg has not really been addressed at this point. This again is on the right. 09/21/17 on evaluation today patient appears to be doing significantly better compared to my last evaluation with her. Overall her right second toe shows excellent epithelialization  which to me was completely unexpected as previously bone was noted. She seems to have granulated and well and then subsequently the majority the wound has covered over with epithelial tissue. The majority of her other sores on her toes have also improved this is a completely different picture from the last time I saw her although it is definitely a welcome picture. She is currently on antibiotics as well. Patient also has her next appointment scheduled for Tuesday, September 25, 2017 where she is going to have the repeat right angiogram to help clean out the remainder of the blockage noted. Electronic Signature(s) Signed: 09/21/2017 6:07:32 PM By: Lenda Kelp PA-C Entered By: Lenda Kelp on 09/21/2017 17:09:21 [name], [name] ([id number]) -------------------------------------------------------------------------------- Physical Exam Details Patient Name: [name], [name] Date of Service: 09/21/2017 11:00 AM Medical Record Number: [id number] Patient Account Number: [id number] Date of Birth/Sex: 10-Nov-1920 (84 y.o. Female) Treating RN: Curtis Sites Primary Care Provider: Dewaine Oats Other Clinician: Referring Provider: TATE, Katherina Right Treating Provider/Extender: STONE III, HOYT Weeks in Treatment: 24 Constitutional Thin and well-hydrated in no acute distress. Respiratory normal breathing without difficulty. clear to auscultation bilaterally. Cardiovascular regular rate and rhythm with normal S1, S2. Psychiatric this patient is able to make decisions and demonstrates good insight into disease process. Alert and Oriented x 3. pleasant and cooperative. Notes Patient's wounds at this point appear to be doing very well there is no need for sharp debridement today and she seems to be doing much better other than having some swelling of the right lower extremity which has been present since her last angiogram and procedure. Electronic Signature(s) Signed: 09/21/2017 6:07:32 PM By: Lenda Kelp  PA-C Entered By: Lenda Kelp on 09/21/2017 17:09:59 Bentsen, [name] ([id number]) -------------------------------------------------------------------------------- Physician Orders Details Patient Name: Mesina, Francelia L. Date of Service: 09/21/2017 11:00 AM Medical Record Number: [id number] Patient Account Number: [id number] Date of Birth/Sex: 1921/03/24 (84 y.o. Female) Treating RN: Curtis Sites Primary Care Provider: Dewaine Oats Other Clinician: Referring Provider: Dewaine Oats Treating Provider/Extender: Linwood Dibbles, HOYT Weeks in Treatment: 24 Verbal / Phone Orders: No Diagnosis Coding ICD-10 Coding Code Description L97.512 Non-pressure chronic ulcer of other part of right foot with fat layer exposed I73.9 Peripheral vascular disease, unspecified I10 Essential (primary) hypertension Wound Cleansing Wound #1 Right Toe Second o Clean wound with Normal Saline. o May Shower, gently pat wound dry prior to applying new dressing. Wound #2 Right Toe Great o Clean wound with Normal Saline. o May Shower, gently pat wound dry prior to applying new dressing. Wound #4 Right Toe Fourth o Clean wound with Normal Saline. o May Shower, gently pat wound dry prior to applying new dressing. Primary Wound Dressing Wound #1 Right Toe Second o [place] Non-Adherent Wound #2 Right Toe Great o [place] Non-Adherent Wound #4 Right Toe Fourth o [place] Non-Adherent Dressing Change Frequency Wound #1 Right Toe Second o Other: - change the bandage  on shower days or PRN if soiled Wound #2 Right Toe Great o Other: - change the bandage on shower days or PRN if soiled Wound #4 Right Toe Fourth o Other: - change the bandage on shower days or PRN if soiled Follow-up Appointments Wound #1 Right Toe Second o Return Appointment in 2 weeks. TIMIKA, MUENCH ([id number]) Wound #2 Right Toe Great o Return Appointment in 2 weeks. Wound #4 Right Toe Fourth o Return Appointment  in 2 weeks. Off-Loading Wound #1 Right Toe Second o Other: - continue wearing shoes that do not rub the top of the toe Wound #2 Right Toe Great o Other: - continue wearing shoes that do not rub the top of the toe Wound #4 Right Toe Fourth o Other: - continue wearing shoes that do not rub the top of the toe Additional Orders / Instructions Wound #1 Right Toe Second o Increase protein intake. o Other: - Please add vitamin A, vitamin C and zinc supplements to your diet Wound #2 Right Toe Great o Increase protein intake. o Other: - Please add vitamin A, vitamin C and zinc supplements to your diet Wound #4 Right Toe Fourth o Increase protein intake. o Other: - Please add vitamin A, vitamin C and zinc supplements to your diet Electronic Signature(s) Signed: 09/21/2017 4:45:57 PM By: Curtis Sites Signed: 09/21/2017 6:07:32 PM By: Lenda Kelp PA-C Entered By: Curtis Sites on 09/21/2017 11:50:15 [name], [name] ([id number]) -------------------------------------------------------------------------------- Problem List Details Patient Name: [name], [name] Date of Service: 09/21/2017 11:00 AM Medical Record Number: [id number] Patient Account Number: [id number] Date of Birth/Sex: [birth year] (84 y.o. Female) Treating RN: Curtis Sites Primary Care Provider: Dewaine Oats Other Clinician: Referring Provider: Dewaine Oats Treating Provider/Extender: Linwood Dibbles, HOYT Weeks in Treatment: 24 Active Problems ICD-10 Encounter Code Description Active Date Diagnosis L97.512 Non-pressure chronic ulcer of other part of right foot with fat layer 04/06/2017 Yes exposed I73.9 Peripheral vascular disease, unspecified 04/06/2017 Yes I10 Essential (primary) hypertension 04/06/2017 Yes Inactive Problems Resolved Problems Electronic Signature(s) Signed: 09/21/2017 6:07:32 PM By: Lenda Kelp PA-C Entered By: Lenda Kelp on 09/21/2017 11:41:42 [name], [name]  ([id number]) -------------------------------------------------------------------------------- Progress Note Details Patient Name: [name], [name] Date of Service: 09/21/2017 11:00 AM Medical Record Number: [id number] Patient Account Number: [id number] Date of Birth/Sex: [birth year] (84 y.o. Female) Treating RN: Curtis Sites Primary Care Provider: TATE, Katherina Right Other Clinician: Referring Provider: Dewaine Oats Treating Provider/Extender: Linwood Dibbles, HOYT Weeks in Treatment: 24 Subjective Chief Complaint Information obtained from Patient Patient seen for complaints of Non-Healing Wound to the right 1st through 4th toes History of Present Illness (HPI) very pleasant 84 year old patient, no history of diabetes or smoking has had a ulceration on the right second toe dorsum for about 3 months. Besides essential hypertension and gout she does not have any other significant problems and has never been a smoker. 04/13/17 on evaluation today patient appears to be doing about the same in regard to her right second toe ulcer. We did get the results for x-ray which showed osteopenia without any specific bone abnormality osteomyelitis. She does continue to have discomfort with palpation of the wound although secondary to mental status is unable to rate or describe this pain. No fevers, chills, nausea, or vomiting noted at this time. 04/27/2017 -- over the last couple of weeks the patient has had some issues with a urinary tract infection and some mental status changes and after review today, I have recommended we do not do the arterial studies  recommended by my colleague last week, in view of the fact that the patient's wound has improved considerably and I do not want to put her through further investigations, which may not change the treatment plan. 05/04/2017 -- the patient was seen by her podiatrist and also has a CT scan pending to workup her recent mental status changes. 05/13/17 on evaluation today  patient appears to be doing about the same. The wound is not significantly smaller although there is more granulation than when I last saw this. We are continuing to manage this more conservatively as patient's family does not want to delve deeper into a lot of testing they would prefer to see how the wound progresses. Fortunately there is no evidence of infection. No fevers, chills, nausea, or vomiting noted at this time. She has been tolerating the dressing changes without complication and has only minimal discomfort that she is unable to rate or describe her pain secondary to mental status. 06/15/2017 -- 84 year old patient with significant dementia is being very well taken care of by her caregivers but the big plan is for palliative care only and no aggressive treatment is to be done. 06/29/2017 -- the caregiver confirms that the family and the power of attorney did not want any aggressive treatment to be done and we will continue with palliative care. She is a bit brighter today and seems to be more oriented in time and place. 07/13/2017 -- 84 year old with a open wound on the right second toe dorsum probing down to bone has had a stable toe without any evidence of cellulitis and the power of attorney does not want any aggressive treatment to be done unless absolutely essential. We have been giving her palliative care for a while and today she is very awake and alert and seems well oriented in time and space. 07/27/17 on evaluation today patient unfortunately has two new ulcers one over the medial fourth toe and one on the lateral second toe just adjacent to the main ulcerated lesion. This almost appears to be possibly moisture associated breakdown although they are very circular in nature which does lend itself to an arterial insufficiency ulcer. There does not appear to be any purulent discharge from any site at this point. Patient did see Dr. Alberteen Spindleline recently and he has recommended  proceeding with the arterial studies at this point so that if any type of amputation is necessary they will know what needs to be done without having to guess or go and blindly. Obviously I think this is a good idea as it's possible that amputation may become [name], [name] ([id number]) necessary. He also placed her on Augmentin for six weeks. 08/10/17 on evaluation today patient appears to be doing about the same in regard to her right foot ulcers. Unfortunately the new thing that is noted is that she has an ulcer on the medial aspect of her third toe. The second toe ulcer on the dorsal aspect appears to be about the same although I'm not sure that she has not in the interim had a dorsal tendon rupture as the distal portion of her toe seems to be very loose almost as if the tendon is no longer attached on the dorsal aspect. There is also bone exposed obviously in the wound bed. she has not had her vascular procedure yet although she does have this on February 5 which is an angiogram. There are going to see if any intervention can be attempted in order to improve her blood flow  to the right lower extremity. In the meantime she has been tolerating the dressing changes decently well she does have a lot of pain according to the power of attorney and caregiver who is with her today. 09/07/17 on evaluation today patient appears to be doing worse in regard to her right foot ulcers. She continues to have the ulceration of the second toe although she now has ulcers on the lateral first toe, medial third toe, and medial fourth toe. Obviously this seems to be getting worse and I really do not believe these are pressure areas but rather arterial ulcers. She did have the vascular procedure but unfortunately she had plaque throughout the leg and the procedure was going on so long that it was terminated by the vascular surgeon even though she still has significant blockages below the knee. He was afraid that she  was getting too much contrast dye especially considering her age. The plan has been to potentially go back in and further trying to open up blood flow to the extremity in future weeks although her appointment is not until the fourth or fifth of March 2 see the the surgeon back just for reevaluation and then they have to schedule the procedure if there are gonna proceed with another procedure. In the meantime her foot really does not appear to be doing very well at all. She did have a posterior tibial pulse that was not palpable but rather Dopplerable. However I could not get a Doppler signal in the dorsalis pedis region at all. There was also no palpable pulse. I do believe that we are coming close to needing to make a decision either to potentially proceed with amputation or else potentially to continue with palliative care. I did review patient's MRI results which revealed osteomyelitis of the right second toe. I did also review the vascular note in regard to the revascularization. It does appear that they were able to get quite a bit done in the upper leg although the lower leg has not really been addressed at this point. This again is on the right. 09/21/17 on evaluation today patient appears to be doing significantly better compared to my last evaluation with her. Overall her right second toe shows excellent epithelialization which to me was completely unexpected as previously bone was noted. She seems to have granulated and well and then subsequently the majority the wound has covered over with epithelial tissue. The majority of her other sores on her toes have also improved this is a completely different picture from the last time I saw her although it is definitely a welcome picture. She is currently on antibiotics as well. Patient also has her next appointment scheduled for Tuesday, September 25, 2017 where she is going to have the repeat right angiogram to help clean out the remainder of the  blockage noted. Patient History Information obtained from Patient. Social History Never smoker, Marital Status - Widowed, Alcohol Use - Never, Drug Use - No History, Caffeine Use - Daily. Review of Systems (ROS) Constitutional Symptoms (General Health) Denies complaints or symptoms of Fever, Chills. Respiratory The patient has no complaints or symptoms. Cardiovascular The patient has no complaints or symptoms. [name], [name] ([id number]) Objective Constitutional Thin and well-hydrated in no acute distress. Vitals Time Taken: 11:24 AM, Height: 59 in, Weight: 101 lbs, BMI: 20.4, Temperature: 98.2 F, Pulse: 49 bpm, Respiratory Rate: 16 breaths/min, Blood Pressure: 113/52 mmHg. Respiratory normal breathing without difficulty. clear to auscultation bilaterally. Cardiovascular regular rate and rhythm with  normal S1, S2. Psychiatric this patient is able to make decisions and demonstrates good insight into disease process. Alert and Oriented x 3. pleasant and cooperative. General Notes: Patient's wounds at this point appear to be doing very well there is no need for sharp debridement today and she seems to be doing much better other than having some swelling of the right lower extremity which has been present since her last angiogram and procedure. Integumentary (Hair, Skin) Wound #1 status is Open. Original cause of wound was Gradually Appeared. The wound is located on the Right Toe Second. The wound measures 0.9cm length x 0.3cm width x 0.2cm depth; 0.212cm^2 area and 0.042cm^3 volume. There is Fat Layer (Subcutaneous Tissue) Exposed exposed. There is no tunneling or undermining noted. There is a medium amount of serosanguineous drainage noted. There is medium (34-66%) red granulation within the wound bed. There is a medium (34- 66%) amount of necrotic tissue within the wound bed including Adherent Slough. The periwound skin appearance exhibited: Dry/Scaly. The periwound skin appearance  did not exhibit: Callus, Crepitus, Excoriation, Induration, Rash, Scarring, Maceration, Atrophie Blanche, Cyanosis, Ecchymosis, Hemosiderin Staining, Mottled, Pallor, Rubor, Erythema. Periwound temperature was noted as No Abnormality. The periwound has tenderness on palpation. Wound #2 status is Open. Original cause of wound was Gradually Appeared. The wound is located on the Right Toe Great. The wound measures 0.1cm length x 0.1cm width x 0.1cm depth; 0.008cm^2 area and 0.001cm^3 volume. There is Fat Layer (Subcutaneous Tissue) Exposed exposed. There is no tunneling or undermining noted. There is a medium amount of serosanguineous drainage noted. There is medium (34-66%) red, pink granulation within the wound bed. There is a medium (34-66%) amount of necrotic tissue within the wound bed including Adherent Slough. The periwound skin appearance exhibited: Callus. The periwound skin appearance did not exhibit: Crepitus, Excoriation, Induration, Rash, Scarring, Dry/Scaly, Maceration, Atrophie Blanche, Cyanosis, Ecchymosis, Hemosiderin Staining, Mottled, Pallor, Rubor, Erythema. Wound #3 status is Healed - Epithelialized. Original cause of wound was Gradually Appeared. The wound is located on the Right Toe Third. The wound measures 0cm length x 0cm width x 0cm depth; 0cm^2 area and 0cm^3 volume. Wound #4 status is Open. Original cause of wound was Gradually Appeared. The wound is located on the Right Toe Fourth. The wound measures 0.3cm length x 0.5cm width x 0.1cm depth; 0.118cm^2 area and 0.012cm^3 volume. There is Fat Layer (Subcutaneous Tissue) Exposed exposed. There is no tunneling or undermining noted. There is a medium amount of serosanguineous drainage noted. The wound margin is flat and intact. There is medium (34-66%) red, pink granulation within the wound bed. There is a medium (34-66%) amount of necrotic tissue within the wound bed. The periwound skin appearance did not exhibit: Callus,  Crepitus, Excoriation, Induration, Rash, Scarring, Dry/Scaly, Maceration, Atrophie Blanche, Cyanosis, Ecchymosis, Hemosiderin Staining, Mottled, Pallor, Rubor, Erythema. Periwound temperature was noted as No Abnormality. [name], [name] ([id number]) Assessment Active Problems ICD-10 L97.512 - Non-pressure chronic ulcer of other part of right foot with fat layer exposed I73.9 - Peripheral vascular disease, unspecified I10 - Essential (primary) hypertension Plan Wound Cleansing: Wound #1 Right Toe Second: Clean wound with Normal Saline. May Shower, gently pat wound dry prior to applying new dressing. Wound #2 Right Toe Great: Clean wound with Normal Saline. May Shower, gently pat wound dry prior to applying new dressing. Wound #4 Right Toe Fourth: Clean wound with Normal Saline. May Shower, gently pat wound dry prior to applying new dressing. Primary Wound Dressing: Wound #1 Right Toe Second:  [place] Non-Adherent Wound #2 Right Toe Great: [place] Non-Adherent Wound #4 Right Toe Fourth: [place] Non-Adherent Dressing Change Frequency: Wound #1 Right Toe Second: Other: - change the bandage on shower days or PRN if soiled Wound #2 Right Toe Great: Other: - change the bandage on shower days or PRN if soiled Wound #4 Right Toe Fourth: Other: - change the bandage on shower days or PRN if soiled Follow-up Appointments: Wound #1 Right Toe Second: Return Appointment in 2 weeks. Wound #2 Right Toe Great: Return Appointment in 2 weeks. Wound #4 Right Toe Fourth: Return Appointment in 2 weeks. Off-Loading: Wound #1 Right Toe Second: Other: - continue wearing shoes that do not rub the top of the toe Wound #2 Right Toe Great: Other: - continue wearing shoes that do not rub the top of the toe Wound #4 Right Toe Fourth: Other: - continue wearing shoes that do not rub the top of the toe Additional Orders / Instructions: Wound #1 Right Toe Second: Increase protein intake. Other: -  Please add vitamin A, vitamin C and zinc supplements to your diet Skeels, [name] ([id number]) Wound #2 Right Toe Great: Increase protein intake. Other: - Please add vitamin A, vitamin C and zinc supplements to your diet Wound #4 Right Toe Fourth: Increase protein intake. Other: - Please add vitamin A, vitamin C and zinc supplements to your diet I'm going to recommend at this point that we continue with the Current wound care measures since she seems to be doing so well. If anything changes in the interim patient's caregiver will contact our office for additional recommendations. Otherwise I recommend that we continue with the orders per above. Please see above for specific wound care orders. We will see patient for re-evaluation in 1 week(s) here in the clinic. If anything worsens or changes patient will contact our office for additional recommendations. Electronic Signature(s) Signed: 09/21/2017 6:07:32 PM By: Lenda Kelp PA-C Entered By: Lenda Kelp on 09/21/2017 17:10:31 [name], [name] ([id number]) -------------------------------------------------------------------------------- ROS/PFSH Details Patient Name: [name] Bayley L. Date of Service: 09/21/2017 11:00 AM Medical Record Number: [id number] Patient Account Number: [id number] Date of Birth/Sex: 08-09-1920 (84 y.o. Female) Treating RN: Curtis Sites Primary Care Provider: TATE, Katherina Right Other Clinician: Referring Provider: Dewaine Oats Treating Provider/Extender: Linwood Dibbles, HOYT Weeks in Treatment: 24 Information Obtained From Patient Wound History Do you currently have one or more open woundso Yes How many open wounds do you currently haveo 1 Approximately how long have you had your woundso 3 months How have you been treating your wound(s) until nowo neosporin Has your wound(s) ever healed and then re-openedo No Have you had any lab work done in the past montho No Have you tested positive for an antibiotic resistant organism  (MRSA, VRE)o No Have you tested positive for osteomyelitis (bone infection)o No Have you had any tests for circulation on your legso No Constitutional Symptoms (General Health) Complaints and Symptoms: Negative for: Fever; Chills Respiratory Complaints and Symptoms: No Complaints or Symptoms Cardiovascular Complaints and Symptoms: No Complaints or Symptoms Medical History: Positive for: Hypertension Musculoskeletal Medical History: Positive for: Gout; Osteoarthritis Immunizations Pneumococcal Vaccine: Received Pneumococcal Vaccination: Yes Immunization Notes: up to date Implantable Devices Family and Social History Never smoker; Marital Status - Widowed; Alcohol Use: Never; Drug Use: No History; Caffeine Use: Daily; Financial Concerns: No; Food, Clothing or Shelter Needs: No; Support System Lacking: No; Transportation Concerns: No; Advanced Sollers, Elorah L. ([id number]) Directives: Yes (Not Provided); Patient does not want information on Advanced Directives;  Medical Power of Attorney: Yes - Ardeen Jourdain - niece (Not Provided) Physician Affirmation I have reviewed and agree with the above information. Electronic Signature(s) Signed: 09/21/2017 6:07:32 PM By: Lenda Kelp PA-C Signed: 09/24/2017 4:28:14 PM By: Curtis Sites Entered By: Lenda Kelp on 09/21/2017 17:09:42 Kot, Renad LMarland Kitchen ([id number]) -------------------------------------------------------------------------------- SuperBill Details Patient Name: Covault, Lavine L. Date of Service: 09/21/2017 Medical Record Number: [id number] Patient Account Number: [id number] Date of Birth/Sex: 1920-07-27 (84 y.o. Female) Treating RN: Curtis Sites Primary Care Provider: Dewaine Oats Other Clinician: Referring Provider: Dewaine Oats Treating Provider/Extender: STONE III, HOYT Weeks in Treatment: 24 Diagnosis Coding ICD-10 Codes Code Description L97.512 Non-pressure chronic ulcer of other part of right foot with fat layer  exposed I73.9 Peripheral vascular disease, unspecified I10 Essential (primary) hypertension Facility Procedures CPT4 Code: [id number] Description: 99214 - WOUND CARE VISIT-LEV 4 EST PT Modifier: Quantity: 1 Physician Procedures CPT4 Code: [id number] Description: 99213 - WC PHYS LEVEL 3 - EST PT ICD-10 Diagnosis Description L97.512 Non-pressure chronic ulcer of other part of right foot with fat I73.9 Peripheral vascular disease, unspecified I10 Essential (primary) hypertension Modifier: layer exposed Quantity: 1 Electronic Signature(s) Signed: 09/21/2017 6:07:32 PM By: Lenda Kelp PA-C Entered By: Lenda Kelp on 09/21/2017 17:10:47

## 2017-09-27 NOTE — Progress Notes (Addendum)
DARSI, TIEN ([id number]) Visit Report for 09/21/2017 Arrival Information Details Patient Name: [name] Date of Service: 09/21/2017 11:00 AM Medical Record Number: [id number] Patient Account Number: [id number] Date of Birth/Sex: 1920/12/03 (84 y.o. F) Treating RN: Renne Crigler Primary Care [name] Other Clinician: Referring Maytte Jacot: Dewaine Oats Treating Luisfernando Brightwell/Extender: Linwood Dibbles, HOYT Weeks in Treatment: 24 Visit Information History Since Last Visit All ordered tests and consults were completed: No Patient Arrived: Wheel Chair Added or deleted any medications: Yes Arrival Time: 11:23 Any new allergies or adverse reactions: No Accompanied By: caregiver Had a fall or experienced change in No activities of daily living that may affect Transfer Assistance: None risk of falls: Patient Requires Transmission-Based No Signs or symptoms of abuse/neglect since last visito No Precautions: Hospitalized since last visit: No Patient Has Alerts: Yes Pain Present Now: No Electronic Signature(s) Signed: 09/26/2017 8:28:46 AM By: Renne Crigler Entered By: Renne Crigler on 09/21/2017 11:24:02 Blick, Armonie L. ([id number]) -------------------------------------------------------------------------------- Clinic Level of Care Assessment Details Patient Name: [name] Date of Service: 09/21/2017 11:00 AM Medical Record Number: [id number] Patient Account Number: [id number] Date of Birth/Sex: 10/22/1920 (84 y.o. F) Treating RN: Curtis Sites Primary Care Taren Dymek: Dewaine Oats Other Clinician: Referring Makyia Erxleben: Dewaine Oats Treating Latausha Flamm/Extender: Linwood Dibbles, HOYT Weeks in Treatment: 24 Clinic Level of Care Assessment Items TOOL 4 Quantity Score []  - Use when only an EandM is performed on FOLLOW-UP visit 0 ASSESSMENTS - Nursing Assessment / Reassessment X - Reassessment of Co-morbidities (includes updates in patient status) 1 10 X- 1 5 Reassessment of  Adherence to Treatment Plan ASSESSMENTS - Wound and Skin Assessment / Reassessment []  - Simple Wound Assessment / Reassessment - one wound 0 X- 4 5 Complex Wound Assessment / Reassessment - multiple wounds []  - 0 Dermatologic / Skin Assessment (not related to wound area) ASSESSMENTS - Focused Assessment []  - Circumferential Edema Measurements - multi extremities 0 []  - 0 Nutritional Assessment / Counseling / Intervention X- 1 5 Lower Extremity Assessment (monofilament, tuning fork, pulses) []  - 0 Peripheral Arterial Disease Assessment (using hand held doppler) ASSESSMENTS - Ostomy and/or Continence Assessment and Care []  - Incontinence Assessment and Management 0 []  - 0 Ostomy Care Assessment and Management (repouching, etc.) PROCESS - Coordination of Care X - Simple Patient / Family Education for ongoing care 1 15 []  - 0 Complex (extensive) Patient / Family Education for ongoing care []  - 0 Staff obtains Chiropractor, Records, Test Results / Process Orders []  - 0 Staff telephones HHA, Nursing Homes / Clarify orders / etc []  - 0 Routine Transfer to another Facility (non-emergent condition) []  - 0 Routine Hospital Admission (non-emergent condition) []  - 0 New Admissions / Manufacturing engineer / Ordering NPWT, Apligraf, etc. []  - 0 Emergency Hospital Admission (emergent condition) X- 1 10 Simple Discharge Coordination Wollschlager, Rhena L. ([id number]) []  - 0 Complex (extensive) Discharge Coordination PROCESS - Special Needs []  - Pediatric / Minor Patient Management 0 []  - 0 Isolation Patient Management []  - 0 Hearing / Language / Visual special needs []  - 0 Assessment of Community assistance (transportation, D/C planning, etc.) []  - 0 Additional assistance / Altered mentation []  - 0 Support Surface(s) Assessment (bed, cushion, seat, etc.) INTERVENTIONS - Wound Cleansing / Measurement []  - Simple Wound Cleansing - one wound 0 X- 3 5 Complex Wound Cleansing - multiple  wounds X- 1 5 Wound Imaging (photographs - any number of wounds) []  - 0 Wound Tracing (instead of photographs) []  - 0 Simple Wound  Measurement - one wound X- 3 5 Complex Wound Measurement - multiple wounds INTERVENTIONS - Wound Dressings X - Small Wound Dressing one or multiple wounds 3 10 []  - 0 Medium Wound Dressing one or multiple wounds []  - 0 Large Wound Dressing one or multiple wounds []  - 0 Application of Medications - topical []  - 0 Application of Medications - injection INTERVENTIONS - Miscellaneous []  - External ear exam 0 []  - 0 Specimen Collection (cultures, biopsies, blood, body fluids, etc.) []  - 0 Specimen(s) / Culture(s) sent or taken to Lab for analysis []  - 0 Patient Transfer (multiple staff / Nurse, adult / Similar devices) []  - 0 Simple Staple / Suture removal (25 or less) []  - 0 Complex Staple / Suture removal (26 or more) []  - 0 Hypo / Hyperglycemic Management (close monitor of Blood Glucose) []  - 0 Ankle / Brachial Index (ABI) - do not check if billed separately X- 1 5 Vital Signs Encina, Mirka L. ([id number]) Has the patient been seen at the hospital within the last three years: Yes Total Score: 135 Level Of Care: New/Established - Level 4 Electronic Signature(s) Signed: 09/21/2017 4:45:57 PM By: Curtis Sites Entered By: Curtis Sites on 09/21/2017 11:50:49 Ferner, Kierstynn L. ([id number]) -------------------------------------------------------------------------------- Encounter Discharge Information Details Patient Name: [name] Bayley L. Date of Service: 09/21/2017 11:00 AM Medical Record Number: [id number] Patient Account Number: [id number] Date of Birth/Sex: December 23, 1920 (84 y.o. F) Treating RN: Curtis Sites Primary Care Kinlie Janice: Dewaine Oats Other Clinician: Referring Puneet Selden: Dewaine Oats Treating Damone Fancher/Extender: Linwood Dibbles, HOYT Weeks in Treatment: 24 Encounter Discharge Information Items Discharge Pain Level: 0 Discharge Condition:  Stable Ambulatory Status: Wheelchair Discharge Destination: Nursing Home Transportation: Private Auto Accompanied By: caregiver Schedule Follow-up Appointment: Yes Medication Reconciliation completed and No provided to Patient/Care Copper Kirtley: Provided on Clinical Summary of Care: 09/21/2017 Form Type Recipient Paper Patient [facility] Electronic Signature(s) Signed: 09/21/2017 4:41:33 PM By: Alejandro Mulling Entered By: Alejandro Mulling on 09/21/2017 11:56:11 Silvey, Claribel L. ([id number]) -------------------------------------------------------------------------------- Lower Extremity Assessment Details Patient Name: [name], [name] Date of Service: 09/21/2017 11:00 AM Medical Record Number: [id number] Patient Account Number: [id number] Date of Birth/Sex: 08/13/1920 (84 y.o. F) Treating RN: Renne Crigler Primary Care Neziah Braley: TATE, Katherina [name] Other Clinician: Referring Amous Crewe: Dewaine Oats Treating Adraine Biffle/Extender: STONE III, HOYT Weeks in Treatment: 24 Edema Assessment Assessed: [Left: No] [[name]: No] [Left: Edema] [[name]: :] Calf Left: [name]: Point of Measurement: 33 cm From Medial Instep cm 32 cm Ankle Left: [name]: Point of Measurement: 8 cm From Medial Instep cm 20.9 cm Vascular Assessment Pulses: Dorsalis Pedis Palpable: [[name]:Yes] Posterior Tibial Extremity colors, hair growth, and conditions: Extremity Color: [[name]:Hyperpigmented] Hair Growth on Extremity: [[name]:No] Temperature of Extremity: [[name]:Warm] Capillary Refill: [[name]:< 3 seconds] Toe Nail Assessment Left: [name]: Thick: Yes Discolored: Yes Deformed: Yes Improper Length and Hygiene: No Electronic Signature(s) Signed: 09/26/2017 8:28:46 AM By: Renne Crigler Entered By: Renne Crigler on 09/21/2017 11:39:49 Balliet, Danasia L. ([id number]) -------------------------------------------------------------------------------- Multi Wound Chart Details Patient Name: [name], [name] Date of Service: 09/21/2017 11:00  AM Medical Record Number: [id number] Patient Account Number: [id number] Date of Birth/Sex: 19-Mar-1921 (84 y.o. F) Treating RN: Curtis Sites Primary Care Erhardt Dada: Dewaine Oats Other Clinician: Referring Gurshan Settlemire: Dewaine Oats Treating Zaylin Pistilli/Extender: STONE III, HOYT Weeks in Treatment: 24 Vital Signs Height(in): 59 Pulse(bpm): 49 Weight(lbs): 101 Blood Pressure(mmHg): 113/52 Body Mass Index(BMI): 20 Temperature(F): 98.2 Respiratory Rate 16 (breaths/min): Photos: [1:No Photos] [2:No Photos] [3:No Photos] Wound Location: [1:[name] Toe Second] [2:[name] Toe Great] [3:[name] Toe Third] Wounding Event: [1:Gradually Appeared] [2:Gradually  Appeared] [3:Gradually Appeared] Primary Etiology: [1:Arterial Insufficiency Ulcer] [2: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] Comorbid History: [1:Hypertension, Gout, Osteoarthritis] [2:Hypertension, Gout, Osteoarthritis] [3:N/A] Date Acquired: [1:12/26/2016] [2:07/18/2017] [3:07/18/2017] Weeks of Treatment: [1:24] [2:8] [3:8] Wound Status: [1:Open] [2:Open] [3:Healed - Epithelialized] Pending Amputation on [1:Yes] [2:No] [3:No] Presentation: Measurements L x W x D [1:0.9x0.3x0.2] [2:0.1x0.1x0.1] [3:0x0x0] (cm) Area (cm) : [1:0.212] [2:0.008] [3:0] Volume (cm) : [1:0.042] [2:0.001] [3:0] % Reduction in Area: [1:-125.50%] [2:98.20%] [3:100.00%] % Reduction in Volume: [1:-366.70%] [2:97.70%] [3:100.00%] Classification: [1:Full Thickness With Exposed Support Structures] [2:Full Thickness Without Exposed Support Structures] [3:Full Thickness Without Exposed Support Structures] Exudate Amount: [1:Medium] [2:Medium] [3:N/A] Exudate Type: [1:Serosanguineous] [2:Serosanguineous] [3:N/A] Exudate Color: [1:red, brown] [2:red, brown] [3:N/A] Wound Margin: [1:N/A] [2:N/A] [3:N/A] Granulation Amount: [1:Medium (34-66%)] [2:Medium (34-66%)] [3:N/A] Granulation Quality: [1:Red] [2:Red, Pink] [3:N/A] Necrotic Amount: [1:Medium (34-66%)]  [2:Medium (34-66%)] [3:N/A] Exposed Structures: [1:Fat Layer (Subcutaneous Tissue) Exposed: Yes] [2:Fat Layer (Subcutaneous Tissue) Exposed: Yes] [3:N/A] Epithelialization: [1:Small (1-33%)] [2:Small (1-33%)] [3:N/A] Periwound Skin Texture: [1:Excoriation: No Induration: No Callus: No Crepitus: No Rash: No Scarring: No] [2:Callus: Yes Excoriation: No Induration: No Crepitus: No Rash: No Scarring: No] [3:No Abnormalities Noted] Periwound Skin Moisture: [1:Dry/Scaly: Yes Maceration: No] [2:Maceration: No Dry/Scaly: No] [3:No Abnormalities Noted] Periwound Skin Color: Atrophie Blanche: No Atrophie Blanche: No No Abnormalities Noted Cyanosis: No Cyanosis: No Ecchymosis: No Ecchymosis: No Erythema: No Erythema: No Hemosiderin Staining: No Hemosiderin Staining: No Mottled: No Mottled: No Pallor: No Pallor: No Rubor: No Rubor: No Temperature: No Abnormality N/A N/A Tenderness on Palpation: Yes No No Wound Preparation: Ulcer Cleansing: Ulcer Cleansing: N/A Rinsed/Irrigated with Saline Rinsed/Irrigated with Saline Topical Anesthetic Applied: Topical Anesthetic Applied: Other: lidocaine 4% Other: lidocaine 4% Wound Number: 4 N/A N/A Photos: No Photos N/A N/A Wound Location: [name] Toe Fourth N/A N/A Wounding Event: Gradually Appeared N/A N/A Primary Etiology: Arterial Insufficiency Ulcer N/A N/A Comorbid History: Hypertension, Gout, N/A N/A Osteoarthritis Date Acquired: 09/07/2017 N/A N/A Weeks of Treatment: 2 N/A N/A Wound Status: Open N/A N/A Pending Amputation on No N/A N/A Presentation: Measurements L x W x D 0.3x0.5x0.1 N/A N/A (cm) Area (cm) : 0.118 N/A N/A Volume (cm) : 0.012 N/A N/A % Reduction in Area: 50.00% N/A N/A % Reduction in Volume: 50.00% N/A N/A Classification: Full Thickness Without N/A N/A Exposed Support Structures Exudate Amount: Medium N/A N/A Exudate Type: Serosanguineous N/A N/A Exudate Color: red, brown N/A N/A Wound Margin: Flat and Intact  N/A N/A Granulation Amount: Medium (34-66%) N/A N/A Granulation Quality: Red, Pink N/A N/A Necrotic Amount: Medium (34-66%) N/A N/A Exposed Structures: Fat Layer (Subcutaneous N/A N/A Tissue) Exposed: Yes Fascia: No Tendon: No Muscle: No Joint: No Bone: No Epithelialization: Small (1-33%) N/A N/A Periwound Skin Texture: Excoriation: No N/A N/A Induration: No Callus: No Crepitus: No Rash: No Scarring: No Periwound Skin Moisture: N/A N/A [name], [name] ([id number]) Maceration: No Dry/Scaly: No Periwound Skin Color: Atrophie Blanche: No N/A N/A Cyanosis: No Ecchymosis: No Erythema: No Hemosiderin Staining: No Mottled: No Pallor: No Rubor: No Temperature: No Abnormality N/A N/A Tenderness on Palpation: No N/A N/A Wound Preparation: Ulcer Cleansing: N/A N/A Rinsed/Irrigated with Saline Topical Anesthetic Applied: Other: lidocaine 4% Treatment Notes Electronic Signature(s) Signed: 09/21/2017 4:45:57 PM By: Curtis Sites Entered By: Curtis Sites on 09/21/2017 11:45:30 Radke, [name] ([id number]) -------------------------------------------------------------------------------- Multi-Disciplinary Care Plan Details Patient Name: Givan, Keamber L. Date of Service: 09/21/2017 11:00 AM Medical Record Number: [id number] Patient Account Number: [id number] Date of Birth/Sex: 02/08/1921 (84 y.o. F) Treating RN: Curtis Sites Primary Care Koran Seabrook:  TATE, DENNY Other Clinician: Referring Pooja Camuso: TATE, Katherina RightENNY Treating Degan Hanser/Extender: Linwood DibblesSTONE III, HOYT Weeks in Treatment: 24 Active Inactive Electronic Signature(s) Signed: 10/15/2017 9:45:58 AM By: Elliot GurneyWoody, BSN, RN, CWS, Kim RN, BSN Signed: 12/20/2017 4:05:52 PM By: Curtis Sitesorthy, Joanna Previous Signature: 09/21/2017 4:45:57 PM Version By: Curtis Sitesorthy, Joanna Entered By: Elliot GurneyWoody, BSN, RN, CWS, Kim on 10/15/2017 09:45:57 [name], [name] ([id number]) -------------------------------------------------------------------------------- Pain Assessment  Details Patient Name: [name], [name] Date of Service: 09/21/2017 11:00 AM Medical Record Number: [id number] Patient Account Number: [id number] Date of Birth/Sex: 06/21/1921 (84 y.o. F) Treating RN: Renne CriglerFlinchum, Cheryl Primary Care Alwaleed Obeso: Dewaine OatsATE, DENNY Other Clinician: Referring Violette Morneault: Dewaine OatsATE, DENNY Treating Lanya Bucks/Extender: STONE III, HOYT Weeks in Treatment: 24 Active Problems Location of Pain Severity and Description of Pain Patient Has Paino No Site Locations Pain Management and Medication Current Pain Management: Electronic Signature(s) Signed: 09/26/2017 8:28:46 AM By: Renne CriglerFlinchum, Cheryl Entered By: Renne CriglerFlinchum, Cheryl on 09/21/2017 11:24:25 Obeirne, [name] ([id number]) -------------------------------------------------------------------------------- Patient/Caregiver Education Details Patient Name: [name] FleetHARDY, Saia L. Date of Service: 09/21/2017 11:00 AM Medical Record Number: [id number] Patient Account Number: [id number] Date of Birth/Gender: 06/21/1921 (84 y.o. F) Treating RN: Phillis HaggisPinkerton, Debi Primary Care Physician: Dewaine OatsATE, DENNY Other Clinician: Referring Physician: Dewaine OatsATE, DENNY Treating Physician/Extender: Skeet SimmerSTONE III, HOYT Weeks in Treatment: 24 Education Assessment Education Provided To: Patient and Caregiver Education Topics Provided Wound/Skin Impairment: Handouts: Caring for Your Ulcer, Other: change dressing as ordered Methods: Demonstration, Explain/Verbal Responses: State content correctly Electronic Signature(s) Signed: 09/21/2017 4:41:33 PM By: Alejandro MullingPinkerton, Debra Entered By: Alejandro MullingPinkerton, Debra on 09/21/2017 11:56:36 Zarazua, Sarabelle L. ([id number]) -------------------------------------------------------------------------------- Wound Assessment Details Patient Name: [name], [name] Date of Service: 09/21/2017 11:00 AM Medical Record Number: [id number] Patient Account Number: [id number] Date of Birth/Sex: 06/21/1921 (84 y.o. F) Treating RN: Renne CriglerFlinchum, Cheryl Primary Care Altair Stanko:  TATE, Katherina RightENNY Other Clinician: Referring Terie Lear: Dewaine OatsATE, DENNY Treating Euretha Najarro/Extender: STONE III, HOYT Weeks in Treatment: 24 Wound Status Wound Number: 1 Primary Etiology: Arterial Insufficiency Ulcer Wound Location: [name] Toe Second Wound Status: Open Wounding Event: Gradually Appeared Comorbid History: Hypertension, Gout, Osteoarthritis Date Acquired: 12/26/2016 Weeks Of Treatment: 24 Clustered Wound: No Pending Amputation On Presentation Photos Photo Uploaded By: Renne CriglerFlinchum, Cheryl on 09/21/2017 16:20:20 Wound Measurements Length: (cm) 0.9 Width: (cm) 0.3 Depth: (cm) 0.2 Area: (cm) 0.212 Volume: (cm) 0.042 % Reduction in Area: -125.5% % Reduction in Volume: -366.7% Epithelialization: Small (1-33%) Tunneling: No Undermining: No Wound Description Full Thickness With Exposed Support Classification: Structures Exudate Medium Amount: Exudate Type: Serosanguineous Exudate Color: red, brown Foul Odor After Cleansing: No Slough/Fibrino Yes Wound Bed Granulation Amount: Medium (34-66%) Exposed Structure Granulation Quality: Red Fat Layer (Subcutaneous Tissue) Exposed: Yes Necrotic Amount: Medium (34-66%) Necrotic Quality: Adherent Slough Periwound Skin Texture Texture Color No Abnormalities Noted: No No Abnormalities Noted: No Torrance, Kenzli L. ([id number]) Callus: No Atrophie Blanche: No Crepitus: No Cyanosis: No Excoriation: No Ecchymosis: No Induration: No Erythema: No Rash: No Hemosiderin Staining: No Scarring: No Mottled: No Pallor: No Moisture Rubor: No No Abnormalities Noted: No Dry / Scaly: Yes Temperature / Pain Maceration: No Temperature: No Abnormality Tenderness on Palpation: Yes Wound Preparation Ulcer Cleansing: Rinsed/Irrigated with Saline Topical Anesthetic Applied: Other: lidocaine 4%, Electronic Signature(s) Signed: 09/26/2017 8:28:46 AM By: Renne CriglerFlinchum, Cheryl Entered By: Renne CriglerFlinchum, Cheryl on 09/21/2017 11:38:14 [name], [name]  ([id number]) -------------------------------------------------------------------------------- Wound Assessment Details Patient Name: [name], [name] Date of Service: 09/21/2017 11:00 AM Medical Record Number: [id number] Patient Account Number: [id number] Date of Birth/Sex: 06/21/1921 (84 y.o. F) Treating RN: Renne CriglerFlinchum, Cheryl Primary Care Twinkle Sockwell: Dewaine OatsATE, DENNY Other Clinician: Referring Mariadelcarmen Corella:  TATE, DENNY Treating Adrain Butrick/Extender: STONE III, HOYT Weeks in Treatment: 24 Wound Status Wound Number: 2 Primary Etiology: Arterial Insufficiency Ulcer Wound Location: [name] Toe Great Wound Status: Open Wounding Event: Gradually Appeared Comorbid History: Hypertension, Gout, Osteoarthritis Date Acquired: 07/18/2017 Weeks Of Treatment: 8 Clustered Wound: No Photos Photo Uploaded By: Renne Crigler on 09/21/2017 16:20:21 Wound Measurements Length: (cm) 0.1 Width: (cm) 0.1 Depth: (cm) 0.1 Area: (cm) 0.008 Volume: (cm) 0.001 % Reduction in Area: 98.2% % Reduction in Volume: 97.7% Epithelialization: Small (1-33%) Tunneling: No Undermining: No Wound Description Full Thickness Without Exposed Support Foul Classification: Structures Slou Exudate Medium Amount: Exudate Type: Serosanguineous Exudate Color: red, brown Odor After Cleansing: No gh/Fibrino Yes Wound Bed Granulation Amount: Medium (34-66%) Exposed Structure Granulation Quality: Red, Pink Fat Layer (Subcutaneous Tissue) Exposed: Yes Necrotic Amount: Medium (34-66%) Necrotic Quality: Adherent Slough Periwound Skin Texture Texture Color No Abnormalities Noted: No No Abnormalities Noted: No Callus: Yes Atrophie Blanche: No Syring, Laurian L. ([id number]) Crepitus: No Cyanosis: No Excoriation: No Ecchymosis: No Induration: No Erythema: No Rash: No Hemosiderin Staining: No Scarring: No Mottled: No Pallor: No Moisture Rubor: No No Abnormalities Noted: No Dry / Scaly: No Maceration: No Wound  Preparation Ulcer Cleansing: Rinsed/Irrigated with Saline Topical Anesthetic Applied: Other: lidocaine 4%, Electronic Signature(s) Signed: 09/26/2017 8:28:46 AM By: Renne Crigler Entered By: Renne Crigler on 09/21/2017 11:37:01 Burdett, Keziyah L. ([id number]) -------------------------------------------------------------------------------- Wound Assessment Details Patient Name: [name], [name] Date of Service: 09/21/2017 11:00 AM Medical Record Number: [id number] Patient Account Number: [id number] Date of Birth/Sex: 01-04-1921 (84 y.o. F) Treating RN: Renne Crigler Primary Care Nazaret Chea: TATE, Katherina [name] Other Clinician: Referring Esias Mory: Dewaine Oats Treating Rien Marland/Extender: STONE III, HOYT Weeks in Treatment: 24 Wound Status Wound Number: 3 Primary Etiology: Arterial Insufficiency Ulcer Wound Location: [name] Toe Third Wound Status: Healed - Epithelialized Wounding Event: Gradually Appeared Date Acquired: 07/18/2017 Weeks Of Treatment: 8 Clustered Wound: No Photos Photo Uploaded By: Renne Crigler on 09/21/2017 16:21:04 Wound Measurements Length: (cm) 0 Width: (cm) 0 Depth: (cm) 0 Area: (cm) 0 Volume: (cm) 0 % Reduction in Area: 100% % Reduction in Volume: 100% Wound Description Full Thickness Without Exposed Support Classification: Structures Periwound Skin Texture Texture Color No Abnormalities Noted: No No Abnormalities Noted: No Moisture No Abnormalities Noted: No Electronic Signature(s) Signed: 09/26/2017 8:28:46 AM By: Renne Crigler Entered By: Renne Crigler on 09/21/2017 11:36:41 [name], [name] ([id number]) -------------------------------------------------------------------------------- Wound Assessment Details Patient Name: [name], [name] Date of Service: 09/21/2017 11:00 AM Medical Record Number: [id number] Patient Account Number: [id number] Date of Birth/Sex: 05-07-1921 (84 y.o. F) Treating RN: Renne Crigler Primary Care Ammar Moffatt: TATE,  Katherina [name] Other Clinician: Referring Takao Lizer: Dewaine Oats Treating Tanayia Wahlquist/Extender: STONE III, HOYT Weeks in Treatment: 24 Wound Status Wound Number: 4 Primary Etiology: Arterial Insufficiency Ulcer Wound Location: [name] Toe Fourth Wound Status: Open Wounding Event: Gradually Appeared Comorbid History: Hypertension, Gout, Osteoarthritis Date Acquired: 09/07/2017 Weeks Of Treatment: 2 Clustered Wound: No Photos Photo Uploaded By: Renne Crigler on 09/21/2017 16:21:05 Wound Measurements Length: (cm) 0.3 Width: (cm) 0.5 Depth: (cm) 0.1 Area: (cm) 0.118 Volume: (cm) 0.012 % Reduction in Area: 50% % Reduction in Volume: 50% Epithelialization: Small (1-33%) Tunneling: No Undermining: No Wound Description Full Thickness Without Exposed Support Classification: Structures Wound Margin: Flat and Intact Exudate Medium Amount: Exudate Type: Serosanguineous Exudate Color: red, brown Foul Odor After Cleansing: No Slough/Fibrino Yes Wound Bed Granulation Amount: Medium (34-66%) Exposed Structure Granulation Quality: Red, Pink Fascia Exposed: No Necrotic Amount: Medium (34-66%) Fat Layer (Subcutaneous Tissue) Exposed: Yes Tendon Exposed:  No Muscle Exposed: No Joint Exposed: No Bone Exposed: No [name], [name] ([id number]) Periwound Skin Texture Texture Color No Abnormalities Noted: No No Abnormalities Noted: No Callus: No Atrophie Blanche: No Crepitus: No Cyanosis: No Excoriation: No Ecchymosis: No Induration: No Erythema: No Rash: No Hemosiderin Staining: No Scarring: No Mottled: No Pallor: No Moisture Rubor: No No Abnormalities Noted: No Dry / Scaly: No Temperature / Pain Maceration: No Temperature: No Abnormality Wound Preparation Ulcer Cleansing: Rinsed/Irrigated with Saline Topical Anesthetic Applied: Other: lidocaine 4%, Electronic Signature(s) Signed: 09/26/2017 8:28:46 AM By: Renne Crigler Entered By: Renne Crigler on 09/21/2017  11:38:41 [place]burn, Sharde L. ([id number]) -------------------------------------------------------------------------------- Vitals Details Patient Name: [place]on[place], Rees L. Date of Service: 09/21/2017 11:00 AM Medical Record Number: [id number] Patient Account Number: [id number] Date of Birth/Sex: [birth year] (84 y.o. F) Treating RN: Renne Crigler Primary Care Tanecia Mccay: TATE, Katherina [name] Other Clinician: Referring Yenty Bloch: TATE, Katherina [name] Treating Johann Gascoigne/Extender: STONE III, HOYT Weeks in Treatment: 24 Vital Signs Time Taken: 11:24 Temperature (F): 98.2 Height (in): 59 Pulse (bpm): 49 Weight (lbs): 101 Respiratory Rate (breaths/min): 16 Body Mass Index (BMI): 20.4 Blood Pressure (mmHg): 113/52 Reference Range: 80 - 120 mg / dl Electronic Signature(s) Signed: 09/26/2017 8:28:46 AM By: Renne Crigler Entered By: Renne Crigler on 09/21/2017 11:24:51

## 2017-10-05 ENCOUNTER — Ambulatory Visit: Payer: Self-pay | Admitting: Physician Assistant

## 2017-10-07 ENCOUNTER — Other Ambulatory Visit: Payer: Self-pay

## 2017-10-07 ENCOUNTER — Emergency Department: Payer: Medicare Other

## 2017-10-07 ENCOUNTER — Inpatient Hospital Stay
Admission: EM | Admit: 2017-10-07 | Discharge: 2017-10-15 | DRG: 064 | Disposition: E | Payer: Medicare Other | Attending: Specialist | Admitting: Specialist

## 2017-10-07 DIAGNOSIS — Z66 Do not resuscitate: Secondary | ICD-10-CM | POA: Diagnosis present

## 2017-10-07 DIAGNOSIS — F0391 Unspecified dementia with behavioral disturbance: Secondary | ICD-10-CM | POA: Diagnosis present

## 2017-10-07 DIAGNOSIS — Z7902 Long term (current) use of antithrombotics/antiplatelets: Secondary | ICD-10-CM

## 2017-10-07 DIAGNOSIS — Z7982 Long term (current) use of aspirin: Secondary | ICD-10-CM | POA: Diagnosis not present

## 2017-10-07 DIAGNOSIS — M869 Osteomyelitis, unspecified: Secondary | ICD-10-CM | POA: Diagnosis present

## 2017-10-07 DIAGNOSIS — R64 Cachexia: Secondary | ICD-10-CM | POA: Diagnosis present

## 2017-10-07 DIAGNOSIS — N179 Acute kidney failure, unspecified: Secondary | ICD-10-CM | POA: Diagnosis not present

## 2017-10-07 DIAGNOSIS — R29702 NIHSS score 2: Secondary | ICD-10-CM | POA: Diagnosis present

## 2017-10-07 DIAGNOSIS — K219 Gastro-esophageal reflux disease without esophagitis: Secondary | ICD-10-CM | POA: Diagnosis present

## 2017-10-07 DIAGNOSIS — I639 Cerebral infarction, unspecified: Secondary | ICD-10-CM | POA: Diagnosis not present

## 2017-10-07 DIAGNOSIS — I129 Hypertensive chronic kidney disease with stage 1 through stage 4 chronic kidney disease, or unspecified chronic kidney disease: Secondary | ICD-10-CM | POA: Diagnosis present

## 2017-10-07 DIAGNOSIS — I63511 Cerebral infarction due to unspecified occlusion or stenosis of right middle cerebral artery: Secondary | ICD-10-CM | POA: Diagnosis present

## 2017-10-07 DIAGNOSIS — Z515 Encounter for palliative care: Secondary | ICD-10-CM | POA: Diagnosis not present

## 2017-10-07 DIAGNOSIS — M1990 Unspecified osteoarthritis, unspecified site: Secondary | ICD-10-CM | POA: Diagnosis present

## 2017-10-07 DIAGNOSIS — M109 Gout, unspecified: Secondary | ICD-10-CM | POA: Diagnosis present

## 2017-10-07 DIAGNOSIS — I4891 Unspecified atrial fibrillation: Secondary | ICD-10-CM | POA: Diagnosis not present

## 2017-10-07 DIAGNOSIS — Z681 Body mass index (BMI) 19 or less, adult: Secondary | ICD-10-CM

## 2017-10-07 DIAGNOSIS — R402142 Coma scale, eyes open, spontaneous, at arrival to emergency department: Secondary | ICD-10-CM | POA: Diagnosis present

## 2017-10-07 DIAGNOSIS — Z8249 Family history of ischemic heart disease and other diseases of the circulatory system: Secondary | ICD-10-CM

## 2017-10-07 DIAGNOSIS — R1310 Dysphagia, unspecified: Secondary | ICD-10-CM | POA: Diagnosis present

## 2017-10-07 DIAGNOSIS — E860 Dehydration: Secondary | ICD-10-CM | POA: Diagnosis present

## 2017-10-07 DIAGNOSIS — M86671 Other chronic osteomyelitis, right ankle and foot: Secondary | ICD-10-CM | POA: Diagnosis present

## 2017-10-07 DIAGNOSIS — I10 Essential (primary) hypertension: Secondary | ICD-10-CM | POA: Diagnosis present

## 2017-10-07 DIAGNOSIS — I739 Peripheral vascular disease, unspecified: Secondary | ICD-10-CM | POA: Diagnosis present

## 2017-10-07 DIAGNOSIS — N184 Chronic kidney disease, stage 4 (severe): Secondary | ICD-10-CM | POA: Diagnosis present

## 2017-10-07 DIAGNOSIS — I351 Nonrheumatic aortic (valve) insufficiency: Secondary | ICD-10-CM | POA: Diagnosis not present

## 2017-10-07 DIAGNOSIS — Z7189 Other specified counseling: Secondary | ICD-10-CM | POA: Diagnosis not present

## 2017-10-07 DIAGNOSIS — R402252 Coma scale, best verbal response, oriented, at arrival to emergency department: Secondary | ICD-10-CM | POA: Diagnosis present

## 2017-10-07 DIAGNOSIS — G9340 Encephalopathy, unspecified: Secondary | ICD-10-CM | POA: Diagnosis present

## 2017-10-07 DIAGNOSIS — E43 Unspecified severe protein-calorie malnutrition: Secondary | ICD-10-CM | POA: Diagnosis present

## 2017-10-07 DIAGNOSIS — R627 Adult failure to thrive: Secondary | ICD-10-CM | POA: Diagnosis present

## 2017-10-07 DIAGNOSIS — R402362 Coma scale, best motor response, obeys commands, at arrival to emergency department: Secondary | ICD-10-CM | POA: Diagnosis present

## 2017-10-07 HISTORY — DX: Chronic kidney disease, unspecified: N18.9

## 2017-10-07 LAB — COMPREHENSIVE METABOLIC PANEL
ALT: 15 U/L (ref 14–54)
ANION GAP: 12 (ref 5–15)
AST: 33 U/L (ref 15–41)
Albumin: 3.4 g/dL — ABNORMAL LOW (ref 3.5–5.0)
Alkaline Phosphatase: 120 U/L (ref 38–126)
BUN: 50 mg/dL — ABNORMAL HIGH (ref 6–20)
CHLORIDE: 105 mmol/L (ref 101–111)
CO2: 17 mmol/L — ABNORMAL LOW (ref 22–32)
Calcium: 8.8 mg/dL — ABNORMAL LOW (ref 8.9–10.3)
Creatinine, Ser: 1.92 mg/dL — ABNORMAL HIGH (ref 0.44–1.00)
GFR, EST AFRICAN AMERICAN: 25 mL/min — AB (ref >60)
GFR, EST NON AFRICAN AMERICAN: 21 mL/min — AB (ref >60)
Glucose, Bld: 111 mg/dL — ABNORMAL HIGH (ref 65–99)
POTASSIUM: 5.1 mmol/L (ref 3.5–5.1)
Sodium: 134 mmol/L — ABNORMAL LOW (ref 135–145)
Total Bilirubin: 0.7 mg/dL (ref 0.3–1.2)
Total Protein: 7.2 g/dL (ref 6.5–8.1)

## 2017-10-07 LAB — CBC
HEMATOCRIT: 38.9 % (ref 35.0–47.0)
HEMOGLOBIN: 12.4 g/dL (ref 12.0–16.0)
MCH: 25.5 pg — ABNORMAL LOW (ref 26.0–34.0)
MCHC: 31.8 g/dL — ABNORMAL LOW (ref 32.0–36.0)
MCV: 80.4 fL (ref 80.0–100.0)
Platelets: 249 10*3/uL (ref 150–440)
RBC: 4.84 MIL/uL (ref 3.80–5.20)
RDW: 16.1 % — ABNORMAL HIGH (ref 11.5–14.5)
WBC: 12.5 10*3/uL — AB (ref 3.6–11.0)

## 2017-10-07 LAB — TROPONIN I: Troponin I: <0.03 ng/mL (ref <0.03)

## 2017-10-07 LAB — PROTIME-INR
INR: 0.99
Prothrombin Time: 13 seconds (ref 11.4–15.2)

## 2017-10-07 MED ORDER — ACETAMINOPHEN 650 MG RE SUPP: 650.0000 mg | RECTAL | Status: DC | PRN

## 2017-10-07 MED ORDER — ONDANSETRON 4 MG PO TBDP
8.0000 mg | ORAL_TABLET | Freq: Once | ORAL | Status: AC
  Administered 2017-10-07: 8 mg via ORAL
  Filled 2017-10-07: qty 2

## 2017-10-07 MED ORDER — METOPROLOL TARTRATE 5 MG/5ML IV SOLN
2.5000 mg | Freq: Once | INTRAVENOUS | Status: AC
  Administered 2017-10-07: 2.5 mg via INTRAVENOUS
  Filled 2017-10-07: qty 5

## 2017-10-07 MED ORDER — ONDANSETRON 4 MG PO TBDP
ORAL_TABLET | ORAL | Status: AC
  Filled 2017-10-07: qty 1

## 2017-10-07 MED ORDER — STROKE: EARLY STAGES OF RECOVERY BOOK
Freq: Once | Status: AC
  Administered 2017-10-07

## 2017-10-07 MED ORDER — SODIUM CHLORIDE 0.9 % IV SOLN
100.0000 mg | Freq: Two times a day (BID) | INTRAVENOUS | Status: DC
  Administered 2017-10-07 – 2017-10-10 (×6): 100 mg via INTRAVENOUS
  Filled 2017-10-07 (×7): qty 100

## 2017-10-07 MED ORDER — CLONIDINE ORAL SUSPENSION 10 MCG/ML
0.2000 mg | Freq: Once | ORAL | Status: DC
  Filled 2017-10-07: qty 20

## 2017-10-07 MED ORDER — SODIUM CHLORIDE 0.9 % IV BOLUS (SEPSIS)
500.0000 mL | Freq: Once | INTRAVENOUS | Status: AC
  Administered 2017-10-07: 500 mL via INTRAVENOUS

## 2017-10-07 MED ORDER — ACETAMINOPHEN 160 MG/5ML PO SOLN: 650.0000 mg | ORAL | Status: DC | PRN

## 2017-10-07 MED ORDER — ACETAMINOPHEN 325 MG PO TABS: 650.0000 mg | ORAL_TABLET | ORAL | Status: DC | PRN

## 2017-10-07 MED ORDER — HEPARIN SODIUM (PORCINE) 5000 UNIT/ML IJ SOLN
5000.0000 [IU] | Freq: Three times a day (TID) | INTRAMUSCULAR | Status: DC
  Administered 2017-10-08 – 2017-10-09 (×4): 5000 [IU] via SUBCUTANEOUS
  Filled 2017-10-07 (×4): qty 1

## 2017-10-07 NOTE — ED Notes (Addendum)
Per caregivers pt has been unable to swallow and keep down anything PO, " everything comes back up almost immediatly" since this am  . Pt denies any oral swelling or SOB. Speech clear. 100% on RA, RR even and unlabored

## 2017-10-07 NOTE — ED Notes (Signed)
Patient transported to CT 

## 2017-10-07 NOTE — H&P (Addendum)
[facility] [employer] [facility]   PATIENT NAME: [name]    MR#:  [id number]  DATE OF BIRTH:  22-Dec-1920  DATE OF ADMISSION:  20-Oct-2017  PRIMARY CARE PHYSICIAN: Jaclyn Shaggy, MD   REQUESTING/REFERRING PHYSICIAN: Alphonzo Lemmings, MD  CHIEF COMPLAINT:   Chief Complaint  Patient presents with  . Emesis    HISTORY OF PRESENT ILLNESS:  [name]  is a 84 y.o. female who presents with decreased appetite and difficulty swallowing.  [name] was brought in by family members who state that over the last day or 2 [name] has been less active and has had some difficulty swallowing.  Here in the ED [name] was found on CT imaging to have a significant right-sided stroke.  Patient is unable to contribute significant information to her HPI, although [name] is able to follow most simple commands.  Hospitalist were called for admission  PAST MEDICAL HISTORY:   Past Medical History:  Diagnosis Date  . CKD (chronic kidney disease)   . GERD (gastroesophageal reflux disease)   . Gout   . Hypertension   . Osteoarthritis   . Renal disorder      PAST SURGICAL HISTORY:   Past Surgical History:  Procedure Laterality Date  . LOWER EXTREMITY ANGIOGRAPHY Right 08/21/2017   Procedure: LOWER EXTREMITY ANGIOGRAPHY;  Surgeon: Renford Dills, MD;  Location: [facility] INVASIVE CV LAB;  Service: Cardiovascular;  Laterality: Right;  . LOWER EXTREMITY ANGIOGRAPHY Right 09/25/2017   Procedure: LOWER EXTREMITY ANGIOGRAPHY;  Surgeon: Renford Dills, MD;  Location: [facility] INVASIVE CV LAB;  Service: Cardiovascular;  Laterality: Right;  . NO PAST SURGERIES       SOCIAL HISTORY:   Social History   Tobacco Use  . Smoking status: Never Smoker  . Smokeless tobacco: Never Used  Substance Use Topics  . Alcohol use: No     FAMILY HISTORY:   Family History  Problem Relation Age of Onset  . Hypertension Other   . Hyperlipidemia Other      DRUG ALLERGIES:  No Known Allergies  MEDICATIONS  AT HOME:   Prior to Admission medications   Medication Sig Start Date End Date Taking? Authorizing Provider  allopurinol (ZYLOPRIM) 100 MG tablet Take 100 mg by mouth daily. (0800)   Yes [provider]  aspirin 81 MG chewable tablet Chew 81 mg by mouth daily. (0800)   Yes [provider]  cloNIDine (CATAPRES) 0.2 MG tablet Take 0.2 mg by mouth 3 (three) times daily. (0800, 1400, & 2000)   Yes [provider]  clopidogrel (PLAVIX) 75 MG tablet Take 1 tablet (75 mg total) by mouth daily. 09/25/17  Yes Schnier, Latina Craver, MD  donepezil (ARICEPT) 5 MG tablet Take 5 mg by mouth every evening. (2000)   Yes [provider]  doxycycline (VIBRAMYCIN) 100 MG capsule Take 100 mg by mouth 2 (two) times daily. For 28 days 09/22/16 10/19/17 Yes [provider]  felodipine (PLENDIL) 10 MG 24 hr tablet Take 10 mg by mouth daily. (0800)   Yes [provider]  metoprolol (LOPRESSOR) 100 MG tablet Take 100 mg by mouth 2 (two) times daily. (0800 & 2000)   Yes [provider]  QUEtiapine (SEROQUEL) 25 MG tablet Take 25 mg by mouth 2 (two) times daily. (0800 & 2000)   Yes [provider]    REVIEW OF SYSTEMS:  Review of Systems  Unable to perform ROS: Dementia     VITAL SIGNS:   Vitals:  10/13/2017 1930 10/13/2017 2032 09/14/2017 2039 10/04/2017 2045  BP: (!) 205/62 (!) 218/65 (!) 196/57 (!) 205/71  Pulse: (!) 57 64 (!) 59 (!) 58  Resp: 15 19 15 12   Temp:      TempSrc:      SpO2: 100% 100% 100% 99%  Weight:      Height:       Wt Readings from Last 3 Encounters:  09/30/2017 46.7 kg (103 lb)  09/25/17 46.7 kg (103 lb)  09/17/17 46.7 kg (103 lb)    PHYSICAL EXAMINATION:  Physical Exam  Vitals reviewed. Constitutional: [name] appears well-developed and well-nourished. No distress.  HENT:  Head: Normocephalic and atraumatic.  Mouth/Throat: Oropharynx is clear and moist.  Eyes: Pupils are equal, round, and reactive to light. Conjunctivae and  EOM are normal. No scleral icterus.  Neck: Normal range of motion. Neck supple. No JVD present. No thyromegaly present.  Cardiovascular: Normal rate, regular rhythm and intact distal pulses. Exam reveals no gallop and no friction rub.  No murmur heard. Respiratory: Effort normal and breath sounds normal. No respiratory distress. [name] has no wheezes. [name] has no rales.  GI: Soft. Bowel sounds are normal. [name] exhibits no distension. There is no tenderness.  Musculoskeletal: Normal range of motion. [name] exhibits no edema.  No arthritis, no gout  Lymphadenopathy:    [name] has no cervical adenopathy.  Neurological: [name] is alert. No cranial nerve deficit.  Neurologic: Cranial nerves II-XII intact, Sensation intact to light touch/pinprick, 4/5 strength in all extremities which family states is likely close to her baseline, no detectable dysarthria, no clear aphasia, + dysphagia, no pronator drift  Skin: Skin is warm and dry. No rash noted. No erythema.  Psychiatric:  Unable to fully assess due to patient condition    LABORATORY PANEL:   CBC Recent Labs  Lab 10/04/2017 1723  WBC 12.5*  HGB 12.4  HCT 38.9  PLT 249   ------------------------------------------------------------------------------------------------------------------  Chemistries  Recent Labs  Lab 10/05/2017 1723  NA 134*  K 5.1  CL 105  CO2 17*  GLUCOSE 111*  BUN 50*  CREATININE 1.92*  CALCIUM 8.8*  AST 33  ALT 15  ALKPHOS 120  BILITOT 0.7   ------------------------------------------------------------------------------------------------------------------  Cardiac Enzymes Recent Labs  Lab 09/30/2017 2023  TROPONINI <0.03   ------------------------------------------------------------------------------------------------------------------  RADIOLOGY:  Dg Chest 2 View  Result Date: 10/01/2017 CLINICAL DATA:  84 year old female with vomiting and diarrhea onset today. EXAM: CHEST - 2 VIEW COMPARISON:  Cervical spine CT  03/10/2010. FINDINGS: Seated AP and lateral views of the chest. Lung volumes are at the upper limits of normal to hyperinflated. Chronic calcified biapical pleural scarring redemonstrated. Mild cardiomegaly. Other mediastinal contours are within normal limits. Visualized tracheal air column is within normal limits. No pneumothorax, pulmonary edema, pleural effusion or confluent pulmonary opacity. Osteopenia. No acute osseous abnormality identified. Negative visible bowel gas pattern. IMPRESSION: No acute cardiopulmonary abnormality. Electronically Signed   By: Odessa FlemingH  Hall M.D.   On: 09/25/2017 20:44   Ct Head Wo Contrast  Result Date: 10/02/2017 CLINICAL DATA:  84 y/o  F; unable to swallow. EXAM: CT HEAD WITHOUT CONTRAST TECHNIQUE: Contiguous axial images were obtained from the base of the skull through the vertex without intravenous contrast. COMPARISON:  05/04/2017 CT head. FINDINGS: Brain: Hypoattenuation with loss of gray-white differentiation involving the right perisylvian temporal lobe, insula, and extending into right parietal lobe compatible with acute or subacute infarction, ASPECTS is 6. No hemorrhage or significant mass effect. Stable background of chronic  microvascular ischemic changes and parenchymal volume loss of the brain. No extra-axial collection, hydrocephalus, or effacement of basilar cisterns. Vascular: Calcific atherosclerosis of the carotid siphons. Skull: Normal. Negative for fracture or focal lesion. Sinuses/Orbits: No acute finding. Other: None. IMPRESSION: 1. Right MCA distribution acute or subacute infarction involving right temporal lobe, insula, and right parietal lobe. No hemorrhage or significant mass effect. ASPECTS is 6. 2. Stable background of chronic microvascular ischemic changes and parenchymal volume loss of the brain. These results were called by telephone at the time of interpretation on 10/27/17 at 8:37 pm to Dr. Ileana Roup , who verbally acknowledged these results.  Electronically Signed   By: Mitzi Hansen M.D.   On: 2017/10/27 20:40   Ct Soft Tissue Neck Wo Contrast  Result Date: October 27, 2017 CLINICAL DATA:  84 y/o  F; unable to swallow. EXAM: CT NECK WITHOUT CONTRAST TECHNIQUE: Multidetector CT imaging of the neck was performed following the standard protocol without intravenous contrast. COMPARISON:  03/10/2010 CT of cervical spine. FINDINGS: Pharynx and larynx: Normal. No mass or swelling. Salivary glands: No inflammation, mass, or stone. Thyroid: 7 mm nodule in the right lobe of the thyroid gland. Lymph nodes: None enlarged or abnormal density. Vascular: Calcific atherosclerosis of aortic arch and carotid siphons. Retropharyngeal submucosal course of the left carotid system. Limited intracranial: Negative. Visualized orbits: Bilateral intra-ocular lens replacement. Mastoids and visualized paranasal sinuses: Clear. Skeleton: C4-5, C5-6, and C6-7 grade 1 anterolisthesis. Moderate cervical spondylosis with discogenic degenerative changes greatest at C5-6 and upper cervical facet hypertrophy. No high-grade bony canal stenosis. Upper chest: Calcified pleuroparenchymal scarring of the lung apices. Other: None. IMPRESSION: 1. No mass or inflammatory process of the aerodigestive tract identified. 2. Moderate spondylosis of the cervical spine. 3. Calcific atherosclerosis of carotid siphons and aorta. Electronically Signed   By: Mitzi Hansen M.D.   On: 10/27/2017 20:33    EKG:   Orders placed or performed during the hospital encounter of 10/27/2017  . ED EKG  . ED EKG  . EKG 12-Lead  . EKG 12-Lead    IMPRESSION AND PLAN:  Principal Problem:   Stroke [facility]) -admit per stroke admission order set with MRI imaging, ultrasound carotids, neurology consult, and other labs and therapy evaluations Active Problems:   PAD (peripheral artery disease) (HCC) -deferred to neurology's recommendations for when to restart antiplatelet therapies   Essential  hypertension -permissive hypertension tonight, blood pressure goal less than 220/120, can restart blood pressure medications tomorrow morning as this will be 24 hours after last known normal   Osteomyelitis of right foot (HCC) -continue doxycycline   GERD (gastroesophageal reflux disease) -home dose PPI   CKD (chronic kidney disease), stage IV (HCC) -at or near baseline, avoid nephrotoxins and monitor  Chart review performed and case discussed with ED provider. Labs, imaging and/or ECG reviewed by provider and discussed with patient/family. Management plans discussed with the patient and/or family.  DVT PROPHYLAXIS: SubQ heparin  GI PROPHYLAXIS: PPI  ADMISSION STATUS: Inpatient  CODE STATUS: DNR -ordered previously by Dr. Gilda Crease.  Initially when family was asked about this they confirmed DNR status.  However, afterwards they approached ED nurse and specified that the patient had expressed wishes in the past to "die a natural death."  It is unclear if they fully understood the DNR order.  We have ordered DNR here as it was previously ordered in our system, but this conversation should likely be clarified with family in the morning. Code Status History    Date Active  Date Inactive Code Status Order ID Comments User Context   09/25/2017 1200 09/25/2017 1712 DNR [id number]  Renford Dills, MD Inpatient   08/21/2017 1337 08/21/2017 1819 Full Code [id number]  Schnier, Latina Craver, MD Inpatient    Questions for Most Recent Historical Code Status (Order [id number])    Question Answer Comment   In the event of cardiac or respiratory ARREST Do not call a "code blue"    In the event of cardiac or respiratory ARREST Do not perform Intubation, CPR, defibrillation or ACLS    In the event of cardiac or respiratory ARREST Use medication by any route, position, wound care, and other measures to relive pain and suffering. May use oxygen, suction and manual treatment of airway obstruction as needed for comfort.        TOTAL TIME TAKING CARE OF THIS PATIENT: 45 minutes.   [name] 2017-10-17, 9:38 PM  [facility]  Office  [phone]  CC: Primary care physician; Jaclyn Shaggy, MD  Note:  This document was prepared using Dragon voice recognition software and may include unintentional dictation errors.

## 2017-10-07 NOTE — ED Triage Notes (Signed)
[facility] c/o unable to keep food down. Facility staff member states pt had blood present on tissue after vomiting. Reports pt ate earlier without incident. No emesis noted in triage.

## 2017-10-07 NOTE — ED Notes (Signed)
Admitting MD at bedside.

## 2017-10-07 NOTE — ED Notes (Signed)
Unable to draw large amount of blood for light green tube. Sent to lab for analysis. Will recollect if notified by lab of need to.

## 2017-10-07 NOTE — ED Notes (Signed)
MD aware BP

## 2017-10-07 NOTE — ED Provider Notes (Addendum)
[facility] Emergency Department Provider Note  ____________________________________________   I have reviewed the triage vital signs and the nursing notes. Where available I have reviewed prior notes and, if possible and indicated, outside hospital notes.    HISTORY  Chief Complaint Emesis    HPI [name] FleetKatie L Hirst is a [age] y.o. female he does have a long history of peripheral vascular disease but here today her complaint is that she had some mild emesis during the day today.  She has at baseline difficulty swallowing has to put her neck back to swallow and this is her normal baseline for many years.  Today, when they were feeding her liquid and food this morning, she was able to eat breakfast but then had some vomiting.  She also had a couple loose stools.  She has had no fever chills no abdominal pain she is acting completely at her baseline and all other respects.  She has no complaints of odynophagia or throat pain.   Level 5 chart caveat; no further history available due to patient status.   Past Medical History:  Diagnosis Date  . GERD (gastroesophageal reflux disease)   . Gout   . Hypertension   . Osteoarthritis   . Renal disorder     Patient Active Problem List   Diagnosis Date Noted  . Osteomyelitis of right foot (HCC) 09/17/2017  . PAD (peripheral artery disease) (HCC) 08/08/2017  . 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 Essential hypertension 08/08/2017    Past Surgical History:  Procedure Laterality Date  . LOWER EXTREMITY ANGIOGRAPHY Right 08/21/2017   Procedure: LOWER EXTREMITY ANGIOGRAPHY;  Surgeon: Renford DillsSchnier, Gregory G, MD;  Location: [facility] INVASIVE CV LAB;  Service: Cardiovascular;  Laterality: Right;  . LOWER EXTREMITY ANGIOGRAPHY Right 09/25/2017   Procedure: LOWER EXTREMITY ANGIOGRAPHY;  Surgeon: Renford DillsSchnier, Gregory G, MD;  Location: [facility] INVASIVE CV LAB;  Service: Cardiovascular;  Laterality: Right;  . NO  PAST SURGERIES      Prior to Admission medications   Medication Sig Start Date End Date Taking? Authorizing Provider  allopurinol (ZYLOPRIM) 100 MG tablet Take 100 mg by mouth daily. (0800)   Yes [provider]  aspirin 81 MG chewable tablet Chew 81 mg by mouth daily. (0800)   Yes [provider]  cloNIDine (CATAPRES) 0.2 MG tablet Take 0.2 mg by mouth 3 (three) times daily. (0800, 1400, & 2000)   Yes [provider]  clopidogrel (PLAVIX) 75 MG tablet Take 1 tablet (75 mg total) by mouth daily. 09/25/17  Yes Schnier, Latina CraverGregory G, MD  donepezil (ARICEPT) 5 MG tablet Take 5 mg by mouth every evening. (2000)   Yes [provider]  doxycycline (VIBRAMYCIN) 100 MG capsule Take 100 mg by mouth 2 (two) times daily. For 28 days 09/22/16 10/19/17 Yes [provider]  felodipine (PLENDIL) 10 MG 24 hr tablet Take 10 mg by mouth daily. (0800)   Yes [provider]  metoprolol (LOPRESSOR) 100 MG tablet Take 100 mg by mouth 2 (two) times daily. (0800 & 2000)   Yes [provider]  QUEtiapine (SEROQUEL) 25 MG tablet Take 25 mg by mouth 2 (two) times daily. (0800 & 2000)   Yes [provider]    Allergies Patient has no known allergies.  Family History  Problem Relation Age of Onset  . Hypertension Other   . Hyperlipidemia Other     Social History Social History   Tobacco Use  .  Smoking status: Never Smoker  . Smokeless tobacco: Never Used  Substance Use Topics  . Alcohol use: No  . Drug use: No    Review of Systems Constitutional: No fever/chills Eyes: No visual changes. ENT: No sore throat. No stiff neck no neck pain Cardiovascular: Denies chest pain. Respiratory: Denies shortness of breath. Gastrointestinal: See HPI Genitourinary: Negative for dysuria. Musculoskeletal: Negative lower extremity swelling Skin: Negative for rash. Neurological: Negative for severe headaches, focal weakness or  numbness.   ____________________________________________   PHYSICAL EXAM:  VITAL SIGNS: ED Triage Vitals  Enc Vitals Group     BP 10-19-17 1716 (!) 183/64     Pulse Rate 19-Oct-2017 1716 (!) 56     Resp 2017-10-19 1814 15     Temp Oct 19, 2017 1716 98.2 F (36.8 C)     Temp Source October 19, 2017 1716 Oral     SpO2 10/19/2017 1716 98 %     Weight 10-19-17 1716 103 lb (46.7 kg)     Height 10/19/2017 1716 4\' 8"  (1.422 m)     Head Circumference --      Peak Flow --      Pain Score October 19, 2017 1722 0     Pain Loc --      Pain Edu? --      Excl. in GC? --     Constitutional: Alert and oriented at baseline. Well appearing and in no acute distress. Eyes: Conjunctivae are normal Head: Atraumatic HEENT: No congestion/rhinnorhea. Mucous membranes are moist.  Oropharynx non-erythematous, I did give the patient ginger on she is able to swallow without difficulty Neck:   Nontender with no meningismus, no masses, no stridor Cardiovascular: Normal rate, regular rhythm. Grossly normal heart sounds.  Good peripheral circulation. Respiratory: Normal respiratory effort.  No retractions. Lungs CTAB. Abdominal: Soft and nontender. No distention. No guarding no rebound Back:  There is no focal tenderness or step off.  there is no midline tenderness there are no lesions noted. there is no CVA tenderness Musculoskeletal: No lower extremity tenderness, no upper extremity tenderness. No joint effusions, no DVT signs strong distal pulses no edema Neurologic:  Normal speech and language. No gross focal neurologic deficits are appreciated.  Skin:  Skin is warm, dry and intact. No rash noted. Psychiatric: Mood and affect are normal. Speech and behavior are normal.  ____________________________________________   LABS (all labs ordered are listed, but only abnormal results are displayed)  Labs Reviewed  COMPREHENSIVE METABOLIC PANEL - Abnormal; Notable for the following components:      Result Value   Sodium 134 (*)     CO2 17 (*)    Glucose, Bld 111 (*)    BUN 50 (*)    Creatinine, Ser 1.92 (*)    Calcium 8.8 (*)    Albumin 3.4 (*)    GFR calc non Af Amer 21 (*)    GFR calc Af Amer 25 (*)    All other components within normal limits  CBC - Abnormal; Notable for the following components:   WBC 12.5 (*)    MCH 25.5 (*)    MCHC 31.8 (*)    RDW 16.1 (*)    All other components within normal limits    Pertinent labs  results that were available during my care of the patient were reviewed by me and considered in my medical decision making (see chart for details). ____________________________________________  EKG  I personally interpreted any EKGs ordered by me or triage Rate 56 bpm, no acute ST elevation  or depression, baseline artifact limits interpretation, can be sinus bradycardia or high junctional ____________________________________________  RADIOLOGY  Pertinent labs & imaging results that were available during my care of the patient were reviewed by me and considered in my medical decision making (see chart for details). If possible, patient and/or family made aware of any abnormal findings.  No results found. ____________________________________________    PROCEDURES  Procedure(s) performed: None  Procedures  Critical Care performed: None  ____________________________________________   INITIAL IMPRESSION / ASSESSMENT AND PLAN / ED COURSE  Pertinent labs & imaging results that were available during my care of the patient were reviewed by me and considered in my medical decision making (see chart for details).  Patient is in no acute distress, she is able to drink at baseline, she has no abdominal pain or tenderness, vital signs show hypertension which is not unusual for the patient this is consistent with all prior visits that I can easily see.  Family feels that she is at her baseline we will give her a small bolus of IV fluid to make sure she did not become dehydrated today we  will have her try p.o. and will go from there.  At this time there is no evidence of CVA she is neurologically intact with a NIH stroke scale of 0, nor is there any evidence of obstructing foreign body or mass that I detect on exam  ----------------------------------------- 7:31 PM on 10/13/2017 -----------------------------------------  I did give the patient a pill which she was unable to swallow and gag on.  She did not appear to aspirate.  I will obtain a CT of the head to make sure that there is no other organic cause for this dysphagia although it seems like it possibly is associated with her baseline problem here we are giving her IV fluid because he is certainly dehydrated based on her creatinine compared to a week ago, we will also do a CT soft tissue of the neck without contrast to see if there is an obstructing mass.  EKG will be obtained and chest x-ray as a precaution as well.  ----------------------------------------- 8:18 PM on 09/26/2017 -----------------------------------------  Patient's blood pressure is high, she takes clonidine twice daily, she has not been able to take it because she is having some swallowing difficulties, posterior pharynx total looks normal to me, CT scan is pending, I will give her oral clonidine and a small dose of IV labetalol she also takes 100 mg of metoprolol daily, she is asymptomatic with these pressures and I think is likely secondary to not taking her medications but we will see if we can get her in some blood pressure medication while we are waiting for CT results.   ----------------------------------------- 8:39 PM on 10/01/2017 ----------------------------------------- ' CT scan shows an acute stroke, patient did get a little Lopressor, but we did hold on the clonidine.  After discussion with the hospitalist, they prefer permissive hypercapnia in the first 24 hours.  Patient is not a candidate for TPA or acute intervention, she probably is  almost 24 hours out from this given history.  She will be admitted to the hospital for acute renal injury probably from inability to tolerate p.o. well, and acute CVA    ____________________________________________   FINAL CLINICAL IMPRESSION(S) / ED DIAGNOSES  Final diagnoses:  None      This chart was dictated using voice recognition software.  Despite best efforts to proofread,  errors can occur which can change meaning.  Jeanmarie Plant, MD Oct 11, 2017 Margretta Ditty  Jeanmarie Plant, MD 10/11/2017 1931  Jeanmarie Plant, MD Oct 11, 2017 Corky Crafts  Jeanmarie Plant, MD 10/11/17 2019  Jeanmarie Plant, MD 10-11-17 2022  Jeanmarie Plant, MD Oct 11, 2017 2051  Jeanmarie Plant, MD 10/11/2017 2052

## 2017-10-07 NOTE — ED Notes (Addendum)
Pt ambulatory to toilet in room without difficulty, with moderately steady gait. This RN assist. Pt then placed back in stretcher, placed back on monitor and warm blankets provided. Family remains at bedside.

## 2017-10-08 ENCOUNTER — Inpatient Hospital Stay (HOSPITAL_COMMUNITY)
Admit: 2017-10-08 | Discharge: 2017-10-08 | Disposition: A | Discharge status: Home / Self Care | Payer: Medicare Other | Attending: Internal Medicine | Admitting: Internal Medicine

## 2017-10-08 ENCOUNTER — Inpatient Hospital Stay: Payer: Medicare Other

## 2017-10-08 DIAGNOSIS — I639 Cerebral infarction, unspecified: Secondary | ICD-10-CM

## 2017-10-08 DIAGNOSIS — I351 Nonrheumatic aortic (valve) insufficiency: Secondary | ICD-10-CM

## 2017-10-08 LAB — BASIC METABOLIC PANEL
ANION GAP: 10 (ref 5–15)
BUN: 45 mg/dL — ABNORMAL HIGH (ref 6–20)
CALCIUM: 8.4 mg/dL — AB (ref 8.9–10.3)
CHLORIDE: 108 mmol/L (ref 101–111)
CO2: 20 mmol/L — ABNORMAL LOW (ref 22–32)
Creatinine, Ser: 1.94 mg/dL — ABNORMAL HIGH (ref 0.44–1.00)
GFR calc non Af Amer: 21 mL/min — ABNORMAL LOW (ref >60)
GFR, EST AFRICAN AMERICAN: 24 mL/min — AB (ref >60)
Glucose, Bld: 89 mg/dL (ref 65–99)
Potassium: 4.5 mmol/L (ref 3.5–5.1)
Sodium: 138 mmol/L (ref 135–145)

## 2017-10-08 LAB — LIPID PANEL
CHOL/HDL RATIO: 1.8 ratio
CHOLESTEROL: 84 mg/dL (ref 0–200)
HDL: 47 mg/dL (ref >40)
LDL Cholesterol: 28 mg/dL (ref 0–99)
TRIGLYCERIDES: 46 mg/dL (ref <150)
VLDL: 9 mg/dL (ref 0–40)

## 2017-10-08 LAB — MRSA PCR SCREENING: MRSA by PCR: NEGATIVE

## 2017-10-08 LAB — HEMOGLOBIN A1C
Hgb A1c MFr Bld: 5.5 % (ref 4.8–5.6)
Mean Plasma Glucose: 111.15 mg/dL

## 2017-10-08 LAB — ECHOCARDIOGRAM COMPLETE
Height: 56 in
Weight: 1648 oz

## 2017-10-08 MED ORDER — SODIUM CHLORIDE 0.9 % IV SOLN
INTRAVENOUS | Status: DC
  Administered 2017-10-08 – 2017-10-09 (×2): via INTRAVENOUS

## 2017-10-08 MED ORDER — LABETALOL HCL 5 MG/ML IV SOLN
10.0000 mg | INTRAVENOUS | Status: DC | PRN
  Administered 2017-10-08: 22:00:00 10 mg via INTRAVENOUS
  Filled 2017-10-08: qty 4

## 2017-10-08 MED ORDER — HYDRALAZINE HCL 20 MG/ML IJ SOLN
10.0000 mg | Freq: Once | INTRAMUSCULAR | Status: AC
  Administered 2017-10-08: 12:00:00 10 mg via INTRAVENOUS
  Filled 2017-10-08: qty 1

## 2017-10-08 MED ORDER — ASPIRIN 300 MG RE SUPP
300.0000 mg | Freq: Every day | RECTAL | Status: DC
  Administered 2017-10-08: 300 mg via RECTAL
  Filled 2017-10-08 (×2): qty 1

## 2017-10-08 NOTE — Progress Notes (Signed)
Initial Nutrition Assessment  DOCUMENTATION CODES:   Severe malnutrition in context of chronic illness  INTERVENTION:  Once diet able to be advanced provide Ensure Enlive po BID, each supplement provides 350 kcal and 20 grams of protein. Patient prefers vanilla.  Once diet able to be advanced provide Magic cup TID with meals, each supplement provides 290 kcal and 9 grams of protein.   Also recommend daily MVI.  Provide 1:1 assistance with meals.  NUTRITION DIAGNOSIS:   Severe Malnutrition related to chronic illness(advanced age, decreasing appetite and intake, CKD) as evidenced by severe fat depletion, severe muscle depletion.  GOAL:   Patient will meet greater than or equal to 90% of their needs  MONITOR:   PO intake, Supplement acceptance, Diet advancement, Labs, Weight trends, Skin, I & O's  REASON FOR ASSESSMENT:   Malnutrition Screening Tool    ASSESSMENT:   84 year old female with PMHx of HTN, gout, GERD, OA, CKD stage III who is admitted from [facility] group home with poor appetite, difficulty swallowing, and lethargy found to have acute MCA CVA, acute on chronic renal failure.   -Following SLP evaluation today patient is being placed on clear liquid diet for now with strict aspiration precautions. Plan is to reassess tomorrow with other consistencies.  Met with patient, her husband, and her caregiver at bedside. Most of history provided by caregiver. Caregiver reports that patient eats very little at meals. She is served 3 meals per day. Patient is on a "regular diet" but really all of the foods they have at the facility are softer. Patient is missing all of her upper teeth and only has a few teeth on the bottom. She occasionally "chokes" on her foods and needs supervision at all meals. She can usually feed herself but caregiver reports she is much weaker now. She eats a bedtime snacks that is usually wafers or a soft popcorn that does not have any hulls. She has  also been started on a protein shake lately with the protein powder from [employer]. She enjoys ice cream as well.  Caregiver reports patient was recently 100-103 lbs but she has been losing weight. Per chart she was 101.6 lbs on 08/08/2017. RD obtained bed scale weight of 87 lbs on low bed. She has lost 14.6 lbs (14.4% body weight) over the past 2 months, which is significant for time frame.  Medications reviewed and include: aspirin suppository, heparin, NS @ 50 mL/hr, doxycycline.  Labs reviewed: CO2 20, BUN 45, Creatinine 1.94. HgbA1c 5.5.  Discussed with RN.  NUTRITION - FOCUSED PHYSICAL EXAM:    Most Recent Value  Orbital Region  Severe depletion  Upper Arm Region  Severe depletion  Thoracic and Lumbar Region  Severe depletion  Buccal Region  Severe depletion  Temple Region  Severe depletion  Clavicle Bone Region  Severe depletion  Clavicle and Acromion Bone Region  Severe depletion  Scapular Bone Region  Severe depletion  Dorsal Hand  Severe depletion  Patellar Region  Severe depletion  Anterior Thigh Region  Severe depletion  Posterior Calf Region  Severe depletion  Edema (RD Assessment)  Mild [lower extremities]  Hair  Reviewed  Eyes  Reviewed  Mouth  Reviewed [missing dentition on bottom,  no dentition on top]  Skin  Reviewed  Nails  Reviewed     Diet Order:  Fall precautions Diet clear liquid Room service appropriate? Yes; Fluid consistency: Thin  EDUCATION NEEDS:   No education needs have been identified at this time  Skin:  Skin Assessment: Skin Integrity Issues:(non-pressure wound second toe on right foot)  Last BM:  10/08/2017 - medium type 3  Height:   Ht Readings from Last 1 Encounters:  09/20/2017 4' 8"  (1.422 m)    Weight:   Wt Readings from Last 1 Encounters:  10/08/17 87 lb (39.5 kg)    Ideal Body Weight:  42.4 kg  BMI:  Body mass index is 19.51 kg/m.  Estimated Nutritional Needs:   Kcal:  1200-1400 (30-35 kcal/kg)  Protein:  60-70 grams  (1.5-1.7 grams/kg; 20% of kcal needs)  Fluid:  1.2-1.4 L/day (1 mL/kcal)  Willey Blade, MS, RD, LDN Office: [phone] Pager: [phone] After Hours/Weekend Pager: [phone]

## 2017-10-08 NOTE — Progress Notes (Signed)
OT Cancellation Note  Patient Details Name: [name] FleetKatie L [name] MRN: [id number] DOB: 20-Nov-1920   Cancelled Treatment:    Reason Eval/Treat Not Completed: Patient at procedure or test/ unavailable. Order received, chart reviewed. Pt out of room for diagnostic testing. Will re-attempt OT evaluation at later date/time as pt is available and medically appropriate.  Richrd PrimeJamie Stiller, MPH, MS, OTR/L ascom [phone] 10/08/17, 9:01 AM

## 2017-10-08 NOTE — Consult Note (Addendum)
Referring Physician: Cherlynn KaiserSainani    Chief Complaint: Difficulty swallowing  HPI: [name] is an 84 y.o. female with a history of HTN who was brought in by family due to decreased appetite and difficulty swallowing.  For about 2 days prior to presentation the patient has been less active and had difficulty swallowing.  Patient unable to give much history due to confusion and family not available at this time therefore all history obtained from the chart.    Initial NIHSS of 2.   Date last known well: Unable to determine Time last known well: Unable to determine tPA Given: No: Unable to determine LKW, outside time window  Past Medical History:  Diagnosis Date  . CKD (chronic kidney disease)   . GERD (gastroesophageal reflux disease)   . Gout   . Hypertension   . Osteoarthritis   . Renal disorder     Past Surgical History:  Procedure Laterality Date  . LOWER EXTREMITY ANGIOGRAPHY Right 08/21/2017   Procedure: LOWER EXTREMITY ANGIOGRAPHY;  Surgeon: Renford DillsSchnier, Gregory G, MD;  Location: [facility] INVASIVE CV LAB;  Service: Cardiovascular;  Laterality: Right;  . LOWER EXTREMITY ANGIOGRAPHY Right 09/25/2017   Procedure: LOWER EXTREMITY ANGIOGRAPHY;  Surgeon: Renford DillsSchnier, Gregory G, MD;  Location: [facility] INVASIVE CV [facility];  Service: Cardiovascular;  Laterality: Right;  . NO PAST SURGERIES      Family History  Problem Relation Age of Onset  . Hypertension Other   . Hyperlipidemia Other    Social History:  reports that she has never smoked. She has never used smokeless tobacco. She reports that she does not drink alcohol or use drugs.  Allergies: No Known Allergies  Medications:  I have reviewed the patient's current medications. Prior to Admission:  Medications Prior to Admission  Medication Sig Dispense Refill Last Dose  . allopurinol (ZYLOPRIM) 100 MG tablet Take 100 mg by mouth daily. (0800)   09/21/2017 at 0800  . aspirin 81 MG chewable tablet Chew 81 mg by mouth daily. (0800)   09/24/2017 at  0800  . cloNIDine (CATAPRES) 0.2 MG tablet Take 0.2 mg by mouth 3 (three) times daily. (0800, 1400, & 2000)   10/08/2017 at 0800  . clopidogrel (PLAVIX) 75 MG tablet Take 1 tablet (75 mg total) by mouth daily. 30 tablet 3 10/01/2017 at 0800  . donepezil (ARICEPT) 5 MG tablet Take 5 mg by mouth every evening. (2000)   10/06/2017 at 2000  . doxycycline (VIBRAMYCIN) 100 MG capsule Take 100 mg by mouth 2 (two) times daily. For 28 days   10/08/2017 at 0800  . felodipine (PLENDIL) 10 MG 24 hr tablet Take 10 mg by mouth daily. (0800)   10/04/2017 at 0800  . metoprolol (LOPRESSOR) 100 MG tablet Take 100 mg by mouth 2 (two) times daily. (0800 & 2000)   09/21/2017 at 0800  . QUEtiapine (SEROQUEL) 25 MG tablet Take 25 mg by mouth 2 (two) times daily. (0800 & 2000)   09/17/2017 at 0800   Scheduled: . heparin  5,000 Units Subcutaneous Q8H    ROS: History obtained from the patient  General ROS: negative for - chills, fatigue, fever, night sweats, weight gain or weight loss Psychological ROS: negative for - behavioral disorder, hallucinations, memory difficulties, mood swings or suicidal ideation Ophthalmic ROS: negative for - blurry vision, double vision, eye pain or loss of vision ENT ROS: negative for - epistaxis, nasal discharge, oral lesions, sore throat, tinnitus or vertigo Allergy and Immunology ROS: negative for - hives or itchy/watery eyes Hematological  and Lymphatic ROS: negative for - bleeding problems, bruising or swollen lymph nodes Endocrine ROS: negative for - galactorrhea, hair pattern changes, polydipsia/polyuria or temperature intolerance Respiratory ROS: negative for - cough, hemoptysis, shortness of breath or wheezing Cardiovascular ROS: negative for - chest pain, dyspnea on exertion, edema or irregular heartbeat Gastrointestinal ROS: negative for - abdominal pain, diarrhea, hematemesis, nausea/vomiting or stool incontinence Genito-Urinary ROS: negative for - dysuria, hematuria, incontinence  or urinary frequency/urgency Musculoskeletal ROS: negative for - joint swelling or muscular weakness Neurological ROS: as noted in HPI Dermatological ROS: negative for rash and skin lesion changes  Physical Examination: Blood pressure (!) 155/40, pulse 85, temperature 98.9 F (37.2 C), temperature source Oral, resp. rate 18, height 4\' 8"  (1.422 m), weight 46.7 kg (103 lb), SpO2 99 %.  HEENT-  Normocephalic, no lesions, without obvious abnormality.  Normal external eye and conjunctiva.  Normal TM's bilaterally.  Normal auditory canals and external ears. Normal external nose, mucus membranes and septum.  Normal pharynx. Cardiovascular- S1, S2 normal, pulses palpable throughout   Lungs- chest clear, no wheezing, rales, normal symmetric air entry Abdomen- soft, non-tender; bowel sounds normal; no masses,  no organomegaly Extremities- no edema Lymph-no adenopathy palpable Musculoskeletal-swollen right knee Skin-warm and dry, no hyperpigmentation, vitiligo, or suspicious lesions  Neurological Examination   Mental Status: Alert.  Difficulty with 3-step commands.  Confused.  Speech fluent without evidence of aphasia. Cranial Nerves: II: Discs flat bilaterally; Visual fields grossly normal, pupils equal, round, reactive to light and accommodation III,IV, VI: ptosis not present, extra-ocular motions intact bilaterally V,VII: smile symmetric, facial light touch sensation normal bilaterally VIII: hearing normal bilaterally IX,X: gag reflex present XI: bilateral shoulder shrug XII: midline tongue extension Motor: Right : Upper extremity   5/5    Left:     Upper extremity   5/5  Lower extremity   5/5     Lower extremity   5/5 Tone and bulk:normal tone throughout; no atrophy noted Sensory: Decreased sensation LLE Deep Tendon Reflexes: 2+ in the upper extremities and absent in the lower extremities Plantars: Right: mute   Left: mute Cerebellar: Unable to perform due to confusion Gait: not  tested due to safety concerns    Laboratory Studies:  Basic Metabolic Panel: Recent Labs  Lab 09/29/2017 1723 10/08/17 0403  NA 134* 138  K 5.1 4.5  CL 105 108  CO2 17* 20*  GLUCOSE 111* 89  BUN 50* 45*  CREATININE 1.92* 1.94*  CALCIUM 8.8* 8.4*    Liver Function Tests: Recent Labs  Lab 09/27/2017 1723  AST 33  ALT 15  ALKPHOS 120  BILITOT 0.7  PROT 7.2  ALBUMIN 3.4*   No results for input(s): LIPASE, AMYLASE in the last 168 hours. No results for input(s): AMMONIA in the last 168 hours.  CBC: Recent Labs  Lab 10/13/2017 1723  WBC 12.5*  HGB 12.4  HCT 38.9  MCV 80.4  PLT 249    Cardiac Enzymes: Recent Labs  Lab 10/13/2017 2023  TROPONINI <0.03    BNP: Invalid input(s): POCBNP  CBG: No results for input(s): GLUCAP in the last 168 hours.  Microbiology: Results for orders placed or performed during the hospital encounter of 10/02/2017  MRSA PCR Screening     Status: None   Collection Time: 10/01/2017 10:52 PM  Result Value Ref Range Status   MRSA by PCR NEGATIVE NEGATIVE Final    Comment:        The GeneXpert MRSA Assay (FDA approved for NASAL specimens  only), is one component of a comprehensive MRSA colonization surveillance program. It is not intended to diagnose MRSA infection nor to guide or monitor treatment for MRSA infections. Performed at [facility], [address] [place], [place] [address]     Coagulation Studies: Recent Labs    10/06/2017 12-11-39  LABPROT 13.0  INR 0.99    Urinalysis: No results for input(s): COLORURINE, LABSPEC, PHURINE, GLUCOSEU, HGBUR, BILIRUBINUR, KETONESUR, PROTEINUR, UROBILINOGEN, NITRITE, LEUKOCYTESUR in the last 168 hours.  Invalid input(s): APPERANCEUR  Lipid Panel:    Component Value Date/Time   CHOL 84 10/08/2017 0403   TRIG 46 10/08/2017 0403   HDL 47 10/08/2017 0403   CHOLHDL 1.8 10/08/2017 0403   VLDL 9 10/08/2017 0403   LDLCALC 28 10/08/2017 0403    HgbA1C:  Lab Results  Component  Value Date   HGBA1C 5.5 10/08/2017    Urine Drug Screen:  No results found for: LABOPIA, COCAINSCRNUR, LABBENZ, AMPHETMU, THCU, LABBARB  Alcohol Level: No results for input(s): ETH in the last 168 hours.  Other results: EKG: 56 bpm.  Imaging: Dg Chest 2 View  Result Date: 10/02/2017 CLINICAL DATA:  84 year old female with vomiting and diarrhea onset today. EXAM: CHEST - 2 VIEW COMPARISON:  Cervical spine CT 03/10/2010. FINDINGS: Seated AP and lateral views of the chest. Lung volumes are at the upper limits of normal to hyperinflated. Chronic calcified biapical pleural scarring redemonstrated. Mild cardiomegaly. Other mediastinal contours are within normal limits. Visualized tracheal air column is within normal limits. No pneumothorax, pulmonary edema, pleural effusion or confluent pulmonary opacity. Osteopenia. No acute osseous abnormality identified. Negative visible bowel gas pattern. IMPRESSION: No acute cardiopulmonary abnormality. Electronically Signed   By: Odessa Fleming M.D.   On: 09/29/2017 20:44   Ct Head Wo Contrast  Result Date: 09/22/2017 CLINICAL DATA:  84 y/o  F; unable to swallow. EXAM: CT HEAD WITHOUT CONTRAST TECHNIQUE: Contiguous axial images were obtained from the base of the skull through the vertex without intravenous contrast. COMPARISON:  05/04/2017 CT head. FINDINGS: Brain: Hypoattenuation with loss of gray-white differentiation involving the right perisylvian temporal lobe, insula, and extending into right parietal lobe compatible with acute or subacute infarction, ASPECTS is 6. No hemorrhage or significant mass effect. Stable background of chronic microvascular ischemic changes and parenchymal volume loss of the brain. No extra-axial collection, hydrocephalus, or effacement of basilar cisterns. Vascular: Calcific atherosclerosis of the carotid siphons. Skull: Normal. Negative for fracture or focal lesion. Sinuses/Orbits: No acute finding. Other: None. IMPRESSION: 1. Right MCA  distribution acute or subacute infarction involving right temporal lobe, insula, and right parietal lobe. No hemorrhage or significant mass effect. ASPECTS is 6. 2. Stable background of chronic microvascular ischemic changes and parenchymal volume loss of the brain. These results were called by telephone at the time of interpretation on 10/05/2017 at 8:37 pm to Dr. Ileana Roup , who verbally acknowledged these results. Electronically Signed   By: Mitzi Hansen M.D.   On: 10/05/2017 20:40   Ct Soft Tissue Neck Wo Contrast  Result Date: 09/18/2017 CLINICAL DATA:  84 y/o  F; unable to swallow. EXAM: CT NECK WITHOUT CONTRAST TECHNIQUE: Multidetector CT imaging of the neck was performed following the standard protocol without intravenous contrast. COMPARISON:  03/10/2010 CT of cervical spine. FINDINGS: Pharynx and larynx: Normal. No mass or swelling. Salivary glands: No inflammation, mass, or stone. Thyroid: 7 mm nodule in the right lobe of the thyroid gland. Lymph nodes: None enlarged or abnormal density. Vascular: Calcific atherosclerosis of  aortic arch and carotid siphons. Retropharyngeal submucosal course of the left carotid system. Limited intracranial: Negative. Visualized orbits: Bilateral intra-ocular lens replacement. Mastoids and visualized paranasal sinuses: Clear. Skeleton: C4-5, C5-6, and C6-7 grade 1 anterolisthesis. Moderate cervical spondylosis with discogenic degenerative changes greatest at C5-6 and upper cervical facet hypertrophy. No high-grade bony canal stenosis. Upper chest: Calcified pleuroparenchymal scarring of the lung apices. Other: None. IMPRESSION: 1. No mass or inflammatory process of the aerodigestive tract identified. 2. Moderate spondylosis of the cervical spine. 3. Calcific atherosclerosis of carotid siphons and aorta. Electronically Signed   By: Mitzi Hansen M.D.   On: Oct 29, 2017 20:33   Mr Brain Wo Contrast  Result Date: 10/08/2017 CLINICAL DATA:   Decreased appetite.  Difficulty swallowing. EXAM: MRI HEAD WITHOUT CONTRAST MRA HEAD WITHOUT CONTRAST TECHNIQUE: Multiplanar, multiecho pulse sequences of the brain and surrounding structures were obtained without intravenous contrast. Angiographic images of the head were obtained using MRA technique without contrast. COMPARISON:  CT head 05/04/2017.  CT head 10/29/2017. FINDINGS: MRI HEAD FINDINGS Brain: Subacute RIGHT MCA territory infarction involves the superior temporal lobe, insula, posterior frontal lobe, and RIGHT parietal lobe, involving cortex and white matter. Developing encephalomalacia. No significant blood products. Generalized atrophy, not unexpected for age. Moderately advanced T2 and FLAIR hyperintensities throughout the white matter, consistent with small vessel disease. Vascular: Discussed below. Skull and upper cervical spine: Normal for age marrow signal. Sinuses/Orbits: No layering sinus fluid or significant opacity. No acute orbital findings. BILATERAL cataract extraction. Other: Trace LEFT mastoid effusion. MRA HEAD FINDINGS Internal carotid arteries demonstrate no flow-limiting stenosis or occlusion. Basilar artery widely patent with vertebrals codominant. Slight irregularity of RIGHT M1 MCA. High-grade stenosis of the RIGHT inferior M2 MCA branch. Short segment occlusion of the RIGHT M2 superior division. There is corresponding decreased flow related enhancement within the visualized RIGHT MCA M3 branches, with superimposed intracranial atherosclerotic narrowing within the sylvian fissure and beyond. Normal LEFT M1 MCA. Superior LEFT M2 MCA division patent. Inferior LEFT M2 MCA branch high-grade stenosis. Hypoplastic RIGHT A1 ACA, with robust/dominant LEFT A1 ACA. Patent anterior communicating artery and both distal anterior cerebral arteries. High-grade stenosis of both posterior cerebral arteries proximally, tandem LEFT P2 stenosis. Poor flow related enhancement within the visualized  distal LEFT PCA. LEFT superior cerebellar artery and RIGHT posterior inferior cerebellar artery are well visualized and unremarkable. Other cerebellar branches poorly visualized. IMPRESSION: Subacute RIGHT MCA territory infarction corresponds with the CT findings. No evidence of acute ischemia. No superimposed hemorrhage. Atrophy and small vessel disease. Intracranial atherosclerotic change, most notably affecting the superior and inferior M2 RIGHT MCA branches. If further investigation desired, recommend CTA head neck. Electronically Signed   By: Elsie Stain M.D.   On: 10/08/2017 11:33   US Carotid Bilateral (at [facility] And [facility] Only)  Result Date: 10/08/2017 CLINICAL DATA:  Stroke with difficulty swallowing. EXAM: BILATERAL CAROTID DUPLEX ULTRASOUND TECHNIQUE: Wallace Cullens scale imaging, color Doppler and duplex ultrasound were performed of bilateral carotid and vertebral arteries in the neck. COMPARISON:  None. FINDINGS: Criteria: Quantification of carotid stenosis is based on velocity parameters that correlate the residual internal carotid diameter with NASCET-based stenosis levels, using the diameter of the distal internal carotid lumen as the denominator for stenosis measurement. The following velocity measurements were obtained: RIGHT ICA:  52 cm/sec CCA:  64 cm/sec SYSTOLIC ICA/CCA RATIO:  0.8 DIASTOLIC ICA/CCA RATIO:  2.5 ECA:  146 cm/sec LEFT ICA:  85 cm/sec CCA:  76 cm/sec SYSTOLIC ICA/CCA RATIO:  1.1 DIASTOLIC ICA/CCA RATIO:  1.8 ECA:  61 cm/sec RIGHT CAROTID ARTERY: Small amount of echogenic plaque at the carotid bulb and origin of the internal and external carotid arteries. External carotid artery is patent with normal waveform. Normal waveforms and velocities in the internal carotid artery. RIGHT VERTEBRAL ARTERY: Antegrade flow and normal waveform in the right vertebral artery. LEFT CAROTID ARTERY: Echogenic plaque at the left carotid bulb and origin of the internal carotid artery. External carotid artery  is patent with normal waveform. Normal waveforms and velocities in the internal carotid artery. LEFT VERTEBRAL ARTERY: Antegrade flow and normal waveform in the left vertebral artery. IMPRESSION: Mild atherosclerotic disease in the carotid arteries. Plaque is predominantly at the carotid bulbs and bifurcations. Estimated degree of stenosis in the internal carotid arteries is less than 50% bilaterally. Patent vertebral arteries with antegrade flow. Electronically Signed   By: Richarda Overlie M.D.   On: 10/08/2017 11:17   Mr Maxine Glenn Head/brain MV Cm  Result Date: 10/08/2017 CLINICAL DATA:  Decreased appetite.  Difficulty swallowing. EXAM: MRI HEAD WITHOUT CONTRAST MRA HEAD WITHOUT CONTRAST TECHNIQUE: Multiplanar, multiecho pulse sequences of the brain and surrounding structures were obtained without intravenous contrast. Angiographic images of the head were obtained using MRA technique without contrast. COMPARISON:  CT head 05/04/2017.  CT head 10/04/2017. FINDINGS: MRI HEAD FINDINGS Brain: Subacute RIGHT MCA territory infarction involves the superior temporal lobe, insula, posterior frontal lobe, and RIGHT parietal lobe, involving cortex and white matter. Developing encephalomalacia. No significant blood products. Generalized atrophy, not unexpected for age. Moderately advanced T2 and FLAIR hyperintensities throughout the white matter, consistent with small vessel disease. Vascular: Discussed below. Skull and upper cervical spine: Normal for age marrow signal. Sinuses/Orbits: No layering sinus fluid or significant opacity. No acute orbital findings. BILATERAL cataract extraction. Other: Trace LEFT mastoid effusion. MRA HEAD FINDINGS Internal carotid arteries demonstrate no flow-limiting stenosis or occlusion. Basilar artery widely patent with vertebrals codominant. Slight irregularity of RIGHT M1 MCA. High-grade stenosis of the RIGHT inferior M2 MCA branch. Short segment occlusion of the RIGHT M2 superior division. There  is corresponding decreased flow related enhancement within the visualized RIGHT MCA M3 branches, with superimposed intracranial atherosclerotic narrowing within the sylvian fissure and beyond. Normal LEFT M1 MCA. Superior LEFT M2 MCA division patent. Inferior LEFT M2 MCA branch high-grade stenosis. Hypoplastic RIGHT A1 ACA, with robust/dominant LEFT A1 ACA. Patent anterior communicating artery and both distal anterior cerebral arteries. High-grade stenosis of both posterior cerebral arteries proximally, tandem LEFT P2 stenosis. Poor flow related enhancement within the visualized distal LEFT PCA. LEFT superior cerebellar artery and RIGHT posterior inferior cerebellar artery are well visualized and unremarkable. Other cerebellar branches poorly visualized. IMPRESSION: Subacute RIGHT MCA territory infarction corresponds with the CT findings. No evidence of acute ischemia. No superimposed hemorrhage. Atrophy and small vessel disease. Intracranial atherosclerotic change, most notably affecting the superior and inferior M2 RIGHT MCA branches. If further investigation desired, recommend CTA head neck. Electronically Signed   By: Elsie Stain M.D.   On: 10/08/2017 11:33    Assessment: 84 y.o. female presenting with difficulty swallowing.  MRI of the brain reviewed and shows a subacute right MCA infarct with right M2 short segment occlusion and left M2 severe stenosis.  Suspect atherosclerotic etiology but can not rule out embolic etiology.  Carotid dopplers show no evidence of hemodynamically significant stenosis.  Echocardiogram pending.  A1c 5.5, LDL 28.  On ASA and Plavix as an outpatient.    Stroke Risk Factors - hypertension  Plan: 1. PT consult,  OT consult, Speech consult 2. Echocardiogram pending 3. Prophylactic therapy-Continue ASA and Plavix once able to take po.  Until that time would continue ASA rectally.   4. NPO until RN stroke swallow screen 5. Telemetry monitoring 6. Frequent neuro  checks   Thana Farr, MD Neurology [phone] 10/08/2017, 1:23 PM

## 2017-10-08 NOTE — Progress Notes (Signed)
SLP Cancellation Note  Patient Details Name: [name] FleetKatie L [name] MRN: [id number] DOB: 12/09/1920   Cancelled treatment:       Reason Eval/Treat Not Completed: Patient at procedure or test/unavailable(chart reviewed; ST services will return in PM. NSG consulted)    Jerilynn SomKatherine Watson, MS, CCC-SLP [facility] SouthWatson,Katherine 10/08/2017, 9:33 AM

## 2017-10-08 NOTE — Evaluation (Signed)
Occupational Therapy Evaluation Patient Details Name: [name] MRN: [id number] Gary FleetDOB: [birth year] Today's Date: 10/08/2017    History of Present Illness Pt is a89 y.o.femalewho presents with decreased appetite and difficulty swallowing. She was brought in by family members who state that over the last day or 2 she has been less active and has had some difficulty swallowing. In the ED she was found on CT imaging to have a significant right-sided stroke. Patient is unable to contribute significant information to her HPI, although she is able to follow most simple commands. Hospitalist were called for admission.  Assessment includes: Acute MCA CVA, acute on chronic renal failure, essential HTN, gout, dementia, and R toe osteomyelitis.   Clinical Impression   Pt is 84 year old female who presents with new onset of acute MCA CVA (see above for presenting problems).  Pt presents with difficulty answering questions that are not yes/no but can continue to sentences when asked a question about prior function.  She resides at [facility] [place] and her long time caregiver and her husband were present for evaluation.  She wears bifocals at all times with no changes in vision.  She does not have any assymmetry with muscle strength for L side but has significant arthritic changes in bilateral hands and could benefit from built up utensils, dycem, 2 handled cups, rocker knife, etc which were reviewed with pt and caregiver.  She is impulsive with mod assist to transition from supine to sitting up and standing but after she is standing she can ambulate with FWW with min assist and cues.  She has significant muscle wasting and has lost weight and down to 87 pounds.  Tasks requiring planning are difficult.  Rec continued OT while in hospital to continue to work on increasing independence in ADLs, balance and functional mobility training, coordination exercises and family ed and [facility]  after [facility].    Follow Up Recommendations  Home health OT;Supervision/Assistance - 24 hour    Equipment Recommendations  Other (comment)(may benefit from adaptive equip for feeding)    Recommendations for Other Services       Precautions / Restrictions Precautions Precautions: Fall Restrictions Weight Bearing Restrictions: No      Mobility Bed Mobility Overal bed mobility: Needs Assistance Bed Mobility: Supine to Sit;Sit to Supine     Supine to sit: Mod assist Sit to supine: Min assist   General bed mobility comments: Extensive verbal and tactile cues for sequencing  Transfers Overall transfer level: Needs assistance Equipment used: Rolling walker (2 wheeled) Transfers: Sit to/from Stand Sit to Stand: Min assist         General transfer comment: Extensive verbal and tactile cues for sequencing    Balance Overall balance assessment: Needs assistance Sitting-balance support: Feet unsupported;Feet supported;No upper extremity supported Sitting balance-Leahy Scale: Good     Standing balance support: Bilateral upper extremity supported;During functional activity Standing balance-Leahy Scale: Good                             ADL either performed or assessed with clinical judgement   ADL Overall ADL's : Needs assistance/impaired Eating/Feeding: Set up;Minimal assistance Eating/Feeding Details (indicate cue type and reason): pt may benefit from foam utensil holders for feeding due to weakness in grasp and UEs and arthritic changes in joints of hands Grooming: Wash/dry hands;Wash/dry face;Set up;Cueing for sequencing;Bed level Grooming Details (indicate cue type and reason): cues to  initiate task         Upper Body Dressing : Set up;Minimal assistance Upper Body Dressing Details (indicate cue type and reason): min assist and cues to start and complete task Lower Body Dressing: Set up;Minimal assistance;Cueing for safety Lower Body Dressing  Details (indicate cue type and reason): pt is impulsive and requires cues and assist to slow down to complete task slowly and difficulty maintaining attention to task for LB dressing---foot painful from recent angioplasty per caregiver report Toilet Transfer: Set up;Minimal assistance;Stand-pivot;Regular Toilet;RW             General ADL Comments: Pt was resistant to using bulit up utensils at group home but may be willing to now per caregiver.  She did not want to "look different" before.  Educated caregiver and pt about rec adaptive products for ADLs like rocker knife, dycem, 2 handled cups, built up utensils     Vision Baseline Vision/History: Wears glasses Wears Glasses: At all times Patient Visual Report: No change from baseline Additional Comments: general tracking and convergence good     Perception     Praxis      Pertinent Vitals/Pain Pain Assessment: No/denies pain     Hand Dominance Right   Extremity/Trunk Assessment Upper Extremity Assessment Upper Extremity Assessment: Generalized weakness(no assymmetry noted from R to L but is frail and only weighs 87 pounds)   Lower Extremity Assessment Lower Extremity Assessment: Defer to PT evaluation RLE Deficits / Details: General weakness with no asymmetrical strength deficits noted RLE Sensation: (Difficult to assess secondary to cognition) LLE Deficits / Details: General weakness with no asymmetrical strength deficits noted LLE Sensation: (Difficult to assess secondary to cognition)       Communication Communication Communication: Expressive difficulties   Cognition Arousal/Alertness: Awake/alert Behavior During Therapy: WFL for tasks assessed/performed Overall Cognitive Status: History of cognitive impairments - at baseline                                 General Comments: Pt's caregiver present and stated that she would say grace at all meals, help take out trash, clear dishes, check bathroom and  replace toilet paper as needed.  She enjoyed having jobs to do.   General Comments       Exercises Total Joint Exercises Ankle Circles/Pumps: AROM;Both;10 reps(Extensive verbal, visual, and tactile cues for all therex technique) Heel Slides: AROM;Both;AAROM;5 reps Hip ABduction/ADduction: AROM;AAROM;Both;5 reps;10 reps Straight Leg Raises: AROM;AAROM;5 reps;Both;10 reps Long Arc Quad: AROM;Both;10 reps Knee Flexion: AROM;Both;10 reps   Shoulder Instructions      Home Living Family/patient expects to be discharged to:: Group home                                 Additional Comments: [facility] in [place] with 1 roomate      Prior Functioning/Environment Level of Independence: Needs assistance  Gait / Transfers Assistance Needed: SBA with amb in group home mostly without AD and sometimes with a SPC; no fall history other than fall just prior to this acute care admission ADL's / Homemaking Assistance Needed: Pt mostly independent with dressing, feeding, and toileting but required assistance with bathing            OT Problem List: Decreased strength;Impaired balance (sitting and/or standing);Decreased cognition;Decreased activity tolerance;Decreased safety awareness      OT Treatment/Interventions: Self-care/ADL training;DME and/or AE instruction;Therapeutic  activities;Patient/family education    OT Goals(Current goals can be found in the care plan section) Acute Rehab OT Goals Patient Stated Goal: "to get my strength back" OT Goal Formulation: With patient/family Time For Goal Achievement: 10/22/17 Potential to Achieve Goals: Good ADL Goals Pt Will Perform Eating: with set-up;with supervision;with adaptive utensils;sitting Pt Will Perform Lower Body Dressing: with set-up;with supervision;sit to/from stand Pt Will Transfer to Toilet: with set-up;with supervision;stand pivot transfer;bedside commode  OT Frequency: Min 1X/week   Barriers to D/C:             Co-evaluation              AM-PAC PT "6 Clicks" Daily Activity     Outcome Measure Help from another person eating meals?: A Little Help from another person taking care of personal grooming?: A Little Help from another person toileting, which includes using toliet, bedpan, or urinal?: A Little Help from another person bathing (including washing, rinsing, drying)?: A Little Help from another person to put on and taking off regular upper body clothing?: A Little Help from another person to put on and taking off regular lower body clothing?: A Little 6 Click Score: 18   End of Session    Activity Tolerance: Patient tolerated treatment well Patient left: in bed;with call bell/phone within reach;with family/visitor present  OT Visit Diagnosis: Unsteadiness on feet (R26.81);Muscle weakness (generalized) (M62.81);Other symptoms and signs involving cognitive function                Time: 1445-1516 OT Time Calculation (min): 31 min Charges:  OT General Charges $OT Visit: 1 Visit OT Evaluation $OT Eval Low Complexity: 1 Low OT Treatments $Self Care/Home Management : 8-22 mins G-Codes:     Susanne Borders, OTR/L ascom [phone] 10/08/17, 3:38 PM

## 2017-10-08 NOTE — Progress Notes (Signed)
[employer] [facility]   PATIENT NAME: [name]    MR#:  [id number]  DATE OF BIRTH:  1921-03-31  SUBJECTIVE:   Patient here due to poor appetite and difficulty swallowing and being more lethargic than usual and therefore brought to the hospital.  Patient noted to have a right MCA distribution stroke.  Patient has underlying dementia and therefore very poor historian.  Patient's friend was at bedside.  REVIEW OF SYSTEMS:    Review of Systems  Unable to perform ROS: Dementia    Nutrition: NPO and await speech eval Tolerating Diet: No Tolerating PT: Await Eval.   DRUG ALLERGIES:  No Known Allergies  VITALS:  Blood pressure (!) 155/40, pulse 85, temperature 98.9 F (37.2 C), temperature source Oral, resp. rate 18, height 4\' 8"  (1.422 m), weight 46.7 kg (103 lb), SpO2 99 %.  PHYSICAL EXAMINATION:   Physical Exam  GENERAL:  84 y.o.-year-old thin patient lying in bed in NAD.  EYES: Pupils equal, round, reactive to light and accommodation. No scleral icterus. Extraocular muscles intact.  HEENT: Head atraumatic, normocephalic. Oropharynx and nasopharynx clear.  NECK:  Supple, no jugular venous distention. No thyroid enlargement, no tenderness.  LUNGS: Normal breath sounds bilaterally, no wheezing, rales, rhonchi. No use of accessory muscles of respiration.  CARDIOVASCULAR: S1, S2 normal. No murmurs, rubs, or gallops.  ABDOMEN: Soft, nontender, nondistended. Bowel sounds present. No organomegaly or mass.  EXTREMITIES: No cyanosis, clubbing or edema b/l.  Right 3rd Toe discoloration but pain or redness noted proximally.   NEUROLOGIC: Cranial nerves II through XII are intact. No focal Motor or sensory deficits b/l.  Globally weak.  With the question of daily PSYCHIATRIC: The patient is alert and oriented x 1.  SKIN: No obvious rash, lesion, or ulcer.    LABORATORY PANEL:   CBC Recent Labs  Lab 2017-09-21 1723  WBC 12.5*  HGB 12.4  HCT 38.9  PLT  249   ------------------------------------------------------------------------------------------------------------------  Chemistries  Recent Labs  Lab 2017-09-21 1723 10/08/17 0403  NA 134* 138  K 5.1 4.5  CL 105 108  CO2 17* 20*  GLUCOSE 111* 89  BUN 50* 45*  CREATININE 1.92* 1.94*  CALCIUM 8.8* 8.4*  AST 33  --   ALT 15  --   ALKPHOS 120  --   BILITOT 0.7  --    ------------------------------------------------------------------------------------------------------------------  Cardiac Enzymes Recent Labs  Lab 2017-09-21 2023  TROPONINI <0.03   ------------------------------------------------------------------------------------------------------------------  RADIOLOGY:  Dg Chest 2 View  Result Date: 02-24-2018 CLINICAL DATA:  84 year old female with vomiting and diarrhea onset today. EXAM: CHEST - 2 VIEW COMPARISON:  Cervical spine CT 03/10/2010. FINDINGS: Seated AP and lateral views of the chest. Lung volumes are at the upper limits of normal to hyperinflated. Chronic calcified biapical pleural scarring redemonstrated. Mild cardiomegaly. Other mediastinal contours are within normal limits. Visualized tracheal air column is within normal limits. No pneumothorax, pulmonary edema, pleural effusion or confluent pulmonary opacity. Osteopenia. No acute osseous abnormality identified. Negative visible bowel gas pattern. IMPRESSION: No acute cardiopulmonary abnormality. Electronically Signed   By: Odessa FlemingH  Hall M.D.   On: 008-05-2018 20:44   Ct Head Wo Contrast  Result Date: 02-24-2018 CLINICAL DATA:  84 y/o  F; unable to swallow. EXAM: CT HEAD WITHOUT CONTRAST TECHNIQUE: Contiguous axial images were obtained from the base of the skull through the vertex without intravenous contrast. COMPARISON:  05/04/2017 CT head. FINDINGS: Brain: Hypoattenuation with loss of gray-white differentiation involving the right perisylvian temporal  lobe, insula, and extending into right parietal lobe compatible  with acute or subacute infarction, ASPECTS is 6. No hemorrhage or significant mass effect. Stable background of chronic microvascular ischemic changes and parenchymal volume loss of the brain. No extra-axial collection, hydrocephalus, or effacement of basilar cisterns. Vascular: Calcific atherosclerosis of the carotid siphons. Skull: Normal. Negative for fracture or focal lesion. Sinuses/Orbits: No acute finding. Other: None. IMPRESSION: 1. Right MCA distribution acute or subacute infarction involving right temporal lobe, insula, and right parietal lobe. No hemorrhage or significant mass effect. ASPECTS is 6. 2. Stable background of chronic microvascular ischemic changes and parenchymal volume loss of the brain. These results were called by telephone at the time of interpretation on 09/26/2017 at 8:37 pm to Dr. Ileana Roup , who verbally acknowledged these results. Electronically Signed   By: Mitzi Hansen M.D.   On: 09/20/2017 20:40   Ct Soft Tissue Neck Wo Contrast  Result Date: 09/18/2017 CLINICAL DATA:  84 y/o  F; unable to swallow. EXAM: CT NECK WITHOUT CONTRAST TECHNIQUE: Multidetector CT imaging of the neck was performed following the standard protocol without intravenous contrast. COMPARISON:  03/10/2010 CT of cervical spine. FINDINGS: Pharynx and larynx: Normal. No mass or swelling. Salivary glands: No inflammation, mass, or stone. Thyroid: 7 mm nodule in the right lobe of the thyroid gland. Lymph nodes: None enlarged or abnormal density. Vascular: Calcific atherosclerosis of aortic arch and carotid siphons. Retropharyngeal submucosal course of the left carotid system. Limited intracranial: Negative. Visualized orbits: Bilateral intra-ocular lens replacement. Mastoids and visualized paranasal sinuses: Clear. Skeleton: C4-5, C5-6, and C6-7 grade 1 anterolisthesis. Moderate cervical spondylosis with discogenic degenerative changes greatest at C5-6 and upper cervical facet hypertrophy. No  high-grade bony canal stenosis. Upper chest: Calcified pleuroparenchymal scarring of the lung apices. Other: None. IMPRESSION: 1. No mass or inflammatory process of the aerodigestive tract identified. 2. Moderate spondylosis of the cervical spine. 3. Calcific atherosclerosis of carotid siphons and aorta. Electronically Signed   By: Mitzi Hansen M.D.   On: 10/11/2017 20:33   Mr Brain Wo Contrast  Result Date: 10/08/2017 CLINICAL DATA:  Decreased appetite.  Difficulty swallowing. EXAM: MRI HEAD WITHOUT CONTRAST MRA HEAD WITHOUT CONTRAST TECHNIQUE: Multiplanar, multiecho pulse sequences of the brain and surrounding structures were obtained without intravenous contrast. Angiographic images of the head were obtained using MRA technique without contrast. COMPARISON:  CT head 05/04/2017.  CT head 09/16/2017. FINDINGS: MRI HEAD FINDINGS Brain: Subacute RIGHT MCA territory infarction involves the superior temporal lobe, insula, posterior frontal lobe, and RIGHT parietal lobe, involving cortex and white matter. Developing encephalomalacia. No significant blood products. Generalized atrophy, not unexpected for age. Moderately advanced T2 and FLAIR hyperintensities throughout the white matter, consistent with small vessel disease. Vascular: Discussed below. Skull and upper cervical spine: Normal for age marrow signal. Sinuses/Orbits: No layering sinus fluid or significant opacity. No acute orbital findings. BILATERAL cataract extraction. Other: Trace LEFT mastoid effusion. MRA HEAD FINDINGS Internal carotid arteries demonstrate no flow-limiting stenosis or occlusion. Basilar artery widely patent with vertebrals codominant. Slight irregularity of RIGHT M1 MCA. High-grade stenosis of the RIGHT inferior M2 MCA branch. Short segment occlusion of the RIGHT M2 superior division. There is corresponding decreased flow related enhancement within the visualized RIGHT MCA M3 branches, with superimposed intracranial  atherosclerotic narrowing within the sylvian fissure and beyond. Normal LEFT M1 MCA. Superior LEFT M2 MCA division patent. Inferior LEFT M2 MCA branch high-grade stenosis. Hypoplastic RIGHT A1 ACA, with robust/dominant LEFT A1 ACA. Patent anterior communicating artery and  both distal anterior cerebral arteries. High-grade stenosis of both posterior cerebral arteries proximally, tandem LEFT P2 stenosis. Poor flow related enhancement within the visualized distal LEFT PCA. LEFT superior cerebellar artery and RIGHT posterior inferior cerebellar artery are well visualized and unremarkable. Other cerebellar branches poorly visualized. IMPRESSION: Subacute RIGHT MCA territory infarction corresponds with the CT findings. No evidence of acute ischemia. No superimposed hemorrhage. Atrophy and small vessel disease. Intracranial atherosclerotic change, most notably affecting the superior and inferior M2 RIGHT MCA branches. If further investigation desired, recommend CTA head neck. Electronically Signed   By: Elsie Stain M.D.   On: 10/08/2017 11:33   US Carotid Bilateral (at [facility] And [facility] Only)  Result Date: 10/08/2017 CLINICAL DATA:  Stroke with difficulty swallowing. EXAM: BILATERAL CAROTID DUPLEX ULTRASOUND TECHNIQUE: Wallace Cullens scale imaging, color Doppler and duplex ultrasound were performed of bilateral carotid and vertebral arteries in the neck. COMPARISON:  None. FINDINGS: Criteria: Quantification of carotid stenosis is based on velocity parameters that correlate the residual internal carotid diameter with NASCET-based stenosis levels, using the diameter of the distal internal carotid lumen as the denominator for stenosis measurement. The following velocity measurements were obtained: RIGHT ICA:  52 cm/sec CCA:  64 cm/sec SYSTOLIC ICA/CCA RATIO:  0.8 DIASTOLIC ICA/CCA RATIO:  2.5 ECA:  146 cm/sec LEFT ICA:  85 cm/sec CCA:  76 cm/sec SYSTOLIC ICA/CCA RATIO:  1.1 DIASTOLIC ICA/CCA RATIO:  1.8 ECA:  61 cm/sec RIGHT CAROTID  ARTERY: Small amount of echogenic plaque at the carotid bulb and origin of the internal and external carotid arteries. External carotid artery is patent with normal waveform. Normal waveforms and velocities in the internal carotid artery. RIGHT VERTEBRAL ARTERY: Antegrade flow and normal waveform in the right vertebral artery. LEFT CAROTID ARTERY: Echogenic plaque at the left carotid bulb and origin of the internal carotid artery. External carotid artery is patent with normal waveform. Normal waveforms and velocities in the internal carotid artery. LEFT VERTEBRAL ARTERY: Antegrade flow and normal waveform in the left vertebral artery. IMPRESSION: Mild atherosclerotic disease in the carotid arteries. Plaque is predominantly at the carotid bulbs and bifurcations. Estimated degree of stenosis in the internal carotid arteries is less than 50% bilaterally. Patent vertebral arteries with antegrade flow. Electronically Signed   By: Richarda Overlie M.D.   On: 10/08/2017 11:17   Mr Maxine Glenn Head/brain ZO Cm  Result Date: 10/08/2017 CLINICAL DATA:  Decreased appetite.  Difficulty swallowing. EXAM: MRI HEAD WITHOUT CONTRAST MRA HEAD WITHOUT CONTRAST TECHNIQUE: Multiplanar, multiecho pulse sequences of the brain and surrounding structures were obtained without intravenous contrast. Angiographic images of the head were obtained using MRA technique without contrast. COMPARISON:  CT head 05/04/2017.  CT head October 10, 2017. FINDINGS: MRI HEAD FINDINGS Brain: Subacute RIGHT MCA territory infarction involves the superior temporal lobe, insula, posterior frontal lobe, and RIGHT parietal lobe, involving cortex and white matter. Developing encephalomalacia. No significant blood products. Generalized atrophy, not unexpected for age. Moderately advanced T2 and FLAIR hyperintensities throughout the white matter, consistent with small vessel disease. Vascular: Discussed below. Skull and upper cervical spine: Normal for age marrow signal.  Sinuses/Orbits: No layering sinus fluid or significant opacity. No acute orbital findings. BILATERAL cataract extraction. Other: Trace LEFT mastoid effusion. MRA HEAD FINDINGS Internal carotid arteries demonstrate no flow-limiting stenosis or occlusion. Basilar artery widely patent with vertebrals codominant. Slight irregularity of RIGHT M1 MCA. High-grade stenosis of the RIGHT inferior M2 MCA branch. Short segment occlusion of the RIGHT M2 superior division. There is corresponding decreased flow related enhancement within  the visualized RIGHT MCA M3 branches, with superimposed intracranial atherosclerotic narrowing within the sylvian fissure and beyond. Normal LEFT M1 MCA. Superior LEFT M2 MCA division patent. Inferior LEFT M2 MCA branch high-grade stenosis. Hypoplastic RIGHT A1 ACA, with robust/dominant LEFT A1 ACA. Patent anterior communicating artery and both distal anterior cerebral arteries. High-grade stenosis of both posterior cerebral arteries proximally, tandem LEFT P2 stenosis. Poor flow related enhancement within the visualized distal LEFT PCA. LEFT superior cerebellar artery and RIGHT posterior inferior cerebellar artery are well visualized and unremarkable. Other cerebellar branches poorly visualized. IMPRESSION: Subacute RIGHT MCA territory infarction corresponds with the CT findings. No evidence of acute ischemia. No superimposed hemorrhage. Atrophy and small vessel disease. Intracranial atherosclerotic change, most notably affecting the superior and inferior M2 RIGHT MCA branches. If further investigation desired, recommend CTA head neck. Electronically Signed   By: Elsie Stain M.D.   On: 10/08/2017 11:33     ASSESSMENT AND PLAN:   84 year old female with past medical history of dementia, chronic kidney disease stage III, GERD, gout, hypertension, osteoarthritis who presented to the hospital due to weakness, lethargy and difficulty swallowing and noted to have an acute CVA.  1.  Acute  CVA-this is the cause of patient's weakness lethargy and difficulty swallowing.  Patient on CT had an MRI of the brain does show a MCA distribution stroke. - Continue aspirin per rectum now, once passed swallow eval was switched to aspirin, Plavix.  Appreciate neurology input. -Await echocardiogram results, carotid duplex showing no hemodynamically significant carotid artery stenosis. - Await speech, physical therapy/Occupational Therapy evaluation.  Next  2.  Acute on chronic renal failure-continue gentle IV fluids, follow BUN and creatinine and urine output.  Renal dose meds, avoid nephrotoxins.  3.  Essential hypertension- blood pressure slightly uncontrolled given the acute CVA.  Will place on IV as needed hydralazine -Will resume patient's clonidine, felodipine, metoprolol when patient is taking p.o. well.  4.  History of gout-no acute attack. Resume allopurinol when taking p.o.  5.  Dementia with behavioral disturbance- will resume Aricept, Seroquel.  6. Hx of Right Toe Osteo - cont. Doxy.   All the records are reviewed and case discussed with Care Management/Social Worker. Management plans discussed with the patient, family and they are in agreement.  CODE STATUS: DNR  DVT Prophylaxis: Heparin sub Q  TOTAL TIME TAKING CARE OF THIS PATIENT: 30 minutes.   POSSIBLE D/C IN 1-2 DAYS, DEPENDING ON CLINICAL CONDITION.   Houston Siren M.D on 10/08/2017 at 2:07 PM  Between 7am to 6pm - Pager - [phone]  After 6pm go to [email or web address] - Social research officer, government  [employer] [facility]  Office  [phone]  CC: Primary care physician; Jaclyn Shaggy, MD

## 2017-10-08 NOTE — Progress Notes (Signed)
Tried to feed pt clear liquid diet at supper. Sit upright/ gave pt 2 spoons of broth. Pt tol well. Gave the 3rd spoon and pt started sputtering/  Spitting/ and coughing. Pt then started acting like she was ging to throw up and gagging.  What  Went down from the 3 spoonfuls all came back  In a5-10 min period. Pt finally settled down.  No further attempt to feed. Pt became anxious and agitated during this process.

## 2017-10-08 NOTE — Evaluation (Signed)
Clinical/Bedside Swallow Evaluation Patient Details  Name: [name] MRN: [id number] Date of Birth: Jan 26, 1921  Today's Date: 10/08/2017 Time: SLP Start Time (ACUTE ONLY): 1345 SLP Stop Time (ACUTE ONLY): 1440 SLP Time Calculation (min) (ACUTE ONLY): 55 min  Past Medical History:  Past Medical History:  Diagnosis Date  . CKD (chronic kidney disease)   . GERD (gastroesophageal reflux disease)   . Gout   . Hypertension   . Osteoarthritis   . Renal disorder    Past Surgical History:  Past Surgical History:  Procedure Laterality Date  . LOWER EXTREMITY ANGIOGRAPHY Right 08/21/2017   Procedure: LOWER EXTREMITY ANGIOGRAPHY;  Surgeon: Renford Dills, MD;  Location: [facility] INVASIVE CV LAB;  Service: Cardiovascular;  Laterality: Right;  . LOWER EXTREMITY ANGIOGRAPHY Right 09/25/2017   Procedure: LOWER EXTREMITY ANGIOGRAPHY;  Surgeon: Renford Dills, MD;  Location: [facility] INVASIVE CV LAB;  Service: Cardiovascular;  Laterality: Right;  . NO PAST SURGERIES     HPI: Per admission History:  [name]  is a 84 y.o. female who presents with decreased appetite and difficulty swallowing.  She was brought in by family members who state that over the last day or 2 she has been less active and has had some difficulty swallowing.  Here in the ED she was found on CT imaging to have a significant right-sided stroke.  Patient is unable to contribute significant information to her HPI, although she is able to follow most simple commands.  Hospitalist were called for admission       Assessment / Plan / Recommendation Clinical Impression  Pt presents with moderate dysphagia with inability to tolerate anything other than small sips of liquid. Pt was pleasant and alert and followed simple commands adequate for bedside swallow eval. Pt resides in a group home, caretakers were present and supportive during the assessment today. Caretakers report Pt was able to eat fine until 24 hours ago when she started  spittingout her food and coughing up phlegm reporting she could not swallow. Today, Pt was able to take small sips of thin liquid without difficulty. Pt tried one bite of applesauce and immdiately began gagging and coughing up thick phlegm for over 5 minutes. Pt became upset, began shaking and dry heaving. After Pt calmed down, she attempted a few more sips without difficulty. Rec clear liquid diet for now. ST to reassess tomorrow. At this time it is difficult to determine pharngeal verses esophgeal dyspghagia or Pt just being nauseated. Intake is likely to be poor. Caregivers also gave concerns over Pts DNR bracelet. Nsg made aware of the need to go talk to caregivers about DNR status. Educated Nsg on current diet. ST to reassess tomorrow in hopes of upgrading. SLP Visit Diagnosis: Dysphagia, oropharyngeal phase (R13.12)    Aspiration Risk  Moderate aspiration risk;Risk for inadequate nutrition/hydration    Diet Recommendation Thin liquid   Liquid Administration via: Straw Medication Administration: Via alternative means Supervision: Full supervision/cueing for compensatory strategies Compensations: Minimize environmental distractions;Slow rate;Small sips/bites Postural Changes: Seated upright at 90 degrees;Remain upright for at least 30 minutes after po intake    Other  Recommendations Recommended Consults: Other (Comment)(May need GI assessment)   Follow up Recommendations Skilled Nursing facility      Frequency and Duration min 2x/week          Prognosis Prognosis for Safe Diet Advancement: Guarded      Swallow Study   General Date of Onset: 2017/10/11 Type of Study: Bedside Swallow Evaluation Diet Prior  to this Study: NPO Temperature Spikes Noted: No Respiratory Status: Room air History of Recent Intubation: No Behavior/Cognition: Alert;Pleasant mood Oral Cavity Assessment: Within Functional Limits Oral Care Completed by SLP: No Oral Cavity - Dentition: Poor  condition;Edentulous Vision: Impaired for self-feeding Self-Feeding Abilities: Needs assist;Needs set up Patient Positioning: Upright in bed Baseline Vocal Quality: Normal Volitional Swallow: Able to elicit    Oral/Motor/Sensory Function Overall Oral Motor/Sensory Function: Within functional limits   Ice Chips Ice chips: Within functional limits   Thin Liquid Thin Liquid: Within functional limits Presentation: Straw;Cup    Nectar Thick Nectar Thick Liquid: Not tested   Honey Thick Honey Thick Liquid: Not tested   Puree Puree: Impaired Presentation: Spoon Oral Phase Functional Implications: Other (comment)(Pt began gagging and spit out bolus) Pharyngeal Phase Impairments: Cough - Immediate;Other (comments)(Began spitting up thick phlegm for almost 5 minutes. )   Solid   GO   Solid: Not tested        Eather ColasJennifer A Inna Tisdell 10/08/2017,6:03 PM

## 2017-10-08 NOTE — Progress Notes (Signed)
*  PRELIMINARY RESULTS* Echocardiogram 2D Echocardiogram has been performed.  Cristela BlueHege, Shadee Rathod 10/08/2017, 3:06 PM

## 2017-10-08 NOTE — Plan of Care (Signed)
  Problem: Education: Goal: Knowledge of disease or condition will improve Outcome: Not Progressing Goal: Knowledge of secondary prevention will improve Outcome: Not Progressing Goal: Knowledge of patient specific risk factors addressed and post discharge goals established will improve Outcome: Not Progressing   Problem: Coping: Goal: Will verbalize positive feelings about self Outcome: Progressing Goal: Will identify appropriate support needs Outcome: Progressing   Problem: Nutrition: Goal: Risk of aspiration will decrease Outcome: Progressing   Problem: Ischemic Stroke/TIA Tissue Perfusion: Goal: Complications of ischemic stroke/TIA will be minimized Outcome: Progressing   Problem: Activity: Goal: Risk for activity intolerance will decrease Outcome: Progressing   Problem: Safety: Goal: Ability to remain free from injury will improve Outcome: Progressing

## 2017-10-08 NOTE — Progress Notes (Signed)
Bedside swallow eval completed. Report will follow later today. Pt tolerated a few sips of water with no s/s of aspiration. However, started dry heaving and spitting up thick phlegm after one bite of applesauce. It took almost 15 minutes for Pt to recover. She began shaking and crying. Will place on clear liquid diet for now. Reassess tomorrow again with other consistencies. Strict aspiration precautions. Stop if any s/s of aspiration. Must be supervised for meals. PO intake likely to be very limited.

## 2017-10-08 NOTE — Evaluation (Signed)
Physical Therapy Evaluation Patient Details Name: [name] MRN: [id number] DOB: [birth year] Today's Date: 10/08/2017   History of Present Illness  Pt is a89 y.o.femalewho presents with decreased appetite and difficulty swallowing. She was brought in by family members who state that over the last day or 2 she has been less active and has had some difficulty swallowing. In the ED she was found on CT imaging to have a significant right-sided stroke. Patient is unable to contribute significant information to her HPI, although she is able to follow most simple commands. Hospitalist were called for admission.  Assessment includes: Acute MCA CVA, acute on chronic renal failure, essential HTN, gout, dementia, and R toe osteomyelitis.    Clinical Impression  Pt presents with minor deficits in strength, transfers, mobility, gait, balance, and activity tolerance but overall appears to be only minimally weaker functionally than baseline.  Pt showed no noticeable asymmetrical deficits in strength with other testing complicated by poor cognition.  Pt required min-mod A with bed mobility tasks but again this appears to be mostly due to cognition and difficulty following commands.  Pt required only min A with standing from bed in lowest position and then was able to amb 30' with a RW and CGA with no LOB.  Will schedule pt for 2x/wk frequency secondary to pt appears to be close to functional baseline.  Pt will benefit from a likely short duration of  HHPT services upon discharge to safely address above deficits for decreased caregiver assistance and eventual return to PLOF.      Follow Up Recommendations Home health PT and 24/hr assistance with functional mobility and OOB activities    Equipment Recommendations  Rolling walker with 5" wheels    Recommendations for Other Services       Precautions / Restrictions Precautions Precautions: Fall Restrictions Weight Bearing Restrictions: No       Mobility  Bed Mobility Overal bed mobility: Needs Assistance Bed Mobility: Supine to Sit;Sit to Supine     Supine to sit: Mod assist Sit to supine: Min assist   General bed mobility comments: Extensive verbal and tactile cues for sequencing  Transfers Overall transfer level: Needs assistance Equipment used: Rolling walker (2 wheeled) Transfers: Sit to/from Stand Sit to Stand: Min assist         General transfer comment: Extensive verbal and tactile cues for sequencing  Ambulation/Gait Ambulation/Gait assistance: Min guard Ambulation Distance (Feet): 30 Feet Assistive device: Rolling walker (2 wheeled) Gait Pattern/deviations: Step-through pattern;Decreased step length - right;Decreased step length - left;Trunk flexed   Gait velocity interpretation: Below normal speed for age/gender General Gait Details: Slow cadence with short B step length but steady without LOB  Stairs            Wheelchair Mobility    Modified Rankin (Stroke Patients Only)       Balance Overall balance assessment: Needs assistance Sitting-balance support: Feet unsupported;Feet supported;No upper extremity supported Sitting balance-Leahy Scale: Good     Standing balance support: Bilateral upper extremity supported;During functional activity Standing balance-Leahy Scale: Good                               Pertinent Vitals/Pain Pain Assessment: No/denies pain    Home Living Family/patient expects to be discharged to:: Group home(History from grand niece at bedside)                 Additional Comments: Similar to ALF  level of care provided at patient's family care home    Prior Function Level of Independence: Needs assistance   Gait / Transfers Assistance Needed: SBA with amb in group home mostly without AD and sometimes with a SPC; no fall history other than fall just prior to this acute care admission  ADL's / Homemaking Assistance Needed: Pt mostly  independent with dressing, feeding, and toileting but required assistance with bathing        Hand Dominance        Extremity/Trunk Assessment   Upper Extremity Assessment Upper Extremity Assessment: Generalized weakness    Lower Extremity Assessment Lower Extremity Assessment: Generalized weakness;RLE deficits/detail;LLE deficits/detail RLE Deficits / Details: General weakness with no asymmetrical strength deficits noted RLE Sensation: (Difficult to assess secondary to cognition) LLE Deficits / Details: General weakness with no asymmetrical strength deficits noted LLE Sensation: (Difficult to assess secondary to cognition)       Communication      Cognition Arousal/Alertness: Awake/alert Behavior During Therapy: WFL for tasks assessed/performed Overall Cognitive Status: History of cognitive impairments - at baseline                                        General Comments      Exercises Total Joint Exercises Ankle Circles/Pumps: AROM;Both;10 reps(Extensive verbal, visual, and tactile cues for all therex technique) Heel Slides: AROM;Both;AAROM;5 reps Hip ABduction/ADduction: AROM;AAROM;Both;5 reps;10 reps Straight Leg Raises: AROM;AAROM;5 reps;Both;10 reps Long Arc Quad: AROM;Both;10 reps Knee Flexion: AROM;Both;10 reps   Assessment/Plan    PT Assessment Patient needs continued PT services  PT Problem List Decreased strength;Decreased activity tolerance;Decreased balance;Decreased mobility;Decreased knowledge of use of DME       PT Treatment Interventions DME instruction;Gait training;Functional mobility training;Balance training;Therapeutic exercise;Therapeutic activities;Patient/family education    PT Goals (Current goals can be found in the Care Plan section)  Acute Rehab PT Goals PT Goal Formulation: Patient unable to participate in goal setting Time For Goal Achievement: 10/21/17 Potential to Achieve Goals: Fair    Frequency Min  2X/week   Barriers to discharge        Co-evaluation               AM-PAC PT "6 Clicks" Daily Activity  Outcome Measure Difficulty turning over in bed (including adjusting bedclothes, sheets and blankets)?: Unable Difficulty moving from lying on back to sitting on the side of the bed? : Unable Difficulty sitting down on and standing up from a chair with arms (e.g., wheelchair, bedside commode, etc,.)?: Unable Help needed moving to and from a bed to chair (including a wheelchair)?: A Little Help needed walking in hospital room?: A Little Help needed climbing 3-5 steps with a railing? : A Lot 6 Click Score: 11    End of Session Equipment Utilized During Treatment: Gait belt Activity Tolerance: Patient tolerated treatment well Patient left: in bed;with bed alarm set;with call bell/phone within reach Nurse Communication: Mobility status PT Visit Diagnosis: Muscle weakness (generalized) (M62.81);Difficulty in walking, not elsewhere classified (R26.2)    Time: 1039(Session continued 13:16-13:33 waiting for BP to come down per MD request)-1058 PT Time Calculation (min) (ACUTE ONLY): 19 min   Charges:   PT Evaluation $PT Eval Low Complexity: 1 Low PT Treatments $Therapeutic Exercise: 8-22 mins   PT G Codes:        Elly Modena. Scott Sadeel Fiddler PT, DPT 10/08/17, 2:59 PM

## 2017-10-08 NOTE — Plan of Care (Signed)
  Problem: Education: Goal: Knowledge of disease or condition will improve Outcome: Progressing Goal: Knowledge of secondary prevention will improve Outcome: Progressing Goal: Knowledge of patient specific risk factors addressed and post discharge goals established will improve Outcome: Progressing   Problem: Coping: Goal: Will verbalize positive feelings about self Outcome: Progressing Goal: Will identify appropriate support needs Outcome: Progressing   Problem: Nutrition: Goal: Risk of aspiration will decrease Outcome: Progressing   Problem: Ischemic Stroke/TIA Tissue Perfusion: Goal: Complications of ischemic stroke/TIA will be minimized Outcome: Progressing   Problem: Activity: Goal: Risk for activity intolerance will decrease Outcome: Progressing   Problem: Safety: Goal: Ability to remain free from injury will improve Outcome: Progressing

## 2017-10-09 ENCOUNTER — Inpatient Hospital Stay: Payer: Medicare Other

## 2017-10-09 ENCOUNTER — Ambulatory Visit: Payer: Self-pay | Admitting: Physician Assistant

## 2017-10-09 DIAGNOSIS — E43 Unspecified severe protein-calorie malnutrition: Secondary | ICD-10-CM

## 2017-10-09 DIAGNOSIS — Z7189 Other specified counseling: Secondary | ICD-10-CM

## 2017-10-09 DIAGNOSIS — Z515 Encounter for palliative care: Secondary | ICD-10-CM

## 2017-10-09 LAB — BASIC METABOLIC PANEL
ANION GAP: 14 (ref 5–15)
BUN: 42 mg/dL — ABNORMAL HIGH (ref 6–20)
CO2: 18 mmol/L — ABNORMAL LOW (ref 22–32)
Calcium: 8 mg/dL — ABNORMAL LOW (ref 8.9–10.3)
Chloride: 110 mmol/L (ref 101–111)
Creatinine, Ser: 1.92 mg/dL — ABNORMAL HIGH (ref 0.44–1.00)
GFR calc Af Amer: 25 mL/min — ABNORMAL LOW (ref >60)
GFR, EST NON AFRICAN AMERICAN: 21 mL/min — AB (ref >60)
Glucose, Bld: 187 mg/dL — ABNORMAL HIGH (ref 65–99)
POTASSIUM: 4.5 mmol/L (ref 3.5–5.1)
SODIUM: 142 mmol/L (ref 135–145)

## 2017-10-09 LAB — CBC
HEMATOCRIT: 30.3 % — AB (ref 35.0–47.0)
HEMOGLOBIN: 9.8 g/dL — AB (ref 12.0–16.0)
MCH: 25.9 pg — ABNORMAL LOW (ref 26.0–34.0)
MCHC: 32.3 g/dL (ref 32.0–36.0)
MCV: 80.4 fL (ref 80.0–100.0)
Platelets: 194 10*3/uL (ref 150–440)
RBC: 3.77 MIL/uL — AB (ref 3.80–5.20)
RDW: 16.1 % — ABNORMAL HIGH (ref 11.5–14.5)
WBC: 12.2 10*3/uL — AB (ref 3.6–11.0)

## 2017-10-09 MED ORDER — GLYCOPYRROLATE 0.2 MG/ML IJ SOLN
0.2000 mg | INTRAMUSCULAR | Status: DC | PRN
  Filled 2017-10-09: qty 1

## 2017-10-09 MED ORDER — METOPROLOL TARTRATE 5 MG/5ML IV SOLN
5.0000 mg | Freq: Once | INTRAVENOUS | Status: AC
  Administered 2017-10-09: 11:00:00 5 mg via INTRAVENOUS
  Filled 2017-10-09: qty 5

## 2017-10-09 MED ORDER — HYDROMORPHONE HCL 1 MG/ML IJ SOLN
0.5000 mg | INTRAMUSCULAR | Status: DC | PRN
  Administered 2017-10-09 – 2017-10-10 (×3): 0.5 mg via INTRAVENOUS
  Filled 2017-10-09 (×3): qty 1

## 2017-10-09 MED ORDER — LORAZEPAM 2 MG/ML PO CONC: 0.5000 mg | ORAL | Status: DC | PRN

## 2017-10-09 MED ORDER — POLYVINYL ALCOHOL 1.4 % OP SOLN
1.0000 [drp] | Freq: Four times a day (QID) | OPHTHALMIC | Status: DC | PRN
  Filled 2017-10-09: qty 15

## 2017-10-09 MED ORDER — ONDANSETRON HCL 4 MG/2ML IJ SOLN
4.0000 mg | Freq: Four times a day (QID) | INTRAMUSCULAR | Status: DC | PRN
  Administered 2017-10-09: 02:00:00 4 mg via INTRAVENOUS
  Filled 2017-10-09: qty 2

## 2017-10-09 MED ORDER — METOPROLOL TARTRATE 5 MG/5ML IV SOLN: 5.0000 mg | INTRAVENOUS | Status: DC | PRN

## 2017-10-09 MED ORDER — DILTIAZEM HCL 25 MG/5ML IV SOLN
10.0000 mg | Freq: Once | INTRAVENOUS | Status: AC
  Administered 2017-10-09: 10 mg via INTRAVENOUS
  Filled 2017-10-09: qty 5

## 2017-10-09 MED ORDER — HALOPERIDOL LACTATE 5 MG/ML IJ SOLN
2.0000 mg | Freq: Four times a day (QID) | INTRAMUSCULAR | Status: DC | PRN
  Administered 2017-10-09: 17:00:00 2 mg via INTRAVENOUS
  Filled 2017-10-09: qty 1

## 2017-10-09 MED ORDER — BIOTENE DRY MOUTH MT LIQD
15.0000 mL | OROMUCOSAL | Status: DC | PRN
  Filled 2017-10-09: qty 15

## 2017-10-09 MED ORDER — LORAZEPAM 2 MG/ML IJ SOLN
0.5000 mg | INTRAMUSCULAR | Status: DC | PRN
  Administered 2017-10-09: 23:00:00 0.5 mg via INTRAVENOUS
  Filled 2017-10-09: qty 1

## 2017-10-09 MED ORDER — PROCHLORPERAZINE EDISYLATE 5 MG/ML IJ SOLN
10.0000 mg | Freq: Four times a day (QID) | INTRAMUSCULAR | Status: DC | PRN
  Administered 2017-10-09: 10 mg via INTRAVENOUS
  Filled 2017-10-09 (×2): qty 2

## 2017-10-09 MED ORDER — MORPHINE SULFATE (PF) 2 MG/ML IV SOLN
2.0000 mg | Freq: Once | INTRAVENOUS | Status: AC
  Administered 2017-10-09: 2 mg via INTRAVENOUS
  Filled 2017-10-09: qty 1

## 2017-10-09 MED ORDER — SCOPOLAMINE 1 MG/3DAYS TD PT72
1.0000 | MEDICATED_PATCH | TRANSDERMAL | Status: DC
  Administered 2017-10-09: 1.5 mg via TRANSDERMAL
  Filled 2017-10-09: qty 1

## 2017-10-09 NOTE — Plan of Care (Signed)
Pt restless. Oriented to self. Unable to express herself. Moaning and groaning. Vomited brown emesis once. MD notified. Order given for 2 mg of Morphine once IV and Zofran 4 mg IV. Administered per order. Pt still had no improvement. She no longer vomited but she continued to moan and groan. So, this Clinical research associatewriter notified the MD again. Order given for Compazine and Scopalamine  patch. Pt rested for a little while but then she had am labs and was awaken by the phlebotomist . NIH and neuro checks performed this shift. No changes. Labetalol given once this shift 10 mg for elevated b/p and heart rate. Improvement noted. Later this am Central telemetry called to report that pt heart rate had went up to the 140's but did not sustain- went back to 106.  Pt up to BR with one assist and walker once during the night. Voided and had small bm. Offered just now to take her the bathroom, pt responded " I can't". Stroke handout at bedside. Pt with macular degeneration and cognitive deficits so no evidence of learning noted.

## 2017-10-09 NOTE — Progress Notes (Signed)
[facility] -  at [facility]   PATIENT NAME: [name]    MR#:  [id number]  DATE OF BIRTH:  1921/05/23  SUBJECTIVE:   Patient here due to poor appetite and difficulty swallowing and being more lethargic than usual and therefore brought to the hospital.  Patient noted to have a right MCA distribution stroke.    Patient was agitated and awake all night.  Patient this morning was noted to appear pale and noted to be tachycardic with heart rates in the 130s-140s in rapid atrial fibrillation.  Patient was given some IV Cardizem.  Patient did not tolerate speech evaluation yesterday and placed on a clear liquid diet but choked on some clear liquids this morning.  REVIEW OF SYSTEMS:    Review of Systems  Unable to perform ROS: Dementia    Nutrition: Clear liquids Tolerating Diet: No Tolerating PT: Await Eval.   DRUG ALLERGIES:  No Known Allergies  VITALS:  Blood pressure (!) 130/46, pulse (!) 123, temperature (!) 97.4 F (36.3 C), temperature source Oral, resp. rate (!) 22, height 4\' 8"  (1.422 m), weight 39.5 kg (87 lb), SpO2 100 %.  PHYSICAL EXAMINATION:   Physical Exam  GENERAL:  84 y.o.-year-old thin patient lying in bed Lethargic/Encephalopathic  EYES: Pupils equal, round, reactive to light and accommodation. No scleral icterus. Extraocular muscles intact.  HEENT: Head atraumatic, normocephalic. Oropharynx and nasopharynx clear.  NECK:  Supple, no jugular venous distention. No thyroid enlargement, no tenderness.  LUNGS: Normal breath sounds bilaterally, no wheezing, rales, rhonchi. No use of accessory muscles of respiration.  CARDIOVASCULAR: S1, S2 normal. No murmurs, rubs, or gallops.  ABDOMEN: Soft, nontender, nondistended. Bowel sounds present. No organomegaly or mass.  EXTREMITIES: No cyanosis, clubbing or edema b/l.  Right 3rd Toe discoloration but pain or redness noted proximally.   NEUROLOGIC: Cranial nerves II through XII are intact. No focal  Motor or sensory deficits b/l.  Globally weak/Encephalopathic.  PSYCHIATRIC: The patient is alert and oriented x 1.  SKIN: No obvious rash, lesion, or ulcer.    LABORATORY PANEL:   CBC Recent Labs  Lab 10/09/17 0426  WBC 12.2*  HGB 9.8*  HCT 30.3*  PLT 194   ------------------------------------------------------------------------------------------------------------------  Chemistries  Recent Labs  Lab 09/30/2017 1723  10/09/17 0426  NA 134*   < > 142  K 5.1   < > 4.5  CL 105   < > 110  CO2 17*   < > 18*  GLUCOSE 111*   < > 187*  BUN 50*   < > 42*  CREATININE 1.92*   < > 1.92*  CALCIUM 8.8*   < > 8.0*  AST 33  --   --   ALT 15  --   --   ALKPHOS 120  --   --   BILITOT 0.7  --   --    < > = values in this interval not displayed.   ------------------------------------------------------------------------------------------------------------------  Cardiac Enzymes Recent Labs  Lab 09/30/2017 2023  TROPONINI <0.03   ------------------------------------------------------------------------------------------------------------------  RADIOLOGY:  Dg Chest 2 View  Result Date: 09/27/2017 CLINICAL DATA:  84 year old female with vomiting and diarrhea onset today. EXAM: CHEST - 2 VIEW COMPARISON:  Cervical spine CT 03/10/2010. FINDINGS: Seated AP and lateral views of the chest. Lung volumes are at the upper limits of normal to hyperinflated. Chronic calcified biapical pleural scarring redemonstrated. Mild cardiomegaly. Other mediastinal contours are within normal limits. Visualized tracheal air column is within normal limits. No pneumothorax, pulmonary  edema, pleural effusion or confluent pulmonary opacity. Osteopenia. No acute osseous abnormality identified. Negative visible bowel gas pattern. IMPRESSION: No acute cardiopulmonary abnormality. Electronically Signed   By: Odessa FlemingH  Hall M.D.   On: 09/22/2017 20:44   Ct Head Wo Contrast  Result Date: 10/09/2017 CLINICAL DATA:  Altered level  of consciousness. EXAM: CT HEAD WITHOUT CONTRAST TECHNIQUE: Contiguous axial images were obtained from the base of the skull through the vertex without intravenous contrast. COMPARISON:  MRI 10/08/2017, CT 10/11/2017 FINDINGS: Brain: Hypodensity in the right temporoparietal lobe is unchanged from prior studies and most consistent with late subacute infarction. No new area of infarction. Negative for hemorrhage. Mild atrophy. Negative for hydrocephalus. Negative for mass lesion. Vascular: Negative for hyperdense vessel Skull: Negative Sinuses/Orbits: Negative Other: None IMPRESSION: Right temporoparietal infarct unchanged. This is most compatible with late subacute infarct. No new infarct or hemorrhage. Electronically Signed   By: Marlan Palauharles  Clark M.D.   On: 10/09/2017 12:57   Ct Head Wo Contrast  Result Date: 09/20/2017 CLINICAL DATA:  84 y/o  F; unable to swallow. EXAM: CT HEAD WITHOUT CONTRAST TECHNIQUE: Contiguous axial images were obtained from the base of the skull through the vertex without intravenous contrast. COMPARISON:  05/04/2017 CT head. FINDINGS: Brain: Hypoattenuation with loss of gray-white differentiation involving the right perisylvian temporal lobe, insula, and extending into right parietal lobe compatible with acute or subacute infarction, ASPECTS is 6. No hemorrhage or significant mass effect. Stable background of chronic microvascular ischemic changes and parenchymal volume loss of the brain. No extra-axial collection, hydrocephalus, or effacement of basilar cisterns. Vascular: Calcific atherosclerosis of the carotid siphons. Skull: Normal. Negative for fracture or focal lesion. Sinuses/Orbits: No acute finding. Other: None. IMPRESSION: 1. Right MCA distribution acute or subacute infarction involving right temporal lobe, insula, and right parietal lobe. No hemorrhage or significant mass effect. ASPECTS is 6. 2. Stable background of chronic microvascular ischemic changes and parenchymal volume  loss of the brain. These results were called by telephone at the time of interpretation on 10/04/2017 at 8:37 pm to Dr. Ileana RoupJAMES MCSHANE , who verbally acknowledged these results. Electronically Signed   By: Mitzi HansenLance  Furusawa-Stratton M.D.   On: 09/27/2017 20:40   Ct Soft Tissue Neck Wo Contrast  Result Date: 10/04/2017 CLINICAL DATA:  84 y/o  F; unable to swallow. EXAM: CT NECK WITHOUT CONTRAST TECHNIQUE: Multidetector CT imaging of the neck was performed following the standard protocol without intravenous contrast. COMPARISON:  03/10/2010 CT of cervical spine. FINDINGS: Pharynx and larynx: Normal. No mass or swelling. Salivary glands: No inflammation, mass, or stone. Thyroid: 7 mm nodule in the right lobe of the thyroid gland. Lymph nodes: None enlarged or abnormal density. Vascular: Calcific atherosclerosis of aortic arch and carotid siphons. Retropharyngeal submucosal course of the left carotid system. Limited intracranial: Negative. Visualized orbits: Bilateral intra-ocular lens replacement. Mastoids and visualized paranasal sinuses: Clear. Skeleton: C4-5, C5-6, and C6-7 grade 1 anterolisthesis. Moderate cervical spondylosis with discogenic degenerative changes greatest at C5-6 and upper cervical facet hypertrophy. No high-grade bony canal stenosis. Upper chest: Calcified pleuroparenchymal scarring of the lung apices. Other: None. IMPRESSION: 1. No mass or inflammatory process of the aerodigestive tract identified. 2. Moderate spondylosis of the cervical spine. 3. Calcific atherosclerosis of carotid siphons and aorta. Electronically Signed   By: Mitzi HansenLance  Furusawa-Stratton M.D.   On: 10/13/2017 20:33   Mr Brain Wo Contrast  Result Date: 10/08/2017 CLINICAL DATA:  Decreased appetite.  Difficulty swallowing. EXAM: MRI HEAD WITHOUT CONTRAST MRA HEAD WITHOUT CONTRAST TECHNIQUE:  Multiplanar, multiecho pulse sequences of the brain and surrounding structures were obtained without intravenous contrast. Angiographic  images of the head were obtained using MRA technique without contrast. COMPARISON:  CT head 05/04/2017.  CT head 10/11/2017. FINDINGS: MRI HEAD FINDINGS Brain: Subacute RIGHT MCA territory infarction involves the superior temporal lobe, insula, posterior frontal lobe, and RIGHT parietal lobe, involving cortex and white matter. Developing encephalomalacia. No significant blood products. Generalized atrophy, not unexpected for age. Moderately advanced T2 and FLAIR hyperintensities throughout the white matter, consistent with small vessel disease. Vascular: Discussed below. Skull and upper cervical spine: Normal for age marrow signal. Sinuses/Orbits: No layering sinus fluid or significant opacity. No acute orbital findings. BILATERAL cataract extraction. Other: Trace LEFT mastoid effusion. MRA HEAD FINDINGS Internal carotid arteries demonstrate no flow-limiting stenosis or occlusion. Basilar artery widely patent with vertebrals codominant. Slight irregularity of RIGHT M1 MCA. High-grade stenosis of the RIGHT inferior M2 MCA branch. Short segment occlusion of the RIGHT M2 superior division. There is corresponding decreased flow related enhancement within the visualized RIGHT MCA M3 branches, with superimposed intracranial atherosclerotic narrowing within the sylvian fissure and beyond. Normal LEFT M1 MCA. Superior LEFT M2 MCA division patent. Inferior LEFT M2 MCA branch high-grade stenosis. Hypoplastic RIGHT A1 ACA, with robust/dominant LEFT A1 ACA. Patent anterior communicating artery and both distal anterior cerebral arteries. High-grade stenosis of both posterior cerebral arteries proximally, tandem LEFT P2 stenosis. Poor flow related enhancement within the visualized distal LEFT PCA. LEFT superior cerebellar artery and RIGHT posterior inferior cerebellar artery are well visualized and unremarkable. Other cerebellar branches poorly visualized. IMPRESSION: Subacute RIGHT MCA territory infarction corresponds with the  CT findings. No evidence of acute ischemia. No superimposed hemorrhage. Atrophy and small vessel disease. Intracranial atherosclerotic change, most notably affecting the superior and inferior M2 RIGHT MCA branches. If further investigation desired, recommend CTA head neck. Electronically Signed   By: Elsie Stain M.D.   On: 10/08/2017 11:33   US Carotid Bilateral (at [facility] And [facility] Only)  Result Date: 10/08/2017 CLINICAL DATA:  Stroke with difficulty swallowing. EXAM: BILATERAL CAROTID DUPLEX ULTRASOUND TECHNIQUE: Wallace Cullens scale imaging, color Doppler and duplex ultrasound were performed of bilateral carotid and vertebral arteries in the neck. COMPARISON:  None. FINDINGS: Criteria: Quantification of carotid stenosis is based on velocity parameters that correlate the residual internal carotid diameter with NASCET-based stenosis levels, using the diameter of the distal internal carotid lumen as the denominator for stenosis measurement. The following velocity measurements were obtained: RIGHT ICA:  52 cm/sec CCA:  64 cm/sec SYSTOLIC ICA/CCA RATIO:  0.8 DIASTOLIC ICA/CCA RATIO:  2.5 ECA:  146 cm/sec LEFT ICA:  85 cm/sec CCA:  76 cm/sec SYSTOLIC ICA/CCA RATIO:  1.1 DIASTOLIC ICA/CCA RATIO:  1.8 ECA:  61 cm/sec RIGHT CAROTID ARTERY: Small amount of echogenic plaque at the carotid bulb and origin of the internal and external carotid arteries. External carotid artery is patent with normal waveform. Normal waveforms and velocities in the internal carotid artery. RIGHT VERTEBRAL ARTERY: Antegrade flow and normal waveform in the right vertebral artery. LEFT CAROTID ARTERY: Echogenic plaque at the left carotid bulb and origin of the internal carotid artery. External carotid artery is patent with normal waveform. Normal waveforms and velocities in the internal carotid artery. LEFT VERTEBRAL ARTERY: Antegrade flow and normal waveform in the left vertebral artery. IMPRESSION: Mild atherosclerotic disease in the carotid arteries.  Plaque is predominantly at the carotid bulbs and bifurcations. Estimated degree of stenosis in the internal carotid arteries is less than 50% bilaterally.  Patent vertebral arteries with antegrade flow. Electronically Signed   By: Richarda Overlie M.D.   On: 10/08/2017 11:17   Mr Maxine Glenn Head/brain ZO Cm  Result Date: 10/08/2017 CLINICAL DATA:  Decreased appetite.  Difficulty swallowing. EXAM: MRI HEAD WITHOUT CONTRAST MRA HEAD WITHOUT CONTRAST TECHNIQUE: Multiplanar, multiecho pulse sequences of the brain and surrounding structures were obtained without intravenous contrast. Angiographic images of the head were obtained using MRA technique without contrast. COMPARISON:  CT head 05/04/2017.  CT head 10/14/2017. FINDINGS: MRI HEAD FINDINGS Brain: Subacute RIGHT MCA territory infarction involves the superior temporal lobe, insula, posterior frontal lobe, and RIGHT parietal lobe, involving cortex and white matter. Developing encephalomalacia. No significant blood products. Generalized atrophy, not unexpected for age. Moderately advanced T2 and FLAIR hyperintensities throughout the white matter, consistent with small vessel disease. Vascular: Discussed below. Skull and upper cervical spine: Normal for age marrow signal. Sinuses/Orbits: No layering sinus fluid or significant opacity. No acute orbital findings. BILATERAL cataract extraction. Other: Trace LEFT mastoid effusion. MRA HEAD FINDINGS Internal carotid arteries demonstrate no flow-limiting stenosis or occlusion. Basilar artery widely patent with vertebrals codominant. Slight irregularity of RIGHT M1 MCA. High-grade stenosis of the RIGHT inferior M2 MCA branch. Short segment occlusion of the RIGHT M2 superior division. There is corresponding decreased flow related enhancement within the visualized RIGHT MCA M3 branches, with superimposed intracranial atherosclerotic narrowing within the sylvian fissure and beyond. Normal LEFT M1 MCA. Superior LEFT M2 MCA division patent.  Inferior LEFT M2 MCA branch high-grade stenosis. Hypoplastic RIGHT A1 ACA, with robust/dominant LEFT A1 ACA. Patent anterior communicating artery and both distal anterior cerebral arteries. High-grade stenosis of both posterior cerebral arteries proximally, tandem LEFT P2 stenosis. Poor flow related enhancement within the visualized distal LEFT PCA. LEFT superior cerebellar artery and RIGHT posterior inferior cerebellar artery are well visualized and unremarkable. Other cerebellar branches poorly visualized. IMPRESSION: Subacute RIGHT MCA territory infarction corresponds with the CT findings. No evidence of acute ischemia. No superimposed hemorrhage. Atrophy and small vessel disease. Intracranial atherosclerotic change, most notably affecting the superior and inferior M2 RIGHT MCA branches. If further investigation desired, recommend CTA head neck. Electronically Signed   By: Elsie Stain M.D.   On: 10/08/2017 11:33     ASSESSMENT AND PLAN:   84 year old female with past medical history of dementia, chronic kidney disease stage III, GERD, gout, hypertension, osteoarthritis who presented to the hospital due to weakness, lethargy and difficulty swallowing and noted to have an acute CVA.  1.  Acute CVA-this is the cause of patient's weakness lethargy and difficulty swallowing.   -Patient had a MRI of the brain which was consistent with a right MCA distribution stroke.  Patient continues to be lethargic/altered and did not pass her speech eval.  Continue aspirin per rectum for now. -Echocardiogram showing no cardiac source of emboli.  EF was normal.  Carotid duplex also negative for carotid artery stenosis.  Await speech, occupational therapy evaluation. -Appreciate neurology consult.  2.  Atrial fibrillation with rapid ventricular response-patient having some variable heart rates this morning.  Given pulse doses of IV Cardizem and has improved. -We will place on some IV metoprolol as needed.  Follow  heart rate.  When patient can take p.o. we will resume some oral meds.  3. Acute on chronic renal failure-continue gentle IV fluids, follow BUN and creatinine and urine output.  Renal dose meds, avoid nephrotoxins.  4. Essential hypertension- pt. Cannot take PO and cont. IV Metoprolol, Labetalol PRN.  - will resume  PO meds when she can take PO.   5.  History of gout- no acute attack. Resume allopurinol when taking p.o.  5.  Dementia with behavioral disturbance- will resume Aricept, Seroquel when taking PO.   6. Hx of Right Toe Osteo - cont. Doxy.   Patient's prognosis poor given her dementia and now with acute stroke/feeling swallow eval.  Will get palliative care consult to discuss goals of care.  Patient is already a DNR.  Patient may benefit from hospice services.  All the records are reviewed and case discussed with Care Management/Social Worker. Management plans discussed with the patient, family and they are in agreement.  CODE STATUS: DNR  DVT Prophylaxis: Heparin sub Q  TOTAL TIME TAKING CARE OF THIS PATIENT: 30 minutes.   POSSIBLE D/C IN 1-2 DAYS, DEPENDING ON CLINICAL CONDITION.   Houston Siren M.D on 10/09/2017 at 1:53 PM  Between 7am to 6pm - Pager - [phone]  After 6pm go to [email or web address] - Social research officer, government  [facility]  Office  [phone]  CC: Primary care physician; Jaclyn Shaggy, MD

## 2017-10-09 NOTE — Consult Note (Signed)
Consultation Note Date: 10/09/2017   Patient Name: [name]  DOB: Oct 26, 1920  MRN: [id number]  Age / Sex: 84 y.o., female  PCP: Albina Billet, MD Referring Physician: Henreitta Leber, MD  Reason for Consultation: Establishing goals of care  HPI/Patient Profile: 84 y.o. female  admitted on 10/06/2017 with decreased appetite, difficulty swallowing, and weakness. She has a past medical history of GERD, Gout, HTN, osteoarthritis, dementia, stage 3 chronic kidney disease, and right foot osteomyelitis. In the ED she was found on CT imaging to have a significant right-sided stroke.  Patient is unable to contribute significant information to her HPI, although she is able to follow most simple commands.  She is from a [facility] similar to group home. Her niece reports she has a history of low literacy and has always been "slow," which is why she resided at the home facility.   Clinical Assessment and Goals of Care: I have reviewed medical records including MAR, lab results, progress notes, and imaging, received report from bedside RN, and assessed the patient. Patient responds with small fragmented sentences. She is alert to self only. Due to her dementia and decline in health status patient is unable to participate in goals of care conversation. Elmo Putt, NP and myself met at the bedside along with [facility]) and other family member to discuss diagnosis prognosis, [place], EOL wishes, disposition and options.  I introduced Palliative Medicine as specialized medical care for people living with serious illness. It focuses on providing relief from the symptoms and stress of a serious illness. The goal is to improve quality of life for both the patient and the family.  We discussed a brief life review of the patient. The family reports Ms. Lartigue being a very independent person who loved to help others. At the  home facility she would be assigned jobs to allow her the independency she was used to while helping others. She would complete daily task like prepping tables for meals and making sure all bathrooms were stocked with toilet tissue. She loved to socialize with the other residents. She loved holidays such as Christmas and Thanksgiving. Her family stated she would spend time passing out Christmas cards to all of her friends and relatives. Often they would find $5 in their cards because this was the way she could give to them.   As far as functional and nutritional status family reports that last summer Ms. Rentz was hospitalized and at that point the family thought she then had a stroke. It was not confirmed however, she began to decline. They reported she began to have complications with swallowing, she was not as active as they had known her to be, became weaker, and no longer showed interest in the things she loved such as holidays or socializing with others. Family reports she has lost over 20lbs over the past several months due to decrease in appetite.   We discussed her current illness and what it means in the larger context of her on-going co-morbidities.  Natural disease trajectory and expectations at EOL were discussed.  I attempted to elicit values and goals of care important to the patient. Advanced directives, concepts specific to code status, artifical feeding and hydration, and rehospitalization were considered and discussed. The patient has a documented living will stating she did not wish to undergo any life-substaining measures such as CPR, intubation, artificial feeding, dialysis, etc. If she has a terminal disease, in a vegetative state, or advanced dementia etc. Her niece/POA verbalizes knowledge of her wishes and that her and other family members have had discussions since regarding patient's recent health status as well.   The difference between aggressive medical intervention and comfort  care was considered in light of the patient's goals of care. Hospice and Palliative Care services outpatient were explained.  At this time the Ms. Corbett [facility]) wishes to shift all care to full comfort and is requesting for placement at a local residential hospice facility. She verbalizes the group home would not be able to care for her EOL needs.   Questions and concerns were addressed.  The family was encouraged to call with questions or concerns.  PMT will continue to support holistically.  HCPOA-Andrea Corbett (niece)    SUMMARY OF RECOMMENDATIONS    DNR/DNI  FULL COMFORT CARE: Per niece Education officer, community) family wishes to shift to full comfort care and not proceed with any further aggressive treatments. Family verbalized understanding of current conditions and future outlook.   CSW consult for residential hospice placement at family's request.   Will avoid Morphine due to renal failure. Dilaudid 0.80m every 2 hours IV PRN for pain.   Palliative will continue to support family and medical team during hospitalization.   Code Status/Advance Care Planning:  DNR   Symptom Management:   Dilaudid 0.592mIV PRN for pain/agitation/shortness of breath  Zofran 44m25mV every 6 hours PRN for nausea, vomiting per attending  Scopolamine patch scheduled/Robinul 0.2mg53m PRN for secretions   Haldol/Ativan PRN for agitation/anxiety   Palliative Prophylaxis:   Aspiration, Bowel Regimen, Delirium Protocol, Frequent Pain Assessment, Oral Care and Turn Reposition  Additional Recommendations (Limitations, Scope, Preferences):  Full Comfort Care  Psycho-social/Spiritual:   Desire for further Chaplaincy support:no  Prognosis:   < 2 weeks in the setting of acute CVA, poor po intake, malnutrition, Stage 3 CKD, and advanced dementia  Discharge Planning: Hospice facility per family's request.      Primary Diagnoses: Present on Admission: . Essential hypertension . PAD (peripheral artery  disease) (HCC)[place] (gastroes[place] disease) . Stroke (HCC)[place] (chronic kidney disease), stage IV (HCC)[place]steomyelitis [place] (HCC[place] have reviewed the medical record, interviewed the patient and family, and examined the patient. The following aspects are pertinent.  Past Medical History:  Diagnosis Date  . CKD (chronic kidney disease)   . GERD (gastroesophageal reflux disease)   . Gout   . Hypertension   . Osteoarthritis   . Renal disorder    Social History   Socioeconomic History  . Marital status: Single    Spouse name: Not on file  . Number of children: Not on file  . Years of education: Not on file  . Highest education level: Not on file  Occupational History  . Not on file  Social Needs  . Financial resource strain: Not on file  . Food insecurity:    Worry: Not on file    Inability: Not on file  . Transportation needs:    Medical:  Not on file    Non-medical: Not on file  Tobacco Use  . Smoking status: Never Smoker  . Smokeless tobacco: Never Used  Substance and Sexual Activity  . Alcohol use: No  . Drug use: No  . Sexual activity: Not Currently  Lifestyle  . Physical activity:    Days per week: Not on file    Minutes per session: Not on file  . Stress: Not on file  Relationships  . Social connections:    Talks on phone: Not on file    Gets together: Not on file    Attends religious service: Not on file    Active member of club or organization: Not on file    Attends meetings of clubs or organizations: Not on file    Relationship status: Not on file  Other Topics Concern  . Not on file  Social History Narrative  . Not on file   Family History  Problem Relation Age of Onset  . Hypertension Other   . Hyperlipidemia Other    Scheduled Meds: . aspirin  300 mg Rectal Daily  . heparin  5,000 Units Subcutaneous Q8H  . scopolamine  1 patch Transdermal Q72H   Continuous Infusions: . sodium chloride 50 mL/hr at 10/09/17 1049    . doxycycline (VIBRAMYCIN) IV 100 mg (10/09/17 1143)   PRN Meds:.acetaminophen **OR** acetaminophen (TYLENOL) oral liquid 160 mg/5 mL **OR** acetaminophen, haloperidol lactate, labetalol, metoprolol tartrate, ondansetron (ZOFRAN) IV Medications Prior to Admission:  Prior to Admission medications   Medication Sig Start Date End Date Taking? Authorizing Provider  allopurinol (ZYLOPRIM) 100 MG tablet Take 100 mg by mouth daily. (0800)   Yes [provider]  aspirin 81 MG chewable tablet Chew 81 mg by mouth daily. (0800)   Yes [provider]  cloNIDine (CATAPRES) 0.2 MG tablet Take 0.2 mg by mouth 3 (three) times daily. (0800, 1400, & 2000)   Yes [provider]  clopidogrel (PLAVIX) 75 MG tablet Take 1 tablet (75 mg total) by mouth daily. 09/25/17  Yes Schnier, Dolores Lory, MD  donepezil (ARICEPT) 5 MG tablet Take 5 mg by mouth every evening. (2000)   Yes [provider]  doxycycline (VIBRAMYCIN) 100 MG capsule Take 100 mg by mouth 2 (two) times daily. For 28 days 09/22/16 10/19/17 Yes [provider]  felodipine (PLENDIL) 10 MG 24 hr tablet Take 10 mg by mouth daily. (0800)   Yes [provider]  metoprolol (LOPRESSOR) 100 MG tablet Take 100 mg by mouth 2 (two) times daily. (0800 & 2000)   Yes [provider]  QUEtiapine (SEROQUEL) 25 MG tablet Take 25 mg by mouth 2 (two) times daily. (0800 & 2000)   Yes [provider]   No Known Allergies Review of Systems  Unable to perform ROS: Acuity of condition    Physical Exam  Constitutional: She appears lethargic. She appears cachectic. She has a sickly appearance.  Musculoskeletal:  Generalized weakness   Neurological: She appears lethargic. She is disoriented. She displays atrophy.  Skin: Skin is warm and dry.  Psychiatric: Cognition and memory are impaired.  Advanced dementia   Nursing note and vitals reviewed.   Vital Signs: BP (!) 130/46   Pulse (!) 123   Temp (!) 97.4  F (36.3 C) (Oral)   Resp (!) 22   Ht 4' 8"  (1.422 m)   Wt 39.5 kg (87 lb) Comment: bed scale  SpO2 100%   BMI 19.51 kg/m  Pain Scale: Faces  Pain Score: 0-No pain   SpO2: SpO2: 100 % O2 Device:SpO2: 100 % O2 Flow Rate: .   IO: Intake/output summary:   Intake/Output Summary (Last 24 hours) at 10/09/2017 1527 Last data filed at 10/09/2017 0900 Gross per 24 hour  Intake 1418.83 ml  Output -  Net 1418.83 ml    LBM: Last BM Date: 10/08/17 Baseline Weight: Weight: 46.7 kg (103 lb) Most recent weight: Weight: 39.5 kg (87 lb)(bed scale)     Palliative Assessment/Data:PPS 20%    Time In: 1400 Time Out: 1515 Time Total: 75 [name].   Plan discussed with Dr. Verdell Carmine, bedside RN, family, CMRN.   Greater than 50%  of this time was spent counseling and coordinating care related to the above assessment and plan.  Signed by:  Alda Lea, NP-BC Palliative Medicine Team  Phone: [phone] Fax: [phone]  Vinie Sill, NP Palliative Medicine Team Pager # [phone] (M-F 8a-5p) Team Phone # [phone] (Nights/Weekends)   Please contact Palliative Medicine Team phone at [phone] for questions and concerns.  For individual provider: See Shea Evans

## 2017-10-09 NOTE — Progress Notes (Signed)
pts hr trended back to high 130s  Pt resting quietly with intevals where she will become restless but settles back down.dr Cherlynn Kaisersainani called  To notify of increased hr again and to let him know pts poa  Was in room.  Dr Thad Rangerreynolds saw pt and spkoe with pts poa.  Dr Cherlynn Kaisersainani  Placed order for palliative care  Earlier.  pts caregiver came to see pt. md ordered  More caridiac med metoprolol iv for  Pt which was given and hr came down to 123.  Hr now back in low 140s. Dr Thad Rangerreynolds ordered ct head repeat.

## 2017-10-09 NOTE — Progress Notes (Signed)
Subjective: Patient with development of afib overnight.  Mental status altered as well and patient now agitated and less conversant.    Objective: Current vital signs: BP (!) 130/46   Pulse (!) 123   Temp (!) 97.4 F (36.3 C) (Oral)   Resp (!) 22   Ht 4\' 8"  (1.422 m)   Wt 39.5 kg (87 lb) Comment: bed scale  SpO2 100%   BMI 19.51 kg/m  Vital signs in last 24 hours: Temp:  [97.4 F (36.3 C)-98.4 F (36.9 C)] 97.4 F (36.3 C) (03/26 1046) Pulse Rate:  [85-132] 123 (03/26 1053) Resp:  [16-30] 22 (03/26 1046) BP: (127-219)/(46-97) 130/46 (03/26 1046) SpO2:  [98 %-100 %] 100 % (03/26 1053) Weight:  [39.5 kg (87 lb)] 39.5 kg (87 lb) (03/25 1647)  Intake/Output from previous day: 03/25 0701 - 03/26 0700 In: 1668.8 [I.V.:1168.8; IV Piggyback:500] Out: -  Intake/Output this shift: No intake/output data recorded. Nutritional status: Fall precautions Diet clear liquid Room service appropriate? Yes; Fluid consistency: Thin  Neurologic Exam: Mental Status: Laying on side eyes closed.  Moans when stimulated.  Speech unable to be understood.  Follows simple commands.    Cranial Nerves: II: Pupils reactive III,IV, VI: does not cooperate V,VII: does not cooperate VIII: hearing normal bilaterally IX,X: gag reflex present XI: does not cooperate XII: midline tongue extension Motor: Moves all extremities Sensory: Responds to light tactile stimuli throughout   Lab Results: Basic Metabolic Panel: Recent Labs  Lab 09/22/2017 1723 10/08/17 0403 10/09/17 0426  NA 134* 138 142  K 5.1 4.5 4.5  CL 105 108 110  CO2 17* 20* 18*  GLUCOSE 111* 89 187*  BUN 50* 45* 42*  CREATININE 1.92* 1.94* 1.92*  CALCIUM 8.8* 8.4* 8.0*    Liver Function Tests: Recent Labs  Lab 09/29/2017 1723  AST 33  ALT 15  ALKPHOS 120  BILITOT 0.7  PROT 7.2  ALBUMIN 3.4*   No results for input(s): LIPASE, AMYLASE in the last 168 hours. No results for input(s): AMMONIA in the last 168  hours.  CBC: Recent Labs  Lab 10/08/2017 1723 10/09/17 0426  WBC 12.5* 12.2*  HGB 12.4 9.8*  HCT 38.9 30.3*  MCV 80.4 80.4  PLT 249 194    Cardiac Enzymes: Recent Labs  Lab 09/16/2017 30-Nov-2021  TROPONINI <0.03    Lipid Panel: Recent Labs  Lab 10/08/17 0403  CHOL 84  TRIG 46  HDL 47  CHOLHDL 1.8  VLDL 9  LDLCALC 28    CBG: No results for input(s): GLUCAP in the last 168 hours.  Microbiology: Results for orders placed or performed during the hospital encounter of 10/13/2017  MRSA PCR Screening     Status: None   Collection Time: 10/11/2017 10:52 PM  Result Value Ref Range Status   MRSA by PCR NEGATIVE NEGATIVE Final    Comment:        The GeneXpert MRSA Assay (FDA approved for NASAL specimens only), is one component of a comprehensive MRSA colonization surveillance program. It is not intended to diagnose MRSA infection nor to guide or monitor treatment for MRSA infections. Performed at [facility], [address] [place], [place] [address]     Coagulation Studies: Recent Labs    10/09/2017 12-01-39  LABPROT 13.0  INR 0.99    Imaging: Dg Chest 2 View  Result Date: 10/05/2017 CLINICAL DATA:  84 year old female with vomiting and diarrhea onset today. EXAM: CHEST - 2 VIEW COMPARISON:  Cervical spine CT 03/10/2010. FINDINGS: Seated  AP and lateral views of the chest. Lung volumes are at the upper limits of normal to hyperinflated. Chronic calcified biapical pleural scarring redemonstrated. Mild cardiomegaly. Other mediastinal contours are within normal limits. Visualized tracheal air column is within normal limits. No pneumothorax, pulmonary edema, pleural effusion or confluent pulmonary opacity. Osteopenia. No acute osseous abnormality identified. Negative visible bowel gas pattern. IMPRESSION: No acute cardiopulmonary abnormality. Electronically Signed   By: Odessa Fleming M.D.   On: 10-18-2017 20:44   Ct Head Wo Contrast  Result Date: 10/09/2017 CLINICAL DATA:   Altered level of consciousness. EXAM: CT HEAD WITHOUT CONTRAST TECHNIQUE: Contiguous axial images were obtained from the base of the skull through the vertex without intravenous contrast. COMPARISON:  MRI 10/08/2017, CT 2017/10/18 FINDINGS: Brain: Hypodensity in the right temporoparietal lobe is unchanged from prior studies and most consistent with late subacute infarction. No new area of infarction. Negative for hemorrhage. Mild atrophy. Negative for hydrocephalus. Negative for mass lesion. Vascular: Negative for hyperdense vessel Skull: Negative Sinuses/Orbits: Negative Other: None IMPRESSION: Right temporoparietal infarct unchanged. This is most compatible with late subacute infarct. No new infarct or hemorrhage. Electronically Signed   By: Marlan Palau M.D.   On: 10/09/2017 12:57   Ct Head Wo Contrast  Result Date: 10-18-2017 CLINICAL DATA:  84 y/o  F; unable to swallow. EXAM: CT HEAD WITHOUT CONTRAST TECHNIQUE: Contiguous axial images were obtained from the base of the skull through the vertex without intravenous contrast. COMPARISON:  05/04/2017 CT head. FINDINGS: Brain: Hypoattenuation with loss of gray-white differentiation involving the right perisylvian temporal lobe, insula, and extending into right parietal lobe compatible with acute or subacute infarction, ASPECTS is 6. No hemorrhage or significant mass effect. Stable background of chronic microvascular ischemic changes and parenchymal volume loss of the brain. No extra-axial collection, hydrocephalus, or effacement of basilar cisterns. Vascular: Calcific atherosclerosis of the carotid siphons. Skull: Normal. Negative for fracture or focal lesion. Sinuses/Orbits: No acute finding. Other: None. IMPRESSION: 1. Right MCA distribution acute or subacute infarction involving right temporal lobe, insula, and right parietal lobe. No hemorrhage or significant mass effect. ASPECTS is 6. 2. Stable background of chronic microvascular ischemic changes and  parenchymal volume loss of the brain. These results were called by telephone at the time of interpretation on 2017-10-18 at 8:37 pm to Dr. Ileana Roup , who verbally acknowledged these results. Electronically Signed   By: Mitzi Hansen M.D.   On: 10-18-17 20:40   Ct Soft Tissue Neck Wo Contrast  Result Date: 2017-10-18 CLINICAL DATA:  84 y/o  F; unable to swallow. EXAM: CT NECK WITHOUT CONTRAST TECHNIQUE: Multidetector CT imaging of the neck was performed following the standard protocol without intravenous contrast. COMPARISON:  03/10/2010 CT of cervical spine. FINDINGS: Pharynx and larynx: Normal. No mass or swelling. Salivary glands: No inflammation, mass, or stone. Thyroid: 7 mm nodule in the right lobe of the thyroid gland. Lymph nodes: None enlarged or abnormal density. Vascular: Calcific atherosclerosis of aortic arch and carotid siphons. Retropharyngeal submucosal course of the left carotid system. Limited intracranial: Negative. Visualized orbits: Bilateral intra-ocular lens replacement. Mastoids and visualized paranasal sinuses: Clear. Skeleton: C4-5, C5-6, and C6-7 grade 1 anterolisthesis. Moderate cervical spondylosis with discogenic degenerative changes greatest at C5-6 and upper cervical facet hypertrophy. No high-grade bony canal stenosis. Upper chest: Calcified pleuroparenchymal scarring of the lung apices. Other: None. IMPRESSION: 1. No mass or inflammatory process of the aerodigestive tract identified. 2. Moderate spondylosis of the cervical spine. 3. Calcific atherosclerosis of carotid siphons  and aorta. Electronically Signed   By: Mitzi Hansen M.D.   On: 10/08/2017 20:33   Mr Brain Wo Contrast  Result Date: 10/08/2017 CLINICAL DATA:  Decreased appetite.  Difficulty swallowing. EXAM: MRI HEAD WITHOUT CONTRAST MRA HEAD WITHOUT CONTRAST TECHNIQUE: Multiplanar, multiecho pulse sequences of the brain and surrounding structures were obtained without intravenous  contrast. Angiographic images of the head were obtained using MRA technique without contrast. COMPARISON:  CT head 05/04/2017.  CT head 08-Oct-2017. FINDINGS: MRI HEAD FINDINGS Brain: Subacute RIGHT MCA territory infarction involves the superior temporal lobe, insula, posterior frontal lobe, and RIGHT parietal lobe, involving cortex and white matter. Developing encephalomalacia. No significant blood products. Generalized atrophy, not unexpected for age. Moderately advanced T2 and FLAIR hyperintensities throughout the white matter, consistent with small vessel disease. Vascular: Discussed below. Skull and upper cervical spine: Normal for age marrow signal. Sinuses/Orbits: No layering sinus fluid or significant opacity. No acute orbital findings. BILATERAL cataract extraction. Other: Trace LEFT mastoid effusion. MRA HEAD FINDINGS Internal carotid arteries demonstrate no flow-limiting stenosis or occlusion. Basilar artery widely patent with vertebrals codominant. Slight irregularity of RIGHT M1 MCA. High-grade stenosis of the RIGHT inferior M2 MCA branch. Short segment occlusion of the RIGHT M2 superior division. There is corresponding decreased flow related enhancement within the visualized RIGHT MCA M3 branches, with superimposed intracranial atherosclerotic narrowing within the sylvian fissure and beyond. Normal LEFT M1 MCA. Superior LEFT M2 MCA division patent. Inferior LEFT M2 MCA branch high-grade stenosis. Hypoplastic RIGHT A1 ACA, with robust/dominant LEFT A1 ACA. Patent anterior communicating artery and both distal anterior cerebral arteries. High-grade stenosis of both posterior cerebral arteries proximally, tandem LEFT P2 stenosis. Poor flow related enhancement within the visualized distal LEFT PCA. LEFT superior cerebellar artery and RIGHT posterior inferior cerebellar artery are well visualized and unremarkable. Other cerebellar branches poorly visualized. IMPRESSION: Subacute RIGHT MCA territory infarction  corresponds with the CT findings. No evidence of acute ischemia. No superimposed hemorrhage. Atrophy and small vessel disease. Intracranial atherosclerotic change, most notably affecting the superior and inferior M2 RIGHT MCA branches. If further investigation desired, recommend CTA head neck. Electronically Signed   By: Elsie Stain M.D.   On: 10/08/2017 11:33   US Carotid Bilateral (at [facility] And [facility] Only)  Result Date: 10/08/2017 CLINICAL DATA:  Stroke with difficulty swallowing. EXAM: BILATERAL CAROTID DUPLEX ULTRASOUND TECHNIQUE: Wallace Cullens scale imaging, color Doppler and duplex ultrasound were performed of bilateral carotid and vertebral arteries in the neck. COMPARISON:  None. FINDINGS: Criteria: Quantification of carotid stenosis is based on velocity parameters that correlate the residual internal carotid diameter with NASCET-based stenosis levels, using the diameter of the distal internal carotid lumen as the denominator for stenosis measurement. The following velocity measurements were obtained: RIGHT ICA:  52 cm/sec CCA:  64 cm/sec SYSTOLIC ICA/CCA RATIO:  0.8 DIASTOLIC ICA/CCA RATIO:  2.5 ECA:  146 cm/sec LEFT ICA:  85 cm/sec CCA:  76 cm/sec SYSTOLIC ICA/CCA RATIO:  1.1 DIASTOLIC ICA/CCA RATIO:  1.8 ECA:  61 cm/sec RIGHT CAROTID ARTERY: Small amount of echogenic plaque at the carotid bulb and origin of the internal and external carotid arteries. External carotid artery is patent with normal waveform. Normal waveforms and velocities in the internal carotid artery. RIGHT VERTEBRAL ARTERY: Antegrade flow and normal waveform in the right vertebral artery. LEFT CAROTID ARTERY: Echogenic plaque at the left carotid bulb and origin of the internal carotid artery. External carotid artery is patent with normal waveform. Normal waveforms and velocities in the internal carotid artery. LEFT  VERTEBRAL ARTERY: Antegrade flow and normal waveform in the left vertebral artery. IMPRESSION: Mild atherosclerotic disease in the  carotid arteries. Plaque is predominantly at the carotid bulbs and bifurcations. Estimated degree of stenosis in the internal carotid arteries is less than 50% bilaterally. Patent vertebral arteries with antegrade flow. Electronically Signed   By: Richarda OverlieAdam  Henn M.D.   On: 10/08/2017 11:17   Mr Maxine GlennMra Head/brain ZOWo Cm  Result Date: 10/08/2017 CLINICAL DATA:  Decreased appetite.  Difficulty swallowing. EXAM: MRI HEAD WITHOUT CONTRAST MRA HEAD WITHOUT CONTRAST TECHNIQUE: Multiplanar, multiecho pulse sequences of the brain and surrounding structures were obtained without intravenous contrast. Angiographic images of the head were obtained using MRA technique without contrast. COMPARISON:  CT head 05/04/2017.  CT head 09/25/2017. FINDINGS: MRI HEAD FINDINGS Brain: Subacute RIGHT MCA territory infarction involves the superior temporal lobe, insula, posterior frontal lobe, and RIGHT parietal lobe, involving cortex and white matter. Developing encephalomalacia. No significant blood products. Generalized atrophy, not unexpected for age. Moderately advanced T2 and FLAIR hyperintensities throughout the white matter, consistent with small vessel disease. Vascular: Discussed below. Skull and upper cervical spine: Normal for age marrow signal. Sinuses/Orbits: No layering sinus fluid or significant opacity. No acute orbital findings. BILATERAL cataract extraction. Other: Trace LEFT mastoid effusion. MRA HEAD FINDINGS Internal carotid arteries demonstrate no flow-limiting stenosis or occlusion. Basilar artery widely patent with vertebrals codominant. Slight irregularity of RIGHT M1 MCA. High-grade stenosis of the RIGHT inferior M2 MCA branch. Short segment occlusion of the RIGHT M2 superior division. There is corresponding decreased flow related enhancement within the visualized RIGHT MCA M3 branches, with superimposed intracranial atherosclerotic narrowing within the sylvian fissure and beyond. Normal LEFT M1 MCA. Superior LEFT M2  MCA division patent. Inferior LEFT M2 MCA branch high-grade stenosis. Hypoplastic RIGHT A1 ACA, with robust/dominant LEFT A1 ACA. Patent anterior communicating artery and both distal anterior cerebral arteries. High-grade stenosis of both posterior cerebral arteries proximally, tandem LEFT P2 stenosis. Poor flow related enhancement within the visualized distal LEFT PCA. LEFT superior cerebellar artery and RIGHT posterior inferior cerebellar artery are well visualized and unremarkable. Other cerebellar branches poorly visualized. IMPRESSION: Subacute RIGHT MCA territory infarction corresponds with the CT findings. No evidence of acute ischemia. No superimposed hemorrhage. Atrophy and small vessel disease. Intracranial atherosclerotic change, most notably affecting the superior and inferior M2 RIGHT MCA branches. If further investigation desired, recommend CTA head neck. Electronically Signed   By: Elsie StainJohn T Curnes M.D.   On: 10/08/2017 11:33    Medications:  I have reviewed the patient's current medications. Scheduled: . aspirin  300 mg Rectal Daily  . heparin  5,000 Units Subcutaneous Q8H  . scopolamine  1 patch Transdermal Q72H    Assessment/Plan: Concerned that with change in mental status and development of Afib that patient may have had another ischemic event.  Head CT repeated and shows no new changes.  Discussion had with family about how aggressive they want to be.  Patient not eating.  If to be aggressive with feeding etc., would consider anticoagulation.  Currently no way to administer anticoagulation.  Family is in discussion.     LOS: 2 days   Thana FarrLeslie Madelyne Millikan, MD Neurology [phone] 10/09/2017  2:05 PM

## 2017-10-09 NOTE — Progress Notes (Signed)
Palliative care in to speak with pts niece who is poa. Hr 97-98 now.

## 2017-10-09 NOTE — Progress Notes (Addendum)
Pt  Moaning/groaning and restless.  When asked if  Hurting  Pt nods yes and puts hand to abd. Color pale. Central tele  Called and reported pt in rapid afib/ 155. md notified. Orders given for cardiac med. Which was given hr came down to af 84-85. For about 30-40 mins then started trending back up to teens.

## 2017-10-10 MED ORDER — SODIUM CHLORIDE 0.9% FLUSH: 3.0000 mL | INTRAVENOUS | Status: DC | PRN

## 2017-10-15 NOTE — Care Management (Signed)
Palliative Care representative Yong ChannelAlicia Parker, NP indicated that Niece, Bethann Humblendrea Carbett would like her family member to go to the Hospice Home. Discussed which Hospice Home would niece like her family member to go to. Chose [facility] on [address] Will update Dayna BarkerKaren Robertson, RN in representative for this facility. Gwenette GreetBrenda S Annete Ayuso RN MSN CCM Care Management [phone]

## 2017-10-15 NOTE — Death Summary Note (Signed)
DEATH SUMMARY   Patient Details  Name: [name] MRN: [id number] DOB: 1921-07-15  Admission/Discharge Information   Admit Date:  04-Nov-2017  Date of Death: Date of Death: 07-Nov-2017  Time of Death: Time of Death: 1425  Length of Stay: 3  Referring Physician: Jaclyn Shaggy, MD   Reason(s) for Hospitalization  Altered mental status/acute CVA with underlying dementia.  Diagnoses  Preliminary cause of death:  Secondary Diagnoses (including complications and co-morbidities):  Principal Problem:   Stroke [facility]) Active Problems:   PAD (peripheral artery disease) (HCC)   Essential hypertension   Osteomyelitis of right foot (HCC)   GERD (gastroesophageal reflux disease)   CKD (chronic kidney disease), stage IV (HCC)   Protein-calorie malnutrition, severe   Goals of care, counseling/discussion   Palliative care encounter   Brief Hospital Course (including significant findings, care, treatment, and services provided and events leading to death)  [name] is a 84 y.o. year old female who 84 year old female with past medical history of dementia, chronic kidney disease stage III, GERD, gout, hypertension, osteoarthritis who presented to the hospital due to weakness, lethargy and difficulty swallowing and noted to have an acute CVA.  1.  Acute CVA 2.  Atrial fibrillation with rapid ventricular response 3. Acute on chronic renal failure 4. Essential hypertension 5. Advanced dementia with Failure to thrive 6. Osteomyelitis.   Patient's prognosis was quite poor given her advanced dementia and now with acute stroke with poor p.o. intake.  Palliative care consult was obtained to discuss goals of care.  Patient was already DNR and now under comfort care only. - pt. Was started on Dilaudid as needed, Robinul/scopolamine, Ativan as needed.  Pt. Passed away on the afternoon of Nov 07, 2017. Family/POA was made aware.    Pertinent Labs and Studies  Significant Diagnostic Studies Dg Chest  2 View  Result Date: 11-04-2017 CLINICAL DATA:  84 year old female with vomiting and diarrhea onset today. EXAM: CHEST - 2 VIEW COMPARISON:  Cervical spine CT 03/10/2010. FINDINGS: Seated AP and lateral views of the chest. Lung volumes are at the upper limits of normal to hyperinflated. Chronic calcified biapical pleural scarring redemonstrated. Mild cardiomegaly. Other mediastinal contours are within normal limits. Visualized tracheal air column is within normal limits. No pneumothorax, pulmonary edema, pleural effusion or confluent pulmonary opacity. Osteopenia. No acute osseous abnormality identified. Negative visible bowel gas pattern. IMPRESSION: No acute cardiopulmonary abnormality. Electronically Signed   By: Odessa Fleming M.D.   On: 11-04-2017 20:44   Ct Head Wo Contrast  Result Date: 10/09/2017 CLINICAL DATA:  Altered level of consciousness. EXAM: CT HEAD WITHOUT CONTRAST TECHNIQUE: Contiguous axial images were obtained from the base of the skull through the vertex without intravenous contrast. COMPARISON:  MRI 10/08/2017, CT 11/04/2017 FINDINGS: Brain: Hypodensity in the right temporoparietal lobe is unchanged from prior studies and most consistent with late subacute infarction. No new area of infarction. Negative for hemorrhage. Mild atrophy. Negative for hydrocephalus. Negative for mass lesion. Vascular: Negative for hyperdense vessel Skull: Negative Sinuses/Orbits: Negative Other: None IMPRESSION: Right temporoparietal infarct unchanged. This is most compatible with late subacute infarct. No new infarct or hemorrhage. Electronically Signed   By: Marlan Palau M.D.   On: 10/09/2017 12:57   Ct Head Wo Contrast  Result Date: 2017/11/04 CLINICAL DATA:  84 y/o  F; unable to swallow. EXAM: CT HEAD WITHOUT CONTRAST TECHNIQUE: Contiguous axial images were obtained from the base of the skull through the vertex without intravenous contrast. COMPARISON:  05/04/2017 CT head.  FINDINGS: Brain: Hypoattenuation  with loss of gray-white differentiation involving the right perisylvian temporal lobe, insula, and extending into right parietal lobe compatible with acute or subacute infarction, ASPECTS is 6. No hemorrhage or significant mass effect. Stable background of chronic microvascular ischemic changes and parenchymal volume loss of the brain. No extra-axial collection, hydrocephalus, or effacement of basilar cisterns. Vascular: Calcific atherosclerosis of the carotid siphons. Skull: Normal. Negative for fracture or focal lesion. Sinuses/Orbits: No acute finding. Other: None. IMPRESSION: 1. Right MCA distribution acute or subacute infarction involving right temporal lobe, insula, and right parietal lobe. No hemorrhage or significant mass effect. ASPECTS is 6. 2. Stable background of chronic microvascular ischemic changes and parenchymal volume loss of the brain. These results were called by telephone at the time of interpretation on 10/04/2017 at 8:37 pm to Dr. Ileana RoupJAMES MCSHANE , who verbally acknowledged these results. Electronically Signed   By: Mitzi HansenLance  Furusawa-Stratton M.D.   On: 09/22/2017 20:40   Ct Soft Tissue Neck Wo Contrast  Result Date: 10/05/2017 CLINICAL DATA:  84 y/o  F; unable to swallow. EXAM: CT NECK WITHOUT CONTRAST TECHNIQUE: Multidetector CT imaging of the neck was performed following the standard protocol without intravenous contrast. COMPARISON:  03/10/2010 CT of cervical spine. FINDINGS: Pharynx and larynx: Normal. No mass or swelling. Salivary glands: No inflammation, mass, or stone. Thyroid: 7 mm nodule in the right lobe of the thyroid gland. Lymph nodes: None enlarged or abnormal density. Vascular: Calcific atherosclerosis of aortic arch and carotid siphons. Retropharyngeal submucosal course of the left carotid system. Limited intracranial: Negative. Visualized orbits: Bilateral intra-ocular lens replacement. Mastoids and visualized paranasal sinuses: Clear. Skeleton: C4-5, C5-6, and C6-7 grade 1  anterolisthesis. Moderate cervical spondylosis with discogenic degenerative changes greatest at C5-6 and upper cervical facet hypertrophy. No high-grade bony canal stenosis. Upper chest: Calcified pleuroparenchymal scarring of the lung apices. Other: None. IMPRESSION: 1. No mass or inflammatory process of the aerodigestive tract identified. 2. Moderate spondylosis of the cervical spine. 3. Calcific atherosclerosis of carotid siphons and aorta. Electronically Signed   By: Mitzi HansenLance  Furusawa-Stratton M.D.   On: 10/03/2017 20:33   Mr Brain Wo Contrast  Result Date: 10/08/2017 CLINICAL DATA:  Decreased appetite.  Difficulty swallowing. EXAM: MRI HEAD WITHOUT CONTRAST MRA HEAD WITHOUT CONTRAST TECHNIQUE: Multiplanar, multiecho pulse sequences of the brain and surrounding structures were obtained without intravenous contrast. Angiographic images of the head were obtained using MRA technique without contrast. COMPARISON:  CT head 05/04/2017.  CT head 09/18/2017. FINDINGS: MRI HEAD FINDINGS Brain: Subacute RIGHT MCA territory infarction involves the superior temporal lobe, insula, posterior frontal lobe, and RIGHT parietal lobe, involving cortex and white matter. Developing encephalomalacia. No significant blood products. Generalized atrophy, not unexpected for age. Moderately advanced T2 and FLAIR hyperintensities throughout the white matter, consistent with small vessel disease. Vascular: Discussed below. Skull and upper cervical spine: Normal for age marrow signal. Sinuses/Orbits: No layering sinus fluid or significant opacity. No acute orbital findings. BILATERAL cataract extraction. Other: Trace LEFT mastoid effusion. MRA HEAD FINDINGS Internal carotid arteries demonstrate no flow-limiting stenosis or occlusion. Basilar artery widely patent with vertebrals codominant. Slight irregularity of RIGHT M1 MCA. High-grade stenosis of the RIGHT inferior M2 MCA branch. Short segment occlusion of the RIGHT M2 superior division.  There is corresponding decreased flow related enhancement within the visualized RIGHT MCA M3 branches, with superimposed intracranial atherosclerotic narrowing within the sylvian fissure and beyond. Normal LEFT M1 MCA. Superior LEFT M2 MCA division patent. Inferior LEFT M2 MCA branch high-grade stenosis. Hypoplastic  RIGHT A1 ACA, with robust/dominant LEFT A1 ACA. Patent anterior communicating artery and both distal anterior cerebral arteries. High-grade stenosis of both posterior cerebral arteries proximally, tandem LEFT P2 stenosis. Poor flow related enhancement within the visualized distal LEFT PCA. LEFT superior cerebellar artery and RIGHT posterior inferior cerebellar artery are well visualized and unremarkable. Other cerebellar branches poorly visualized. IMPRESSION: Subacute RIGHT MCA territory infarction corresponds with the CT findings. No evidence of acute ischemia. No superimposed hemorrhage. Atrophy and small vessel disease. Intracranial atherosclerotic change, most notably affecting the superior and inferior M2 RIGHT MCA branches. If further investigation desired, recommend CTA head neck. Electronically Signed   By: Elsie Stain M.D.   On: 10/08/2017 11:33   US Carotid Bilateral (at [facility] Only)  Result Date: 10/08/2017 CLINICAL DATA:  Stroke with difficulty swallowing. EXAM: BILATERAL CAROTID DUPLEX ULTRASOUND TECHNIQUE: Wallace Cullens scale imaging, color Doppler and duplex ultrasound were performed of bilateral carotid and vertebral arteries in the neck. COMPARISON:  None. FINDINGS: Criteria: Quantification of carotid stenosis is based on velocity parameters that correlate the residual internal carotid diameter with NASCET-based stenosis levels, using the diameter of the distal internal carotid lumen as the denominator for stenosis measurement. The following velocity measurements were obtained: RIGHT ICA:  52 cm/sec CCA:  64 cm/sec SYSTOLIC ICA/CCA RATIO:  0.8 DIASTOLIC ICA/CCA RATIO:  2.5 ECA:  146  cm/sec LEFT ICA:  85 cm/sec CCA:  76 cm/sec SYSTOLIC ICA/CCA RATIO:  1.1 DIASTOLIC ICA/CCA RATIO:  1.8 ECA:  61 cm/sec RIGHT CAROTID ARTERY: Small amount of echogenic plaque at the carotid bulb and origin of the internal and external carotid arteries. External carotid artery is patent with normal waveform. Normal waveforms and velocities in the internal carotid artery. RIGHT VERTEBRAL ARTERY: Antegrade flow and normal waveform in the right vertebral artery. LEFT CAROTID ARTERY: Echogenic plaque at the left carotid bulb and origin of the internal carotid artery. External carotid artery is patent with normal waveform. Normal waveforms and velocities in the internal carotid artery. LEFT VERTEBRAL ARTERY: Antegrade flow and normal waveform in the left vertebral artery. IMPRESSION: Mild atherosclerotic disease in the carotid arteries. Plaque is predominantly at the carotid bulbs and bifurcations. Estimated degree of stenosis in the internal carotid arteries is less than 50% bilaterally. Patent vertebral arteries with antegrade flow. Electronically Signed   By: Richarda Overlie M.D.   On: 10/08/2017 11:17   Mr Maxine Glenn Head/brain BJ Cm  Result Date: 10/08/2017 CLINICAL DATA:  Decreased appetite.  Difficulty swallowing. EXAM: MRI HEAD WITHOUT CONTRAST MRA HEAD WITHOUT CONTRAST TECHNIQUE: Multiplanar, multiecho pulse sequences of the brain and surrounding structures were obtained without intravenous contrast. Angiographic images of the head were obtained using MRA technique without contrast. COMPARISON:  CT head 05/04/2017.  CT head 10/14/17. FINDINGS: MRI HEAD FINDINGS Brain: Subacute RIGHT MCA territory infarction involves the superior temporal lobe, insula, posterior frontal lobe, and RIGHT parietal lobe, involving cortex and white matter. Developing encephalomalacia. No significant blood products. Generalized atrophy, not unexpected for age. Moderately advanced T2 and FLAIR hyperintensities throughout the white matter,  consistent with small vessel disease. Vascular: Discussed below. Skull and upper cervical spine: Normal for age marrow signal. Sinuses/Orbits: No layering sinus fluid or significant opacity. No acute orbital findings. BILATERAL cataract extraction. Other: Trace LEFT mastoid effusion. MRA HEAD FINDINGS Internal carotid arteries demonstrate no flow-limiting stenosis or occlusion. Basilar artery widely patent with vertebrals codominant. Slight irregularity of RIGHT M1 MCA. High-grade stenosis of the RIGHT inferior M2 MCA branch. Short segment occlusion of  the RIGHT M2 superior division. There is corresponding decreased flow related enhancement within the visualized RIGHT MCA M3 branches, with superimposed intracranial atherosclerotic narrowing within the sylvian fissure and beyond. Normal LEFT M1 MCA. Superior LEFT M2 MCA division patent. Inferior LEFT M2 MCA branch high-grade stenosis. Hypoplastic RIGHT A1 ACA, with robust/dominant LEFT A1 ACA. Patent anterior communicating artery and both distal anterior cerebral arteries. High-grade stenosis of both posterior cerebral arteries proximally, tandem LEFT P2 stenosis. Poor flow related enhancement within the visualized distal LEFT PCA. LEFT superior cerebellar artery and RIGHT posterior inferior cerebellar artery are well visualized and unremarkable. Other cerebellar branches poorly visualized. IMPRESSION: Subacute RIGHT MCA territory infarction corresponds with the CT findings. No evidence of acute ischemia. No superimposed hemorrhage. Atrophy and small vessel disease. Intracranial atherosclerotic change, most notably affecting the superior and inferior M2 RIGHT MCA branches. If further investigation desired, recommend CTA head neck. Electronically Signed   By: Elsie Stain M.D.   On: 10/08/2017 11:33    Microbiology Recent Results (from the past 240 hour(s))  MRSA PCR Screening     Status: None   Collection Time: 10/17/2017 10:52 PM  Result Value Ref Range Status    MRSA by PCR NEGATIVE NEGATIVE Final    Comment:        The GeneXpert MRSA Assay (FDA approved for NASAL specimens only), is one component of a comprehensive MRSA colonization surveillance program. It is not intended to diagnose MRSA infection nor to guide or monitor treatment for MRSA infections. Performed at [facility], [address] [place], [place] [address]     Lab Basic Metabolic Panel: Recent Labs  Lab 10-17-17 1723 10/08/17 0403 10/09/17 0426  NA 134* 138 142  K 5.1 4.5 4.5  CL 105 108 110  CO2 17* 20* 18*  GLUCOSE 111* 89 187*  BUN 50* 45* 42*  CREATININE 1.92* 1.94* 1.92*  CALCIUM 8.8* 8.4* 8.0*   Liver Function Tests: Recent Labs  Lab October 17, 2017 1723  AST 33  ALT 15  ALKPHOS 120  BILITOT 0.7  PROT 7.2  ALBUMIN 3.4*   No results for input(s): LIPASE, AMYLASE in the last 168 hours. No results for input(s): AMMONIA in the last 168 hours. CBC: Recent Labs  Lab 2017-10-17 1723 10/09/17 0426  WBC 12.5* 12.2*  HGB 12.4 9.8*  HCT 38.9 30.3*  MCV 80.4 80.4  PLT 249 194   Cardiac Enzymes: Recent Labs  Lab 10-17-17 2023  TROPONINI <0.03   Sepsis Labs: Recent Labs  Lab 10/17/17 1723 10/09/17 0426  WBC 12.5* 12.2*    Procedures/Operations  None   Daniyla Pfahler J 09/16/2017, 4:15 PM

## 2017-10-15 NOTE — Clinical Social Work Note (Signed)
CSW was informed that patient's [facility] would [facility], referral made to [facility] and [facility] [place] nurse liason.  [name] KnackEric R. Karlisa [name], MSW, Theresia MajorsLCSWA [phone]  09/25/2017 7:43 AM

## 2017-10-15 NOTE — Progress Notes (Signed)
Daily Progress Note   Patient Name: [name]       Date: 10/05/2017 DOB: 10-27-1920  Age: 84 y.o. MRN#: [id number] Attending Physician: Houston Siren, MD Primary Care Physician: Jaclyn Shaggy, MD Admit Date: 2017-10-26  Reason for Consultation/Follow-up: Establishing goals of care, Non pain symptom management, Pain control and Psychosocial/spiritual support  Subjective: Patient asleep in bed. Appears to be comfortable. No signs of distress. Niece Sue Lush) at the bedside. Niece verbalizes that she had a few episodes of moaning and pain during the night. She feels that the pain medication worked well with resolving her signs of pain/discomfort. She remains unable to tolerate liquids due to coughing and gagging. Nurse at bedside. Niece is aware that patient is high risk for aspiration with po intake or liquids and for her safety it may not be the best option to continue to give her anything unless she truly is able to be alert and swallow. She verbalized understanding and agreed. Niece has spoken with CSW and also verbalized her awareness that at this time [facility] did not have an available bed.   Chart was reviewed and report received from bedside RN, Ree Kida.   Length of Stay: 3  Current Medications: Scheduled Meds:  . scopolamine  1 patch Transdermal Q72H    Continuous Infusions: . sodium chloride 10 mL/hr at 10/09/17 1630  . doxycycline (VIBRAMYCIN) IV Stopped (10/03/2017 0106)    PRN Meds: [DISCONTINUED] acetaminophen **OR** [DISCONTINUED] acetaminophen (TYLENOL) oral liquid 160 mg/5 mL **OR** acetaminophen, antiseptic oral rinse, glycopyrrolate, haloperidol lactate, HYDROmorphone (DILAUDID) injection, labetalol, LORazepam **OR** LORazepam, metoprolol tartrate, ondansetron  (ZOFRAN) IV, polyvinyl alcohol  Physical Exam  Constitutional: Vital signs are normal. She appears cachectic. She is sleeping. She appears ill.  Musculoskeletal:  Generalized weakness   Neurological: She is disoriented.  Hx of dementia   Psychiatric: Cognition and memory are impaired.            Vital Signs: BP 132/60 (BP Location: Left Arm)   Pulse 90   Temp (!) 97.3 F (36.3 C) (Axillary)   Resp 18   Ht 4\' 8"  (1.422 m)   Wt 39.5 kg (87 lb) Comment: bed scale  SpO2 100%   BMI 19.51 kg/m  SpO2: SpO2: 100 % O2 Device: O2 Device: Room Air O2 Flow Rate:  Intake/output summary:   Intake/Output Summary (Last 24 hours) at 23-Mar-2018 0956 Last data filed at 23-Mar-2018 0308 Gross per 24 hour  Intake 1841.17 ml  Output -  Net 1841.17 ml   LBM: Last BM Date: 10/08/17 Baseline Weight: Weight: 46.7 kg (103 lb) Most recent weight: Weight: 39.5 kg (87 lb)(bed scale)       Palliative Assessment/Data: PPS 20%    Flowsheet Rows     Most Recent Value  Intake Tab  Referral Department  Hospitalist  Unit at Time of Referral  Med/Surg Unit  Palliative Care Primary Diagnosis  Neurology  Date Notified  10/09/17  Palliative Care Type  New Palliative care  Reason for referral  Clarify Goals of Care  Date of Admission  09/27/2017  Date first seen by Palliative Care  10/09/17  # of days Palliative referral response time  0 Day(s)  # of days IP prior to Palliative referral  2  Clinical Assessment  Psychosocial & Spiritual Assessment  Palliative Care Outcomes      Patient Active Problem List   Diagnosis Date Noted  . Protein-calorie malnutrition, severe 10/09/2017  . Goals of care, counseling/discussion   . Palliative care encounter   . GERD (gastroesophageal reflux disease) 09/15/2017  . Stroke (HCC) 10/09/2017  . CKD (chronic kidney disease), stage IV (HCC) 09/23/2017  . Osteomyelitis of right foot (HCC) 09/17/2017  . PAD (peripheral artery disease) (HCC) 08/08/2017  . Skin  ulcer of toe of right foot with necrosis of muscle (HCC) 08/08/2017  . Essential hypertension 08/08/2017    Palliative Care Assessment & Plan   Patient Profile: 84 y.o. female  admitted on 10/14/2017 with decreased appetite, difficulty swallowing, and weakness. She has a past medical history of GERD, Gout, HTN, osteoarthritis, dementia, stage 3 chronic kidney disease, and right foot osteomyelitis. In the ED she was found on CT imaging to have a significant right-sided stroke. Patient is unable to contribute significant information to her HPI, although she is able to follow most simple commands. [facility]. Her niece reports she has a history of low literacy and has always been "slow," which is why she resided at the home facility.   Assessment:  Acute CVA  A-fib w. RVR  Acute on chronic renal failure  HTN  Hx of gout  Dementia with behavioral disturbance  Hx of Right toe osteomylitis    Recommendations/Plan:  DNR/DNI  FULL COMFORT CARE: Per niece Youth worker(HCPOA) family wishes to shift to full comfort care and not proceed with any further aggressive treatments. Family verbalized understanding of current conditions and future outlook.   CSW will continue to f/u with team and family regarding residential hospice placement/bed availability    Will avoid Morphine due to renal failure. Dilaudid 0.5mg  every 2 hours IV PRN for pain.   Continue use of Zofran PRN for nausea, Scopolamine patch/Robinul for secretions, and Haldol/Ativan for agitation/anxiety  Palliative will continue to support family and medical team during hospitalization.   Goals of Care and Additional Recommendations:  Limitations on Scope of Treatment: Full Comfort Care  Code Status:    Code Status Orders  (From admission, onward)        Start     Ordered   10/09/17 1613  Do not attempt resuscitation (DNR)  Continuous    Question Answer Comment  In the event of  cardiac or respiratory ARREST Do not call a "code blue"   In the event of cardiac  or respiratory ARREST Do not perform Intubation, CPR, defibrillation or ACLS   In the event of cardiac or respiratory ARREST Use medication by any route, position, wound care, and other measures to relive pain and suffering. May use oxygen, suction and manual treatment of airway obstruction as needed for comfort.      10/09/17 1615    Code Status History    Date Active Date Inactive Code Status Order ID Comments User Context   2017/10/31 2249 10/09/2017 1615 DNR [id number]  Oralia Manis, MD Inpatient   09/25/2017 1200 09/25/2017 1712 DNR [id number]  Renford Dills, MD Inpatient   08/21/2017 1337 08/21/2017 1819 Full Code [id number]  Schnier, Latina Craver, MD Inpatient    Advance Directive Documentation     Most Recent Value  Type of Advance Directive  Healthcare Power of Attorney, Living will  Pre-existing out of facility DNR order (yellow form or pink MOST form)  -  "MOST" Form in Place?  -       Prognosis:   < 2 weeks in the setting of acute CVA, poor po intake, malnutrition, Stage 3 CKD, and advanced dementia  Discharge Planning:  Pending bed placement at [facility].   Care plan was discussed with bedside RN and provider.   Thank you for allowing the Palliative Medicine Team to assist in the care of this patient.   Total Time 25 min.  Prolonged Time Billed  No      Greater than 50%  of this time was spent counseling and coordinating care related to the above assessment and plan.  Athena "Willette Alma, NP-BC Palliative Medicine Team  Phone: [phone] Fax: [phone]  Yong Channel, NP Palliative Medicine Team Pager # [phone] (M-F 8a-5p) Team Phone # [phone] (Nights/Weekends)  Please contact Palliative Medicine Team phone at [phone] for questions and concerns.

## 2017-10-15 NOTE — Care Management Important Message (Signed)
Important Message  Patient Details  Name: [name] MRN: [id number] Date of Birth: Mar 12, 1921   Medicare Important Message Given:  Yes    Gwenette GreetBrenda S Merriel Zinger, RN 04/04/2018, 7:26 AM

## 2017-10-15 NOTE — Progress Notes (Signed)
[facility] - [place] [facility]   PATIENT NAME: Ignacia BayleyKatie Kubly    MR#:  [id number]  DATE OF BIRTH:  09/30/1920  SUBJECTIVE:   Patient here due to altered mental status noted to have an acute stroke.  Patient was not clinically improving therefore palliative care consult was obtained and patient now is under comfort care only.  Patient awaiting hospice home placement.  REVIEW OF SYSTEMS:    Review of Systems  Unable to perform ROS: Dementia    Nutrition: Clear liquids Tolerating Diet: No Tolerating PT: Await Eval.   DRUG ALLERGIES:  No Known Allergies  VITALS:  Blood pressure 132/60, pulse 90, temperature (!) 97.3 F (36.3 C), temperature source Axillary, resp. rate 18, height 4\' 8"  (1.422 m), weight 39.5 kg (87 lb), SpO2 100 %.  PHYSICAL EXAMINATION:   Physical Exam  GENERAL:  84 y.o.-year-old thin patient lying in bed Encephalopathic but not in any distress.  HEENT: Head atraumatic, normocephalic.   NECK:  Supple, no jugular venous distention. No thyroid enlargement, no tenderness.  LUNGS: Normal breath sounds bilaterally, no wheezing, rales, rhonchi. No use of accessory muscles of respiration.  CARDIOVASCULAR: S1, S2 normal. No murmurs, rubs, or gallops.  ABDOMEN: Soft, nontender, nondistended. Bowel sounds present. No organomegaly or mass.  EXTREMITIES: No cyanosis, clubbing or edema b/l.  Right 3rd Toe discoloration but pain or redness noted proximally.   NEUROLOGIC: Sedated/Encephalopathic. Marland Kitchen.  PSYCHIATRIC: Sedated/Encephalopathic.  SKIN: No obvious rash, lesion, or ulcer.    LABORATORY PANEL:   CBC Recent Labs  Lab 10/09/17 0426  WBC 12.2*  HGB 9.8*  HCT 30.3*  PLT 194   ------------------------------------------------------------------------------------------------------------------  Chemistries  Recent Labs  Lab 09/23/2017 1723  10/09/17 0426  NA 134*   < > 142  K 5.1   < > 4.5  CL 105   < > 110  CO2 17*   < > 18*  GLUCOSE 111*    < > 187*  BUN 50*   < > 42*  CREATININE 1.92*   < > 1.92*  CALCIUM 8.8*   < > 8.0*  AST 33  --   --   ALT 15  --   --   ALKPHOS 120  --   --   BILITOT 0.7  --   --    < > = values in this interval not displayed.   ------------------------------------------------------------------------------------------------------------------  Cardiac Enzymes Recent Labs  Lab 09/26/2017 2023  TROPONINI <0.03   ------------------------------------------------------------------------------------------------------------------  RADIOLOGY:  Ct Head Wo Contrast  Result Date: 10/09/2017 CLINICAL DATA:  Altered level of consciousness. EXAM: CT HEAD WITHOUT CONTRAST TECHNIQUE: Contiguous axial images were obtained from the base of the skull through the vertex without intravenous contrast. COMPARISON:  MRI 10/08/2017, CT 09/17/2017 FINDINGS: Brain: Hypodensity in the right temporoparietal lobe is unchanged from prior studies and most consistent with late subacute infarction. No new area of infarction. Negative for hemorrhage. Mild atrophy. Negative for hydrocephalus. Negative for mass lesion. Vascular: Negative for hyperdense vessel Skull: Negative Sinuses/Orbits: Negative Other: None IMPRESSION: Right temporoparietal infarct unchanged. This is most compatible with late subacute infarct. No new infarct or hemorrhage. Electronically Signed   By: Marlan Palauharles  Clark M.D.   On: 10/09/2017 12:57     ASSESSMENT AND PLAN:   84 year old female with past medical history of dementia, chronic kidney disease stage III, GERD, gout, hypertension, osteoarthritis who presented to the hospital due to weakness, lethargy and difficulty swallowing and noted to have an acute CVA.  1.  Acute CVA 2.  Atrial fibrillation with rapid ventricular response 3. Acute on chronic renal failure 4. Essential hypertension 5. Advanced dementia with Failure to thrive 6. Osteomyelitis.   Patient's prognosis was quite poor given her advanced  dementia and now with acute stroke with poor p.o. intake.  Palliative care consult was obtained to discuss goals of care.  Patient is already DNR and now under comfort care only. -Continue Dilaudid as needed, Robinul/scopolamine, Ativan as needed. -Awaiting hospice home placement.  Appreciate palliative care help.  All the records are reviewed and case discussed with Care Management/Social Worker. Management plans discussed with the patient, family and they are in agreement.  CODE STATUS: DNR  DVT Prophylaxis: Heparin sub Q  TOTAL TIME TAKING CARE OF THIS PATIENT: 25 minutes.   POSSIBLE D/C soon when hospice Home bed available.    Houston Siren M.D on 10-15-2017 at 1:45 PM  Between 7am to 6pm - Pager - [phone]  After 6pm go to [email or web address] - Social research officer, government  [facility] [place] [facility]  Office  [phone]  CC: Primary care physician; Jaclyn Shaggy, MD

## 2017-10-15 NOTE — Progress Notes (Signed)
Patient's niece Sue Lushndrea came out of patient's room and reported that patient had 'spit up and wasn't moving". Writer entered the room and patient was not breathing, no pulse detected. Staff RN Ree KidaJack alerted and came to the room. Patient was pronounced at 2:25 pm. Hospice home and referral notified. Palliative NP  Yong ChannelAlicia Parker notified. Staff RN Ree KidaJack notified attending physician Dr. Cherlynn KaiserSainani. Dayna BarkerKaren Robertson RN, BSN, [facility], hospital liaison [phone]

## 2017-10-15 NOTE — Progress Notes (Signed)
10/09/2017  14:25  Pt's great niece notified my by phone that pt had "spit up."  Asked NT Marylene Landngela to change pt gown.  Received call from Clydie BraunKaren with hospice that pt seemed to be deceased.  Pt unresponsive when I arrived in the room. Felt for pulse, breathing, and listened for heart sounds and found none. Had RN Orson Apeanielle Jacobs confirm.  According to orders, pronounced pt dead at 14:25.  Attending MD Sainani notified.  Administrative coordinator Thurston Holenne notified.  Bradly Chrisougherty, Heron Pitcock E, RN

## 2017-10-15 NOTE — Progress Notes (Signed)
New hospice home referral received from [place] [facility] [place] following a  Palliative Medicine consult. Patient is a 84 year old woman with a known history of CKD, GERD, Gout, HTN and osteoarthritis. She was brought to the [facility] ED on 3/24 from her group/care home for evaluation of decreased appetite, difficulty swallowing and . In the ED head CT revealed a significant right sided CVA. Confirmed on MRI. Marland Kitchen Palliative medicine was consulted for goals of care and met with patient's family on 3/26. They chose to focus on comfort and requested transfer to the hospice home.  Writer met in the family room with patient's niece/HCPOA Kerry Fort to initiated education regarding hospice services, philosophy and team approach to care with good understanding voiced. Questions answered. Patient information faxed to referral. Seth Bake and hospital staff aware that patient will not transfer until tomorrow 3/28 due to bed availability. Flo Shanks RN, BSN, [facility] [place], hospital liaison [phone]

## 2017-10-15 DEATH — deceased

## 2017-10-25 ENCOUNTER — Ambulatory Visit (INDEPENDENT_AMBULATORY_CARE_PROVIDER_SITE_OTHER): Payer: Medicare Other | Admitting: Vascular Surgery

## 2017-10-25 ENCOUNTER — Encounter (INDEPENDENT_AMBULATORY_CARE_PROVIDER_SITE_OTHER): Payer: Medicare Other

## 2019-02-23 IMAGING — CT CT HEAD W/O CM
3 series · 16 of 47 positions shown, 19 images · non-contrast
Comparison: 03/10/2010 CT.

CLINICAL DATA: [AGE] hypertensive female with altered mental
status. No fall or loss of consciousness. Initial encounter.

EXAM:
CT HEAD WITHOUT CONTRAST
TECHNIQUE: Contiguous axial images were obtained from the base of the skull
through the vertex without intravenous contrast.

[Series 2: head wo · axial · 0.41mm/px · z∈[-48,+77]mm · 10 of 31 slices shown, 13 images]
[im 3/31  brain]
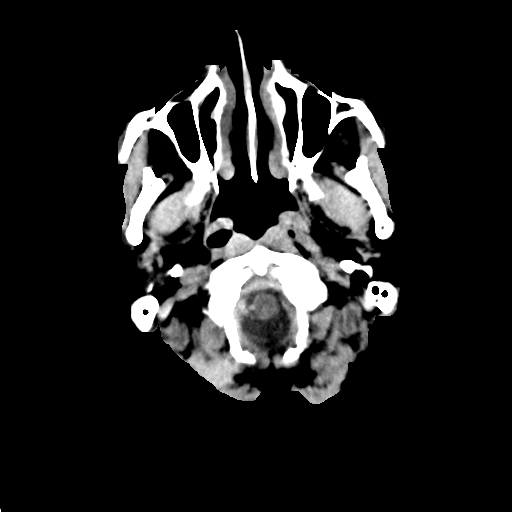
[im 3/31  bone]
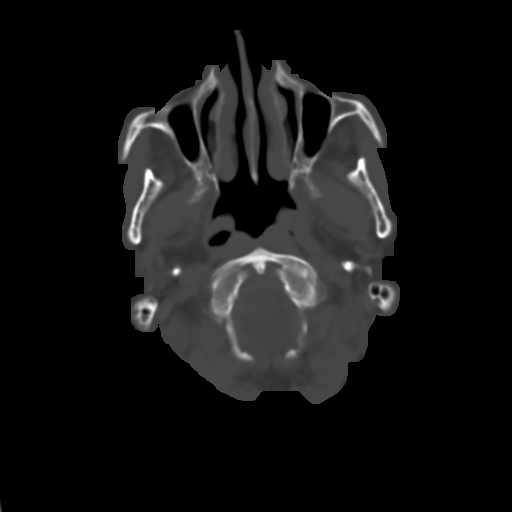
[im 6/31  brain]
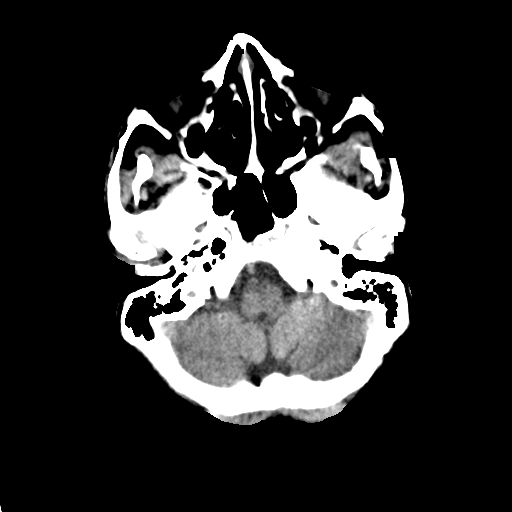
[im 9/31  brain]
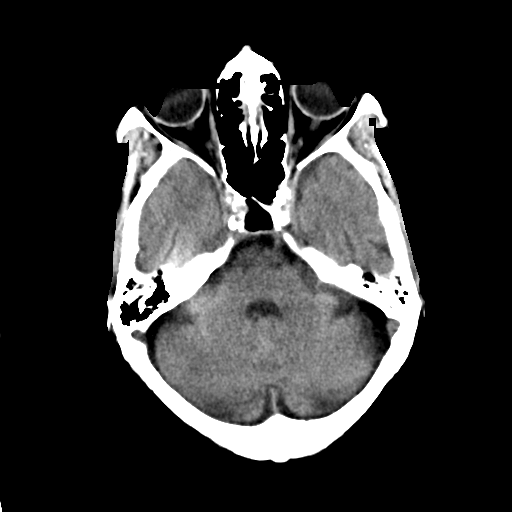
[im 11/31  brain]
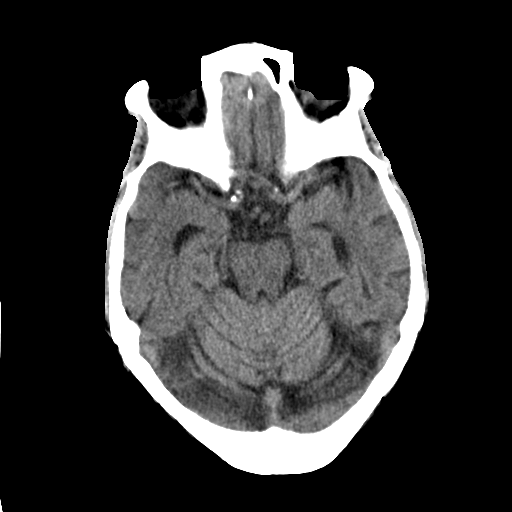
[im 14/31  brain]
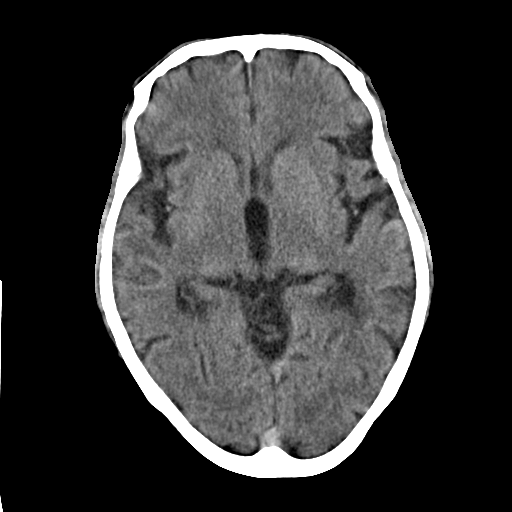
[im 14/31  bone]
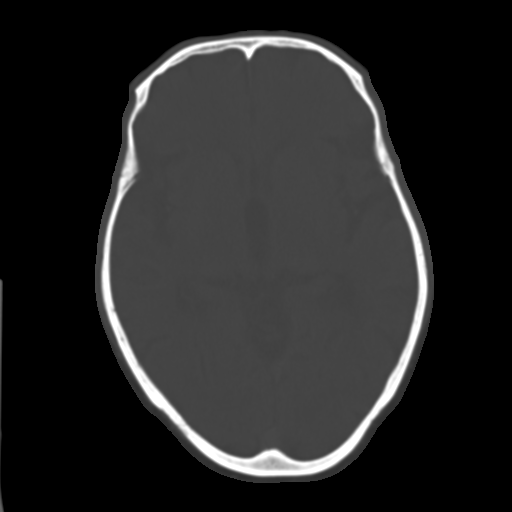
[im 17/31  brain]
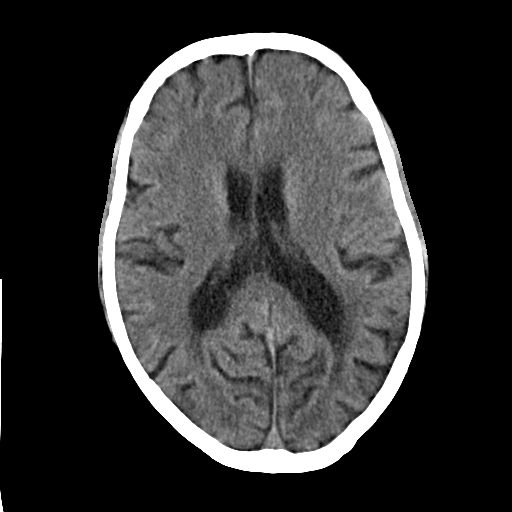
[im 20/31  brain]
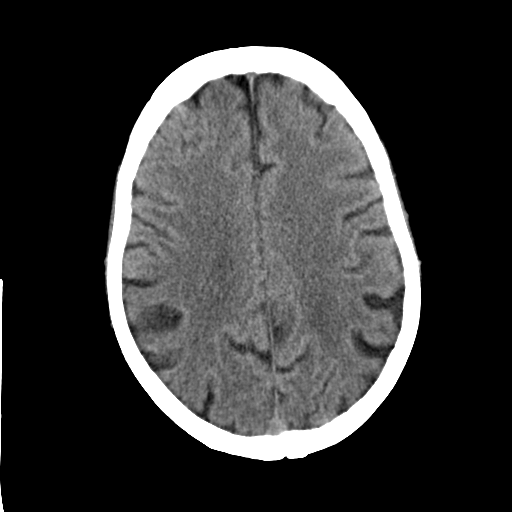
[im 23/31  brain]
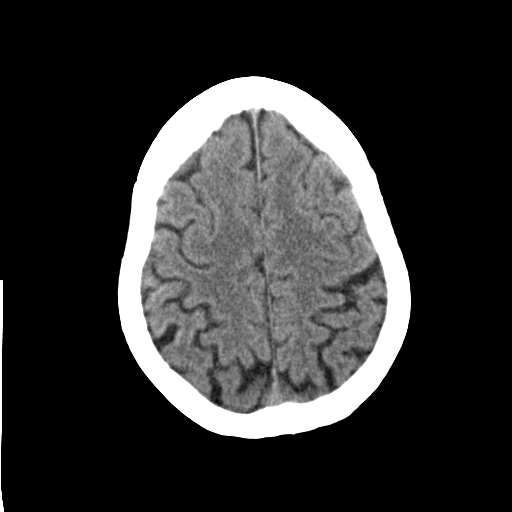
[im 25/31  brain]
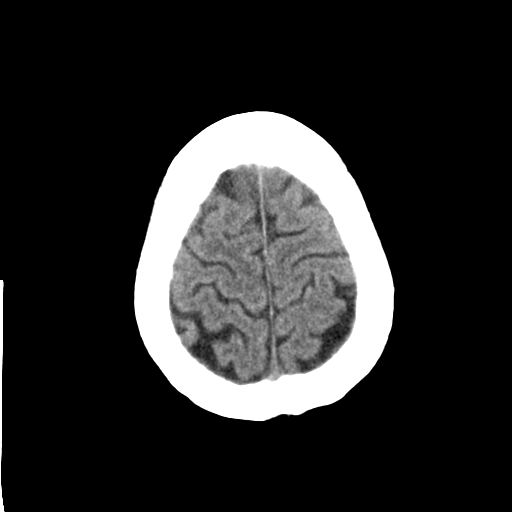
[im 25/31  bone]
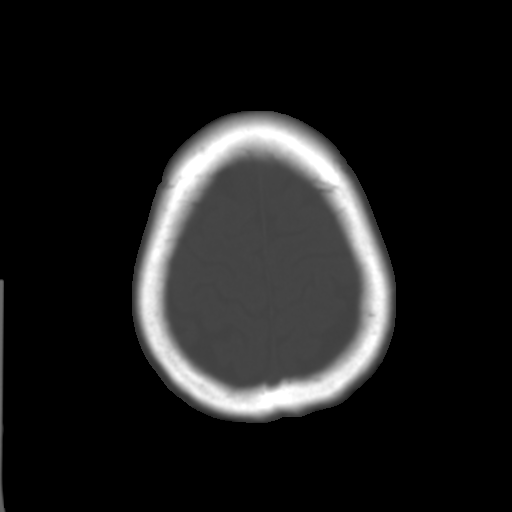
[im 28/31  brain]
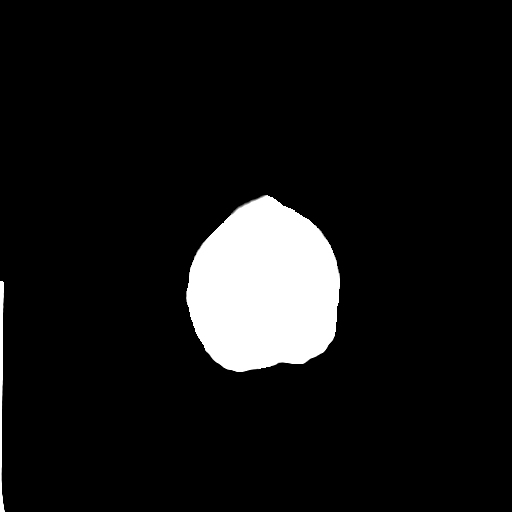

[Series 602: coronal · coronal · 0.41mm/px · 3 of 64 slices shown]
[im 22/64  brain]
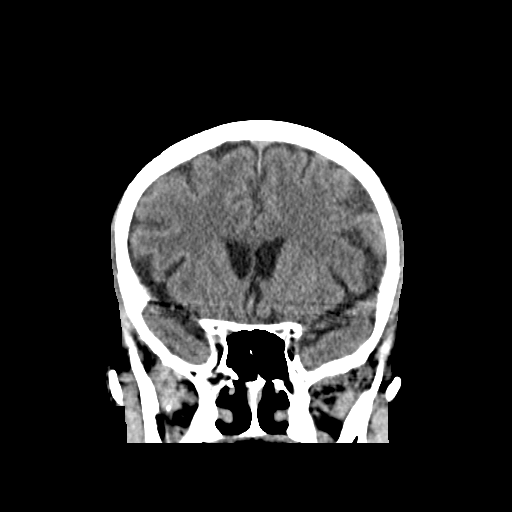
[im 29/64  brain]
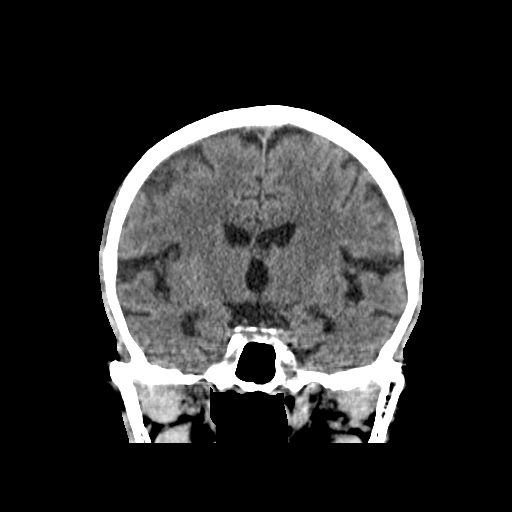
[im 36/64  brain]
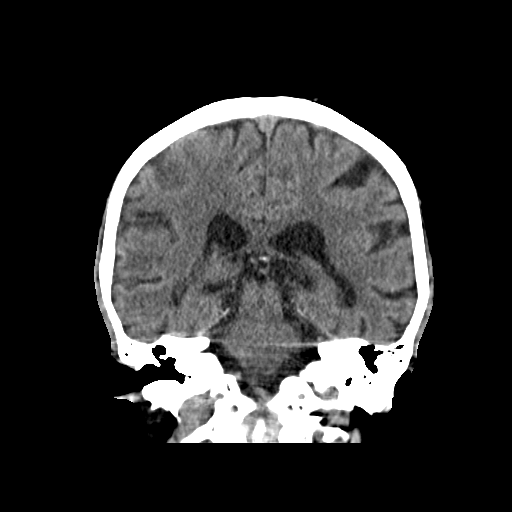

[Series 603: sagittal · sagittal · 0.41mm/px · 3 of 48 slices shown]
[im 16/48  brain]
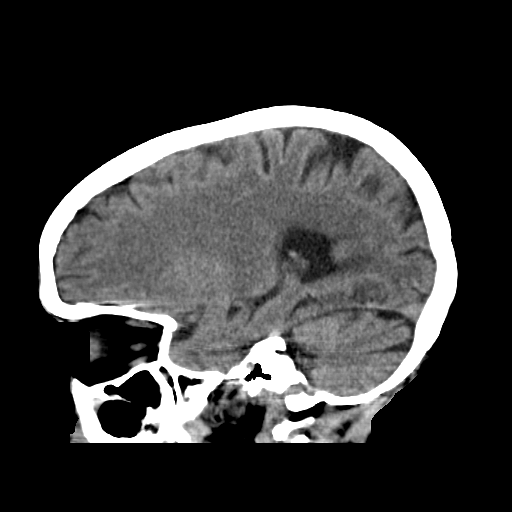
[im 24/48  brain]
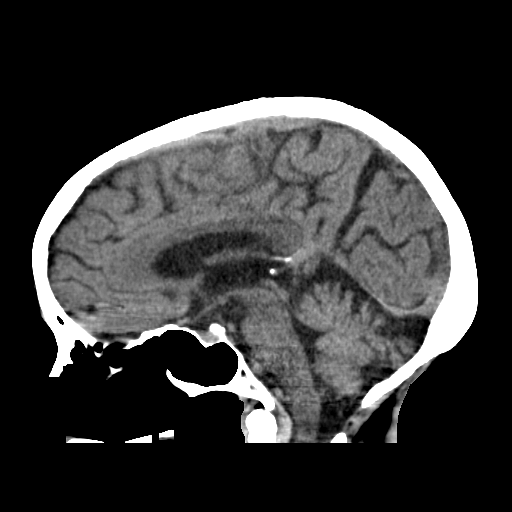
[im 32/48  brain]
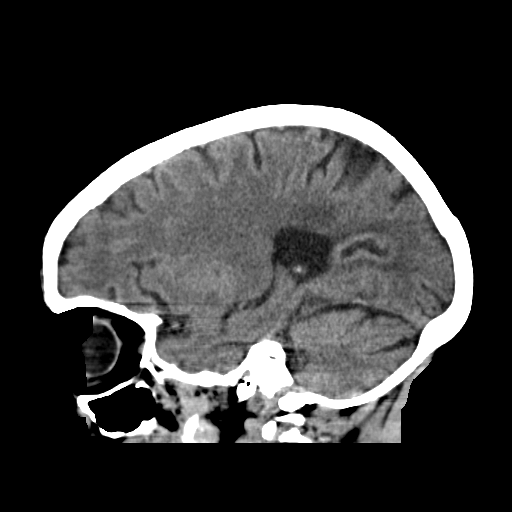

[16 of 47 positions shown; findings below may reference images not displayed]

FINDINGS: Brain: No intracranial hemorrhage or CT evidence of large acute
infarct.

Mild chronic microvascular changes.

Global atrophy without hydrocephalus.

No intracranial mass lesion noted on this unenhanced exam.

Vascular: Vascular calcifications.

Skull: Negative

Sinuses/Orbits: No acute orbital abnormality. Visualized paranasal
sinuses are clear.

Other: Mastoid air cells and middle ear cavities are clear.
IMPRESSION: No acute intracranial abnormality.

Mild chronic microvascular changes.

Global atrophy without hydrocephalus.
